# Patient Record
Sex: Male | Born: 1995 | Race: White | Hispanic: No | Marital: Single
Health system: Midwestern US, Community
[De-identification: ages and names within clinical notes are randomized; demographics above are authoritative.]

## PROBLEM LIST (undated history)

## (undated) DIAGNOSIS — F909 Attention-deficit hyperactivity disorder, unspecified type: Secondary | ICD-10-CM

## (undated) DIAGNOSIS — G43909 Migraine, unspecified, not intractable, without status migrainosus: Secondary | ICD-10-CM

## (undated) DIAGNOSIS — G039 Meningitis, unspecified: Secondary | ICD-10-CM

## (undated) DIAGNOSIS — K529 Noninfective gastroenteritis and colitis, unspecified: Secondary | ICD-10-CM

## (undated) DIAGNOSIS — F419 Anxiety disorder, unspecified: Secondary | ICD-10-CM

## (undated) DIAGNOSIS — I10 Essential (primary) hypertension: Secondary | ICD-10-CM

## (undated) DIAGNOSIS — M25512 Pain in left shoulder: Secondary | ICD-10-CM

## (undated) DIAGNOSIS — S0291XA Unspecified fracture of skull, initial encounter for closed fracture: Secondary | ICD-10-CM

## (undated) DIAGNOSIS — I609 Nontraumatic subarachnoid hemorrhage, unspecified: Secondary | ICD-10-CM

## (undated) HISTORY — DX: Nontraumatic subarachnoid hemorrhage, unspecified: I60.9

## (undated) HISTORY — PX: HERNIA REPAIR: SHX51

## (undated) HISTORY — DX: Noninfective gastroenteritis and colitis, unspecified: K52.9

## (undated) HISTORY — PX: CHOLECYSTECTOMY: SHX55

## (undated) HISTORY — DX: Anxiety disorder, unspecified: F41.9

## (undated) HISTORY — DX: Migraine, unspecified, not intractable, without status migrainosus: G43.909

## (undated) HISTORY — PX: APPENDECTOMY: SHX54

---
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
## 2015-04-12 ENCOUNTER — Encounter (HOSPITAL_COMMUNITY): Payer: Self-pay | Admitting: Emergency Medicine

## 2015-04-12 ENCOUNTER — Emergency Department (HOSPITAL_COMMUNITY): Payer: Medicaid Other

## 2015-04-12 ENCOUNTER — Emergency Department (HOSPITAL_COMMUNITY)
Admission: EM | Admit: 2015-04-12 | Discharge: 2015-04-12 | Disposition: A | Discharge status: Home / Self Care | Payer: Medicaid Other | Attending: Emergency Medicine | Admitting: Emergency Medicine

## 2015-04-12 DIAGNOSIS — R0789 Other chest pain: Secondary | ICD-10-CM | POA: Diagnosis not present

## 2015-04-12 DIAGNOSIS — Y929 Unspecified place or not applicable: Secondary | ICD-10-CM | POA: Diagnosis not present

## 2015-04-12 DIAGNOSIS — J45909 Unspecified asthma, uncomplicated: Secondary | ICD-10-CM | POA: Insufficient documentation

## 2015-04-12 DIAGNOSIS — Y939 Activity, unspecified: Secondary | ICD-10-CM | POA: Diagnosis not present

## 2015-04-12 DIAGNOSIS — I10 Essential (primary) hypertension: Secondary | ICD-10-CM | POA: Diagnosis not present

## 2015-04-12 DIAGNOSIS — Y999 Unspecified external cause status: Secondary | ICD-10-CM | POA: Insufficient documentation

## 2015-04-12 DIAGNOSIS — S299XXA Unspecified injury of thorax, initial encounter: Secondary | ICD-10-CM | POA: Diagnosis present

## 2015-04-12 LAB — D-DIMER, QUANTITATIVE: D-Dimer, Quant: <0.27 ug/mL-FEU (ref 0.00–0.50)

## 2015-04-12 MED ORDER — CYCLOBENZAPRINE HCL 10 MG PO TABS
10.0000 mg | ORAL_TABLET | Freq: Once | ORAL | Status: AC
Start: 2015-04-12 — End: 2015-04-12
  Administered 2015-04-12: 10 mg via ORAL
  Filled 2015-04-12: qty 1

## 2015-04-12 MED ORDER — NAPROXEN 500 MG PO TABS: 500.0000 mg | ORAL_TABLET | Freq: Two times a day (BID) | ORAL | Status: DC

## 2015-04-12 MED ORDER — TRAMADOL HCL 50 MG PO TABS: 50.0000 mg | ORAL_TABLET | Freq: Four times a day (QID) | ORAL | Status: DC | PRN

## 2015-04-12 MED ORDER — TRAMADOL HCL 50 MG PO TABS
50.0000 mg | ORAL_TABLET | Freq: Once | ORAL | Status: AC
  Administered 2015-04-12: 50 mg via ORAL
  Filled 2015-04-12: qty 1

## 2015-04-12 MED ORDER — CYCLOBENZAPRINE HCL 5 MG PO TABS: 5.0000 mg | ORAL_TABLET | Freq: Three times a day (TID) | ORAL | Status: DC | PRN

## 2015-04-12 NOTE — Discharge Instructions (Signed)
Chest Wall Pain Chest wall pain is pain in or around the bones and muscles of your chest. Sometimes, an injury causes this pain. Sometimes, the cause may not be known. This pain may take several weeks or longer to get better. HOME CARE INSTRUCTIONS  Pay attention to any changes in your symptoms. Take these actions to help with your pain:   Rest as told by your health care provider.   Avoid activities that cause pain. These include any activities that use your chest muscles or your abdominal and side muscles to lift heavy items.   If directed, apply ice to the painful area:  Put ice in a plastic bag.  Place a towel between your skin and the bag.  Leave the ice on for 20 minutes, 2-3 times per day.  Take over-the-counter and prescription medicines only as told by your health care provider.  Do not use tobacco products, including cigarettes, chewing tobacco, and e-cigarettes. If you need help quitting, ask your health care provider.  Keep all follow-up visits as told by your health care provider. This is important. SEEK MEDICAL CARE IF:  You have a fever.  Your chest pain becomes worse.  You have new symptoms. SEEK IMMEDIATE MEDICAL CARE IF:  You have nausea or vomiting.  You feel sweaty or light-headed.  You have a cough with phlegm (sputum) or you cough up blood.  You develop shortness of breath.   This information is not intended to replace advice given to you by your health care provider. Make sure you discuss any questions you have with your health care provider.   Document Released: 01/22/2005 Document Revised: 10/13/2014 Document Reviewed: 04/19/2014 Elsevier Interactive Patient Education 2016 [employer]   Use medications as prescribed.  Use caution with Ultram and Flexeril as these medicines and can make you sleepy.  Apply heating pad to chest wall 20 minutes 3-4 times daily.  Get rechecked by your primary doctor if this is not improving with today's  treatment.  Your x-rays and lab tests are normal today.

## 2015-04-12 NOTE — ED Provider Notes (Signed)
Medical screening examination/treatment/procedure(s) were performed by non-physician practitioner and as supervising physician I was immediately available for consultation/collaboration.   EKG Interpretation None     EKG:  Rhythm: SINUS TACHYCARDIA Rate: 132 PR:  132 ms QRS: 76 ms QTc: 432 ms ST segments: normal    Raeford RazorStephen Niomie Englert, MD 04/12/15 1208
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
## 2015-06-09 ENCOUNTER — Ambulatory Visit (INDEPENDENT_AMBULATORY_CARE_PROVIDER_SITE_OTHER): Payer: Medicaid Other | Admitting: Otolaryngology

## 2015-07-18 ENCOUNTER — Ambulatory Visit (INDEPENDENT_AMBULATORY_CARE_PROVIDER_SITE_OTHER): Payer: Medicaid Other | Admitting: Otolaryngology

## 2015-08-12 ENCOUNTER — Emergency Department (HOSPITAL_COMMUNITY): Payer: Medicaid Other

## 2015-08-12 ENCOUNTER — Encounter (HOSPITAL_COMMUNITY): Payer: Self-pay | Admitting: Emergency Medicine

## 2015-08-12 ENCOUNTER — Emergency Department (HOSPITAL_COMMUNITY)
Admission: EM | Admit: 2015-08-12 | Discharge: 2015-08-12 | Disposition: A | Discharge status: Home / Self Care | Payer: Medicaid Other | Attending: Emergency Medicine | Admitting: Emergency Medicine

## 2015-08-12 DIAGNOSIS — I10 Essential (primary) hypertension: Secondary | ICD-10-CM | POA: Diagnosis not present

## 2015-08-12 DIAGNOSIS — R0789 Other chest pain: Secondary | ICD-10-CM | POA: Diagnosis present

## 2015-08-12 DIAGNOSIS — J45909 Unspecified asthma, uncomplicated: Secondary | ICD-10-CM | POA: Diagnosis not present

## 2015-08-12 MED ORDER — IBUPROFEN 800 MG PO TABS
800.0000 mg | ORAL_TABLET | Freq: Once | ORAL | Status: AC
Start: 2015-08-12 — End: 2015-08-12
  Administered 2015-08-12: 800 mg via ORAL
  Filled 2015-08-12: qty 1

## 2015-08-12 MED ORDER — TRAMADOL HCL 50 MG PO TABS: 50.0000 mg | ORAL_TABLET | Freq: Four times a day (QID) | ORAL | Status: DC | PRN

## 2015-08-12 MED ORDER — NAPROXEN 500 MG PO TABS: 500.0000 mg | ORAL_TABLET | Freq: Two times a day (BID) | ORAL | Status: DC

## 2015-08-12 NOTE — ED Provider Notes (Signed)
CSN: [id number]     Arrival date & time 08/12/15  0301 History   None    Chief Complaint  Patient presents with  . Chest Pain     (Consider location/radiation/quality/duration/timing/severity/associated sxs/prior Treatment) Patient is a 20 y.o. male presenting with chest pain. The history is provided by the patient.  Chest Pain He has a history of asthma and hypertension. 2 days ago, he started complaining of pain in the right lateral rib cage. Pain is worse with palpation and with raising his right arm. He did not notice pain with deep breath. He denies any trauma or unusual activity. He had similar pain about 3 months ago which he was told was from inflamed cartilage. He applied ice which did give some slight relief. He has not taken any medication. He denies fever, chills, sweats. He denies cough. He denies dyspnea. Pain is currently rated at 8/10 although, pain was 10/10 yesterday.  Past Medical History  Diagnosis Date  . Asthma   . ADHD (attention deficit hyperactivity disorder)   . Left shoulder pain   . Meningitis   . Hypertension    Past Surgical History  Procedure Laterality Date  . Appendectomy    . Hernia repair     History reviewed. No pertinent family history. Social History  Substance Use Topics  . Smoking status: Never Smoker   . Smokeless tobacco: None  . Alcohol Use: No    Review of Systems  Cardiovascular: Positive for chest pain.  All other systems reviewed and are negative.     Allergies  Sulfa antibiotics  Home Medications   Prior to Admission medications   Medication Sig Start Date End Date Taking? Authorizing Provider  amoxicillin-clavulanate (AUGMENTIN) 875-125 MG per tablet Take 1 tablet by mouth 2 (two) times daily.    Historical Provider, MD  cephALEXin (KEFLEX) 500 MG capsule Take 500 mg by mouth 4 (four) times daily. Filled 09/27/12    Historical Provider, MD  cetirizine (ZYRTEC) 10 MG tablet Take 10 mg by mouth daily.    Historical  Provider, MD  cyclobenzaprine (FLEXERIL) 5 MG tablet Take 1 tablet (5 mg total) by mouth 3 (three) times daily as needed for muscle spasms. 04/12/15   Burgess AmorJulie Idol, PA-C  guanFACINE (INTUNIV) 2 MG TB24 Take 2 mg by mouth at bedtime.    Historical Provider, MD  HYDROcodone-acetaminophen (NORCO/VICODIN) 5-325 MG per tablet Take 1 tablet by mouth every 6 (six) hours as needed for moderate pain or severe pain. 10/01/13   Shon Batonourtney F Horton, MD  ibuprofen (ADVIL,MOTRIN) 200 MG tablet Take 600 mg by mouth every 6 (six) hours as needed. For pain    Historical Provider, MD  ibuprofen (ADVIL,MOTRIN) 600 MG tablet Take 600 mg by mouth every 6 (six) hours as needed for pain. Filled 09/27/12    Historical Provider, MD  ibuprofen (ADVIL,MOTRIN) 600 MG tablet Take 1 tablet (600 mg total) by mouth every 6 (six) hours as needed. 10/01/13   Shon Batonourtney F Horton, MD  lisinopril (PRINIVIL,ZESTRIL) 2.5 MG tablet Take 2.5 mg by mouth daily.    Historical Provider, MD  metoprolol (LOPRESSOR) 50 MG tablet Take 50 mg by mouth daily.    Historical Provider, MD  Multiple Vitamin (ONE-A-DAY MENS PO) Take 1 tablet by mouth daily.      Historical Provider, MD  naproxen (NAPROSYN) 500 MG tablet Take 1 tablet (500 mg total) by mouth 2 (two) times daily. 04/12/15   Burgess AmorJulie Idol, PA-C  traMADol (ULTRAM) 50 MG tablet Take  1 tablet (50 mg total) by mouth every 6 (six) hours as needed. 04/12/15   Burgess AmorJulie Idol, PA-C   BP 131/84 mmHg  Pulse 93  Temp(Src) 98.5 F (36.9 C) (Oral)  Resp 22  Ht 6' (1.829 m)  Wt 230 lb (104.327 kg)  BMI 31.19 kg/m2  SpO2 99% Physical Exam  Nursing note and vitals reviewed.  20 year old male, resting comfortably and in no acute distress. Vital signs are normal. Oxygen saturation is 99%, which is normal. Head is normocephalic and atraumatic. PERRLA, EOMI. Oropharynx is clear. Neck is nontender and supple without adenopathy or JVD. Back is nontender and there is no CVA tenderness. Lungs are clear without rales,  wheezes, or rhonchi. Chest is tender over the right lateral rib cage. Tenderness tracks along a single rib. There is no crepitus. Heart has regular rate and rhythm without murmur. Abdomen is soft, flat, nontender without masses or hepatosplenomegaly and peristalsis is normoactive. Extremities have no cyanosis or edema, full range of motion is present. Skin is warm and dry without rash. Neurologic: Mental status is normal, cranial nerves are intact, there are no motor or sensory deficits.  ED Course  Procedures (including critical care time)  Imaging Review Dg Ribs Unilateral W/chest Right  08/12/2015  CLINICAL DATA:  Acute onset of anterior and lateral right rib pain. Initial encounter. EXAM: RIGHT RIBS AND CHEST - 3+ VIEW COMPARISON:  Chest and right rib radiographs performed 04/12/2015 FINDINGS: No displaced rib fractures are seen. The lungs are well-aerated and clear. There is no evidence of focal opacification, pleural effusion or pneumothorax. The cardiomediastinal silhouette is within normal limits. No acute osseous abnormalities are seen. IMPRESSION: No acute cardiopulmonary process seen. No displaced rib fractures identified. Electronically Signed   By: Roanna RaiderJeffery  Chang M.D.   On: 08/12/2015 04:26   I have personally reviewed and evaluated these images as part of my medical decision-making.   EKG Interpretation   Date/Time:  Friday August 12 2015 03:13:52 EDT Ventricular Rate:  92 PR Interval:    QRS Duration: 91 QT Interval:  339 QTC Calculation: 420 R Axis:   69 Text Interpretation:  Sinus rhythm Normal ECG When compared with ECG of  04/12/2015, HEART RATE has decreased Confirmed by [facility]  MD, Jaxsin Bottomley ([id number])  on 08/12/2015 3:16:51 AM      MDM   Final diagnoses:  Right-sided chest wall pain    Chest wall pain. Old records are reviewed and he does have a prior ED visit for chest wall pain. He will be sent for rib x-rays.  X-rays are negative for fracture. He is discharged with  prescription for naproxen and an tramadol.  Dione Boozeavid Trenna Kiely, MD 08/12/15 [phone]

## 2015-08-12 NOTE — ED Notes (Signed)
EKG given to MD Preston FleetingGlick.

## 2015-08-12 NOTE — Discharge Instructions (Signed)
Chest Wall Pain Chest wall pain is pain in or around the bones and muscles of your chest. Sometimes, an injury causes this pain. Sometimes, the cause may not be known. This pain may take several weeks or longer to get better. HOME CARE INSTRUCTIONS  Pay attention to any changes in your symptoms. Take these actions to help with your pain:   Rest as told by your health care provider.   Avoid activities that cause pain. These include any activities that use your chest muscles or your abdominal and side muscles to lift heavy items.   If directed, apply ice to the painful area:  Put ice in a plastic bag.  Place a towel between your skin and the bag.  Leave the ice on for 20 minutes, 2-3 times per day.  Take over-the-counter and prescription medicines only as told by your health care provider.  Do not use tobacco products, including cigarettes, chewing tobacco, and e-cigarettes. If you need help quitting, ask your health care provider.  Keep all follow-up visits as told by your health care provider. This is important. SEEK MEDICAL CARE IF:  You have a fever.  Your chest pain becomes worse.  You have new symptoms. SEEK IMMEDIATE MEDICAL CARE IF:  You have nausea or vomiting.  You feel sweaty or light-headed.  You have a cough with phlegm (sputum) or you cough up blood.  You develop shortness of breath.   This information is not intended to replace advice given to you by your health care provider. Make sure you discuss any questions you have with your health care provider.   Document Released: 01/22/2005 Document Revised: 10/13/2014 Document Reviewed: 04/19/2014 Elsevier Interactive Patient Education 2016 [employer]  Naproxen and naproxen sodium oral immediate-release tablets What is this medicine? NAPROXEN (na PROX en) is a non-steroidal anti-inflammatory drug (NSAID). It is used to reduce swelling and to treat pain. This medicine may be used for dental pain, headache,  or painful monthly periods. It is also used for painful joint and muscular problems such as arthritis, tendinitis, bursitis, and gout. This medicine may be used for other purposes; ask your health care provider or pharmacist if you have questions. What should I tell my health care provider before I take this medicine? They need to know if you have any of these conditions: -asthma -cigarette smoker -drink more than 3 alcohol containing drinks a day -heart disease or circulation problems such as heart failure or leg edema (fluid retention) -high blood pressure -kidney disease -liver disease -stomach bleeding or ulcers -an unusual or allergic reaction to naproxen, aspirin, other NSAIDs, other medicines, foods, dyes, or preservatives -pregnant or trying to get pregnant -breast-feeding How should I use this medicine? Take this medicine by mouth with a glass of water. Follow the directions on the prescription label. Take it with food if your stomach gets upset. Try to not lie down for at least 10 minutes after you take it. Take your medicine at regular intervals. Do not take your medicine more often than directed. Long-term, continuous use may increase the risk of heart attack or stroke. A special MedGuide will be given to you by the pharmacist with each prescription and refill. Be sure to read this information carefully each time. Talk to your pediatrician regarding the use of this medicine in children. Special care may be needed. Overdosage: If you think you have taken too much of this medicine contact a poison control center or emergency room at once. NOTE: This medicine is  only for you. Do not share this medicine with others. What if I miss a dose? If you miss a dose, take it as soon as you can. If it is almost time for your next dose, take only that dose. Do not take double or extra doses. What may interact with this  medicine? -alcohol -aspirin -cidofovir -diuretics -lithium -methotrexate -other drugs for inflammation like ketorolac or prednisone -pemetrexed -probenecid -warfarin This list may not describe all possible interactions. Give your health care provider a list of all the medicines, herbs, non-prescription drugs, or dietary supplements you use. Also tell them if you smoke, drink alcohol, or use illegal drugs. Some items may interact with your medicine. What should I watch for while using this medicine? Tell your doctor or health care professional if your pain does not get better. Talk to your doctor before taking another medicine for pain. Do not treat yourself. This medicine does not prevent heart attack or stroke. In fact, this medicine may increase the chance of a heart attack or stroke. The chance may increase with longer use of this medicine and in people who have heart disease. If you take aspirin to prevent heart attack or stroke, talk with your doctor or health care professional. Do not take other medicines that contain aspirin, ibuprofen, or naproxen with this medicine. Side effects such as stomach upset, nausea, or ulcers may be more likely to occur. Many medicines available without a prescription should not be taken with this medicine. This medicine can cause ulcers and bleeding in the stomach and intestines at any time during treatment. Do not smoke cigarettes or drink alcohol. These increase irritation to your stomach and can make it more susceptible to damage from this medicine. Ulcers and bleeding can happen without warning symptoms and can cause death. You may get drowsy or dizzy. Do not drive, use machinery, or do anything that needs mental alertness until you know how this medicine affects you. Do not stand or sit up quickly, especially if you are an older patient. This reduces the risk of dizzy or fainting spells. This medicine can cause you to bleed more easily. Try to avoid damage  to your teeth and gums when you brush or floss your teeth. What side effects may I notice from receiving this medicine? Side effects that you should report to your doctor or health care professional as soon as possible: -black or bloody stools, blood in the urine or vomit -blurred vision -chest pain -difficulty breathing or wheezing -nausea or vomiting -severe stomach pain -skin rash, skin redness, blistering or peeling skin, hives, or itching -slurred speech or weakness on one side of the body -swelling of eyelids, throat, lips -unexplained weight gain or swelling -unusually weak or tired -yellowing of eyes or skin Side effects that usually do not require medical attention (report to your doctor or health care professional if they continue or are bothersome): -constipation -headache -heartburn This list may not describe all possible side effects. Call your doctor for medical advice about side effects. You may report side effects to FDA at 1-800-FDA-1088. Where should I keep my medicine? Keep out of the reach of children. Store at room temperature between 15 and 30 degrees C (59 and 86 degrees F). Keep container tightly closed. Throw away any unused medicine after the expiration date. NOTE: This sheet is a summary. It may not cover all possible information. If you have questions about this medicine, talk to your doctor, pharmacist, or health care provider.  2016, Elsevier/Gold Standard. (2009-01-24 20:10:16)  Tramadol tablets What is this medicine? TRAMADOL (TRA ma dole) is a pain reliever. It is used to treat moderate to severe pain in adults. This medicine may be used for other purposes; ask your health care provider or pharmacist if you have questions. What should I tell my health care provider before I take this medicine? They need to know if you have any of these conditions: -brain tumor -depression -drug abuse or addiction -head injury -if you frequently drink alcohol  containing drinks -kidney disease or trouble passing urine -liver disease -lung disease, asthma, or breathing problems -seizures or epilepsy -suicidal thoughts, plans, or attempt; a previous suicide attempt by you or a family member -an unusual or allergic reaction to tramadol, codeine, other medicines, foods, dyes, or preservatives -pregnant or trying to get pregnant -breast-feeding How should I use this medicine? Take this medicine by mouth with a full glass of water. Follow the directions on the prescription label. If the medicine upsets your stomach, take it with food or milk. Do not take more medicine than you are told to take. Talk to your pediatrician regarding the use of this medicine in children. Special care may be needed. Overdosage: If you think you have taken too much of this medicine contact a poison control center or emergency room at once. NOTE: This medicine is only for you. Do not share this medicine with others. What if I miss a dose? If you miss a dose, take it as soon as you can. If it is almost time for your next dose, take only that dose. Do not take double or extra doses. What may interact with this medicine? Do not take this medicine with any of the following medications: -MAOIs like Carbex, Eldepryl, Marplan, Nardil, and Parnate This medicine may also interact with the following medications: -alcohol or medicines that contain alcohol -antihistamines -benzodiazepines -bupropion -carbamazepine or oxcarbazepine -clozapine -cyclobenzaprine -digoxin -furazolidone -linezolid -medicines for depression, anxiety, or psychotic disturbances -medicines for migraine headache like almotriptan, eletriptan, frovatriptan, naratriptan, rizatriptan, sumatriptan, zolmitriptan -medicines for pain like pentazocine, buprenorphine, butorphanol, meperidine, nalbuphine, and propoxyphene -medicines for sleep -muscle relaxants -naltrexone -phenobarbital -phenothiazines like  perphenazine, thioridazine, chlorpromazine, mesoridazine, fluphenazine, prochlorperazine, promazine, and trifluoperazine -procarbazine -warfarin This list may not describe all possible interactions. Give your health care provider a list of all the medicines, herbs, non-prescription drugs, or dietary supplements you use. Also tell them if you smoke, drink alcohol, or use illegal drugs. Some items may interact with your medicine. What should I watch for while using this medicine? Tell your doctor or health care professional if your pain does not go away, if it gets worse, or if you have new or a different type of pain. You may develop tolerance to the medicine. Tolerance means that you will need a higher dose of the medicine for pain relief. Tolerance is normal and is expected if you take this medicine for a long time. Do not suddenly stop taking your medicine because you may develop a severe reaction. Your body becomes used to the medicine. This does NOT mean you are addicted. Addiction is a behavior related to getting and using a drug for a non-medical reason. If you have pain, you have a medical reason to take pain medicine. Your doctor will tell you how much medicine to take. If your doctor wants you to stop the medicine, the dose will be slowly lowered over time to avoid any side effects. You may get drowsy or dizzy. Do  not drive, use machinery, or do anything that needs mental alertness until you know how this medicine affects you. Do not stand or sit up quickly, especially if you are an older patient. This reduces the risk of dizzy or fainting spells. Alcohol can increase or decrease the effects of this medicine. Avoid alcoholic drinks. You may have constipation. Try to have a bowel movement at least every 2 to 3 days. If you do not have a bowel movement for 3 days, call your doctor or health care professional. Your mouth may get dry. Chewing sugarless gum or sucking hard candy, and drinking plenty of  water may help. Contact your doctor if the problem does not go away or is severe. What side effects may I notice from receiving this medicine? Side effects that you should report to your doctor or health care professional as soon as possible: -allergic reactions like skin rash, itching or hives, swelling of the face, lips, or tongue -breathing difficulties, wheezing -confusion -itching -light headedness or fainting spells -redness, blistering, peeling or loosening of the skin, including inside the mouth -seizures Side effects that usually do not require medical attention (report to your doctor or health care professional if they continue or are bothersome): -constipation -dizziness -drowsiness -headache -nausea, vomiting This list may not describe all possible side effects. Call your doctor for medical advice about side effects. You may report side effects to FDA at 1-800-FDA-1088. Where should I keep my medicine? Keep out of the reach of children. This medicine may cause accidental overdose and death if it taken by other adults, children, or pets. Mix any unused medicine with a substance like cat litter or coffee grounds. Then throw the medicine away in a sealed container like a sealed bag or a coffee can with a lid. Do not use the medicine after the expiration date. Store at room temperature between 15 and 30 degrees C (59 and 86 degrees F). NOTE: This sheet is a summary. It may not cover all possible information. If you have questions about this medicine, talk to your doctor, pharmacist, or health care provider.    2016, Elsevier/Gold Standard. (2013-03-20 15:42:09)

## 2015-08-12 NOTE — ED Notes (Signed)
Pt reports right side rib cage pain, stated right side CP earlier, denies SOB. States he was seen for the same sx several months ago.

## 2015-09-07 ENCOUNTER — Emergency Department (HOSPITAL_COMMUNITY): Payer: Medicaid Other

## 2015-09-07 ENCOUNTER — Emergency Department (HOSPITAL_COMMUNITY)
Admission: EM | Admit: 2015-09-07 | Discharge: 2015-09-07 | Disposition: A | Discharge status: Home / Self Care | Payer: Medicaid Other | Attending: Emergency Medicine | Admitting: Emergency Medicine

## 2015-09-07 ENCOUNTER — Encounter (HOSPITAL_COMMUNITY): Payer: Self-pay | Admitting: Emergency Medicine

## 2015-09-07 DIAGNOSIS — R05 Cough: Secondary | ICD-10-CM | POA: Insufficient documentation

## 2015-09-07 DIAGNOSIS — J45909 Unspecified asthma, uncomplicated: Secondary | ICD-10-CM | POA: Insufficient documentation

## 2015-09-07 DIAGNOSIS — I10 Essential (primary) hypertension: Secondary | ICD-10-CM | POA: Insufficient documentation

## 2015-09-07 DIAGNOSIS — Z79899 Other long term (current) drug therapy: Secondary | ICD-10-CM | POA: Insufficient documentation

## 2015-09-07 DIAGNOSIS — R059 Cough, unspecified: Secondary | ICD-10-CM

## 2015-09-07 MED ORDER — BENZONATATE 100 MG PO CAPS: 100.0000 mg | ORAL_CAPSULE | Freq: Three times a day (TID) | ORAL | 0 refills | Status: DC | PRN

## 2015-09-07 MED ORDER — PREDNISONE 20 MG PO TABS: 40.0000 mg | ORAL_TABLET | Freq: Every day | ORAL | 0 refills | Status: DC

## 2015-09-07 MED ORDER — ALBUTEROL SULFATE HFA 108 (90 BASE) MCG/ACT IN AERS: 2.0000 | INHALATION_SPRAY | RESPIRATORY_TRACT | 0 refills | Status: DC | PRN

## 2015-09-07 NOTE — ED Triage Notes (Signed)
Pt c/o cough and congestion x 2 weeks. Pt has been seen at [facility] for the same.

## 2015-09-07 NOTE — Discharge Instructions (Signed)
Take the prescriptions as directed.  Use your albuterol inhaler (2 to 4 puffs) every 4 hours for the next 7 days, then as needed for cough, wheezing, or shortness of breath.  Call your regular medical doctor tomorrow morning to schedule a follow up appointment within the next 3 days.  Return to the Emergency Department immediately sooner if worsening.  ° °

## 2015-09-07 NOTE — ED Provider Notes (Signed)
[facility]-EMERGENCY DEPT Provider Note   CSN: [id number] Arrival date & time: 09/07/15  1935  First Provider Contact:  None       History   Chief Complaint Chief Complaint  Patient presents with  . Cough    HPI Logan Dawson is a 20 y.o. male.  HPI  Pt was seen at 1956.  Per pt, c/o gradual onset and persistence of constant runny/stuffy nose, sinus congestion, and cough for the past 2 weeks. States he has been evaluated at [facility] ED for same, dx URI, rx "cough syrup." Pt states "it's not working."  Denies sore throat, no fevers, no rash, no CP/SOB, no N/V/D, no abd pain.    Past Medical History:  Diagnosis Date  . ADHD (attention deficit hyperactivity disorder)   . Asthma   . Hypertension   . Left shoulder pain   . Meningitis     There are no active problems to display for this patient.   Past Surgical History:  Procedure Laterality Date  . APPENDECTOMY    . HERNIA REPAIR         Home Medications    Prior to Admission medications   Medication Sig Start Date End Date Taking? Authorizing Provider  amoxicillin-clavulanate (AUGMENTIN) 875-125 MG per tablet Take 1 tablet by mouth 2 (two) times daily.    Historical Provider, MD  cephALEXin (KEFLEX) 500 MG capsule Take 500 mg by mouth 4 (four) times daily. Filled 09/27/12    Historical Provider, MD  cetirizine (ZYRTEC) 10 MG tablet Take 10 mg by mouth daily.    Historical Provider, MD  cyclobenzaprine (FLEXERIL) 5 MG tablet Take 1 tablet (5 mg total) by mouth 3 (three) times daily as needed for muscle spasms. 04/12/15   Burgess Amor, PA-C  guanFACINE (INTUNIV) 2 MG TB24 Take 2 mg by mouth at bedtime.    Historical Provider, MD  HYDROcodone-acetaminophen (NORCO/VICODIN) 5-325 MG per tablet Take 1 tablet by mouth every 6 (six) hours as needed for moderate pain or severe pain. 10/01/13   Shon Baton, MD  ibuprofen (ADVIL,MOTRIN) 200 MG tablet Take 600 mg by mouth every 6 (six) hours as needed. For pain    Historical  Provider, MD  ibuprofen (ADVIL,MOTRIN) 600 MG tablet Take 600 mg by mouth every 6 (six) hours as needed for pain. Filled 09/27/12    Historical Provider, MD  ibuprofen (ADVIL,MOTRIN) 600 MG tablet Take 1 tablet (600 mg total) by mouth every 6 (six) hours as needed. 10/01/13   Shon Baton, MD  lisinopril (PRINIVIL,ZESTRIL) 2.5 MG tablet Take 2.5 mg by mouth daily.    Historical Provider, MD  metoprolol (LOPRESSOR) 50 MG tablet Take 50 mg by mouth daily.    Historical Provider, MD  Multiple Vitamin (ONE-A-DAY MENS PO) Take 1 tablet by mouth daily.      Historical Provider, MD  naproxen (NAPROSYN) 500 MG tablet Take 1 tablet (500 mg total) by mouth 2 (two) times daily. 08/12/15   Dione Booze, MD  traMADol (ULTRAM) 50 MG tablet Take 1 tablet (50 mg total) by mouth every 6 (six) hours as needed. 08/12/15   Dione Booze, MD    Family History   Social History Social History  Substance Use Topics  . Smoking status: Never Smoker  . Smokeless tobacco: Never Used  . Alcohol use Yes     Allergies   Sulfa antibiotics   Review of Systems Review of Systems ROS: Statement: All systems negative except as marked or noted in the  HPI; Constitutional: Negative for fever and chills. ; ; Eyes: Negative for eye pain, redness and discharge. ; ; ENMT: Negative for ear pain, hoarseness, sore throat. +nasal congestion, rhinorrhea, sinus pressure. ; ; Cardiovascular: Negative for chest pain, palpitations, diaphoresis, dyspnea and peripheral edema. ; ; Respiratory: +cough. Negative for wheezing and stridor. ; ; Gastrointestinal: Negative for nausea, vomiting, diarrhea, abdominal pain, blood in stool, hematemesis, jaundice and rectal bleeding. . ; ; Genitourinary: Negative for dysuria, flank pain and hematuria. ; ; Musculoskeletal: Negative for back pain and neck pain. Negative for swelling and trauma.; ; Skin: Negative for pruritus, rash, abrasions, blisters, bruising and skin lesion.; ; Neuro: Negative for headache,  lightheadedness and neck stiffness. Negative for weakness, altered level of consciousness, altered mental status, extremity weakness, paresthesias, involuntary movement, seizure and syncope.       Physical Exam Updated Vital Signs BP 131/79 (BP Location: Left Arm)   Pulse 89   Temp 98.2 F (36.8 C) (Oral)   Resp 20   Ht 6' (1.829 m)   Wt 226 lb (102.5 kg)   SpO2 98%   BMI 30.65 kg/m   Physical Exam 2000: Physical examination:  Nursing notes reviewed; Vital signs and O2 SAT reviewed;  Constitutional: Well developed, Well nourished, Well hydrated, In no acute distress; Head:  Normocephalic, atraumatic; Eyes: EOMI, PERRL, No scleral icterus; ENMT: TM's clear bilat. +edemetous nasal turbinates bilat with clear rhinorrhea. Mouth and pharynx without lesions. No tonsillar exudates. No intra-oral edema. No submandibular or sublingual edema. No hoarse voice, no drooling, no stridor. No pain with manipulation of larynx. No trismus. Mouth and pharynx normal, Mucous membranes moist; Neck: Supple, Full range of motion, No lymphadenopathy; Cardiovascular: Regular rate and rhythm, No gallop; Respiratory: Breath sounds diminished & equal bilaterally, No wheezes.  Speaking full sentences with ease, Normal respiratory effort/excursion; Chest: Nontender, Movement normal; Abdomen: Soft, Nontender, Nondistended, Normal bowel sounds; Genitourinary: No CVA tenderness; Extremities: Pulses normal, No tenderness, No edema, No calf edema or asymmetry.; Neuro: AA&Ox3, Major CN grossly intact.  Speech clear. No gross focal motor or sensory deficits in extremities.; Skin: Color normal, Warm, Dry.   ED Treatments / Results  Labs (all labs ordered are listed, but only abnormal results are displayed)   EKG  EKG Interpretation None       Radiology   Procedures Procedures (including critical care time)  Medications Ordered in ED Medications - No data to display   Initial Impression / Assessment and Plan /  ED Course  I have reviewed the triage vital signs and the nursing notes.  Pertinent labs & imaging results that were available during my care of the patient were reviewed by me and considered in my medical decision making (see chart for details).  MDM Reviewed: previous chart, nursing note and vitals Interpretation: x-ray   Dg Chest 2 View Result Date: 09/07/2015 CLINICAL DATA:  20 year old male with cough and congestion for 2 weeks. Subsequent encounter. EXAM: CHEST  2 VIEW COMPARISON:  Chest radiograph 08/12/2015 and earlier. FINDINGS: Stable lung volumes, low normal. Normal cardiac size and mediastinal contours. Visualized tracheal air column is within normal limits. The lungs are clear. No pneumothorax or pleural effusion. Incidental air-fluid level in the stomach. Negative visible bowel gas pattern. No acute osseous abnormality identified. IMPRESSION: Negative, no acute cardiopulmonary abnormality. Electronically Signed   By: Odessa Fleming M.D.   On: 09/07/2015 20:15    2020:  Tx symptomatically at this time. Dx and testing d/w pt.  Questions answered.  Verb understanding, agreeable to d/c home with outpt f/u.   Final Clinical Impressions(s) / ED Diagnoses   Final diagnoses:  None    New Prescriptions New Prescriptions   No medications on file     Samuel Jester, DO 09/11/15 1403

## 2015-12-27 ENCOUNTER — Encounter (HOSPITAL_COMMUNITY): Payer: Self-pay | Admitting: Emergency Medicine

## 2015-12-27 ENCOUNTER — Emergency Department (HOSPITAL_COMMUNITY): Payer: Self-pay

## 2015-12-27 ENCOUNTER — Emergency Department (HOSPITAL_COMMUNITY)
Admission: EM | Admit: 2015-12-27 | Discharge: 2015-12-27 | Disposition: A | Discharge status: Home / Self Care | Payer: Self-pay | Attending: Emergency Medicine | Admitting: Emergency Medicine

## 2015-12-27 DIAGNOSIS — I10 Essential (primary) hypertension: Secondary | ICD-10-CM | POA: Insufficient documentation

## 2015-12-27 DIAGNOSIS — F909 Attention-deficit hyperactivity disorder, unspecified type: Secondary | ICD-10-CM | POA: Insufficient documentation

## 2015-12-27 DIAGNOSIS — M7551 Bursitis of right shoulder: Secondary | ICD-10-CM | POA: Insufficient documentation

## 2015-12-27 DIAGNOSIS — Z79899 Other long term (current) drug therapy: Secondary | ICD-10-CM | POA: Insufficient documentation

## 2015-12-27 DIAGNOSIS — Y990 Civilian activity done for income or pay: Secondary | ICD-10-CM | POA: Insufficient documentation

## 2015-12-27 DIAGNOSIS — J45909 Unspecified asthma, uncomplicated: Secondary | ICD-10-CM | POA: Insufficient documentation

## 2015-12-27 DIAGNOSIS — Y9389 Activity, other specified: Secondary | ICD-10-CM | POA: Insufficient documentation

## 2015-12-27 DIAGNOSIS — X500XXA Overexertion from strenuous movement or load, initial encounter: Secondary | ICD-10-CM | POA: Insufficient documentation

## 2015-12-27 DIAGNOSIS — Y929 Unspecified place or not applicable: Secondary | ICD-10-CM | POA: Insufficient documentation

## 2015-12-27 MED ORDER — CYCLOBENZAPRINE HCL 10 MG PO TABS
10.0000 mg | ORAL_TABLET | Freq: Once | ORAL | Status: AC
  Administered 2015-12-27: 10 mg via ORAL
  Filled 2015-12-27: qty 1

## 2015-12-27 MED ORDER — HYDROCODONE-ACETAMINOPHEN 5-325 MG PO TABS
1.0000 | ORAL_TABLET | Freq: Once | ORAL | Status: AC
  Administered 2015-12-27: 1 via ORAL
  Filled 2015-12-27: qty 1

## 2015-12-27 MED ORDER — CYCLOBENZAPRINE HCL 10 MG PO TABS: 10.0000 mg | ORAL_TABLET | Freq: Three times a day (TID) | ORAL | 0 refills | Status: DC | PRN

## 2015-12-27 MED ORDER — HYDROCODONE-ACETAMINOPHEN 5-325 MG PO TABS: ORAL_TABLET | ORAL | 0 refills | Status: DC

## 2015-12-27 NOTE — Discharge Instructions (Signed)
Apply ice packs on/off to your shoulder.  Continue taking your ibuprofen as directed.  Call the orthopedic doctor listed to arrange a follow-up appt in one week if not improving

## 2015-12-27 NOTE — ED Triage Notes (Signed)
Pt states he began having shoulder pain "about 4 months ago". States that he cannot lift/push/pull anything without excruciating pain. Pt states this is not due to a recent injury that he is aware of.

## 2015-12-27 NOTE — ED Provider Notes (Signed)
[facility]-EMERGENCY DEPT Provider Note   CSN: [id number] Arrival date & time: 12/27/15  2120     History   Chief Complaint Chief Complaint  Patient presents with  . Shoulder Pain    HPI Logan Dawson is a 20 y.o. male.  HPI   Logan Dawson is a 20 y.o. male who presents to the Emergency Department complaining of gradual onset of right shoulder pain.  Onset 4 days ago. He describes a sharp pain associated with movement, primarily reaching or lifting with the right arm.  Pain resolves at rest.  He has been doing a new repetitive movement at his job recently and carrying a heavy weight resting it on his right shoulder.  He has tried OTC analgesics, ice and heat without relief.  He denies chest pain, shortness of breath, neck pain, numbness or weakness of the extremity.     Past Medical History:  Diagnosis Date  . ADHD (attention deficit hyperactivity disorder)   . Asthma   . Hypertension   . Left shoulder pain   . Meningitis     There are no active problems to display for this patient.   Past Surgical History:  Procedure Laterality Date  . APPENDECTOMY    . HERNIA REPAIR         Home Medications    Prior to Admission medications   Medication Sig Start Date End Date Taking? Authorizing Provider  albuterol (PROVENTIL HFA;VENTOLIN HFA) 108 (90 Base) MCG/ACT inhaler Inhale 2 puffs into the lungs every 4 (four) hours as needed for wheezing or shortness of breath. 09/07/15   Samuel JesterKathleen McManus, DO  amoxicillin-clavulanate (AUGMENTIN) 875-125 MG per tablet Take 1 tablet by mouth 2 (two) times daily.    Historical Provider, MD  benzonatate (TESSALON) 100 MG capsule Take 1 capsule (100 mg total) by mouth 3 (three) times daily as needed for cough. 09/07/15   Samuel JesterKathleen McManus, DO  cephALEXin (KEFLEX) 500 MG capsule Take 500 mg by mouth 4 (four) times daily. Filled 09/27/12    Historical Provider, MD  cetirizine (ZYRTEC) 10 MG tablet Take 10 mg by mouth daily.    Historical Provider, MD    cyclobenzaprine (FLEXERIL) 5 MG tablet Take 1 tablet (5 mg total) by mouth 3 (three) times daily as needed for muscle spasms. 04/12/15   Burgess AmorJulie Idol, PA-C  guanFACINE (INTUNIV) 2 MG TB24 Take 2 mg by mouth at bedtime.    Historical Provider, MD  HYDROcodone-acetaminophen (NORCO/VICODIN) 5-325 MG per tablet Take 1 tablet by mouth every 6 (six) hours as needed for moderate pain or severe pain. 10/01/13   Shon Batonourtney F Horton, MD  ibuprofen (ADVIL,MOTRIN) 200 MG tablet Take 600 mg by mouth every 6 (six) hours as needed. For pain    Historical Provider, MD  ibuprofen (ADVIL,MOTRIN) 600 MG tablet Take 600 mg by mouth every 6 (six) hours as needed for pain. Filled 09/27/12    Historical Provider, MD  ibuprofen (ADVIL,MOTRIN) 600 MG tablet Take 1 tablet (600 mg total) by mouth every 6 (six) hours as needed. 10/01/13   Shon Batonourtney F Horton, MD  lisinopril (PRINIVIL,ZESTRIL) 2.5 MG tablet Take 2.5 mg by mouth daily.    Historical Provider, MD  metoprolol (LOPRESSOR) 50 MG tablet Take 50 mg by mouth daily.    Historical Provider, MD  Multiple Vitamin (ONE-A-DAY MENS PO) Take 1 tablet by mouth daily.      Historical Provider, MD  naproxen (NAPROSYN) 500 MG tablet Take 1 tablet (500 mg total) by mouth 2 (  two) times daily. 08/12/15   Dione Boozeavid Glick, MD  predniSONE (DELTASONE) 20 MG tablet Take 2 tablets (40 mg total) by mouth daily. 09/07/15   Samuel JesterKathleen McManus, DO  traMADol (ULTRAM) 50 MG tablet Take 1 tablet (50 mg total) by mouth every 6 (six) hours as needed. 08/12/15   Dione Boozeavid Glick, MD    Family History No family history on file.  Social History Social History  Substance Use Topics  . Smoking status: Never Smoker  . Smokeless tobacco: Never Used  . Alcohol use Yes     Allergies   Sulfa antibiotics   Review of Systems Review of Systems  Constitutional: Negative for chills and fever.  Respiratory: Negative for chest tightness and shortness of breath.   Cardiovascular: Negative for chest pain.  Musculoskeletal:  Positive for arthralgias (right shoulde rpain). Negative for joint swelling and neck pain.  Skin: Negative for color change and wound.  Neurological: Negative for weakness, numbness and headaches.  All other systems reviewed and are negative.    Physical Exam Updated Vital Signs BP 134/92 (BP Location: Left Arm)   Pulse 101   Temp 97.6 F (36.4 C) (Oral)   Resp 19   Ht 6' (1.829 m)   Wt 102.1 kg   SpO2 97%   BMI 30.52 kg/m   Physical Exam  Constitutional: He appears well-developed and well-nourished. No distress.  HENT:  Head: Atraumatic.  Neck: Normal range of motion. Neck supple.  Cardiovascular: Normal rate, regular rhythm and intact distal pulses.   Pulmonary/Chest: Effort normal. No respiratory distress. He exhibits no tenderness.  Musculoskeletal: He exhibits tenderness. He exhibits no edema.       Right shoulder: He exhibits tenderness. He exhibits no bony tenderness, no swelling, no effusion, no deformity, normal pulse and normal strength.       Arms: ttp of the anterior right shoulder.  limited abduction of the arm due to level of pain.  No erythema or edema.  Grip strength is strong and symmetrical.  No tenderness of the C spine  Neurological: He is alert. No sensory deficit.  Skin: Skin is warm and dry. No rash noted.  Nursing note and vitals reviewed.    ED Treatments / Results  Labs (all labs ordered are listed, but only abnormal results are displayed) Labs Reviewed - No data to display  EKG  EKG Interpretation None       Radiology No results found.  Procedures Procedures (including critical care time)  Medications Ordered in ED Medications  cyclobenzaprine (FLEXERIL) tablet 10 mg (not administered)  HYDROcodone-acetaminophen (NORCO/VICODIN) 5-325 MG per tablet 1 tablet (not administered)     Initial Impression / Assessment and Plan / ED Course  I have reviewed the triage vital signs and the nursing notes.  Pertinent labs & imaging results  that were available during my care of the patient were reviewed by me and considered in my medical decision making (see chart for details).  Clinical Course     Pain to right shoulder with onset after repetitive movements.  No concerning sx's for infectious process.  NV intact. Likely inflammatory   Pt agrees to symptomatic tx and orthopedic f/u if needed.   Final Clinical Impressions(s) / ED Diagnoses   Final diagnoses:  Bursitis of right shoulder    New Prescriptions New Prescriptions   No medications on file     Rosey Bathammy Vera Furniss, PA-C 12/27/15 2333    Mancel BaleElliott Wentz, MD 12/27/15 [phone]

## 2016-05-09 ENCOUNTER — Emergency Department (HOSPITAL_COMMUNITY)
Admission: EM | Admit: 2016-05-09 | Discharge: 2016-05-09 | Disposition: A | Discharge status: Home / Self Care | Payer: Medicaid Other | Attending: Dermatology | Admitting: Dermatology

## 2016-05-09 ENCOUNTER — Encounter (HOSPITAL_COMMUNITY): Payer: Self-pay | Admitting: *Deleted

## 2016-05-09 DIAGNOSIS — Z5321 Procedure and treatment not carried out due to patient leaving prior to being seen by health care provider: Secondary | ICD-10-CM | POA: Insufficient documentation

## 2016-05-09 DIAGNOSIS — Z79899 Other long term (current) drug therapy: Secondary | ICD-10-CM | POA: Insufficient documentation

## 2016-05-09 DIAGNOSIS — R109 Unspecified abdominal pain: Secondary | ICD-10-CM | POA: Insufficient documentation

## 2016-05-09 DIAGNOSIS — F909 Attention-deficit hyperactivity disorder, unspecified type: Secondary | ICD-10-CM | POA: Insufficient documentation

## 2016-05-09 DIAGNOSIS — J45909 Unspecified asthma, uncomplicated: Secondary | ICD-10-CM | POA: Insufficient documentation

## 2016-05-09 DIAGNOSIS — I10 Essential (primary) hypertension: Secondary | ICD-10-CM | POA: Insufficient documentation

## 2016-05-09 LAB — CBC
HCT: 48.1 % (ref 39.0–52.0)
Hemoglobin: 17.5 g/dL — ABNORMAL HIGH (ref 13.0–17.0)
MCH: 31.6 pg (ref 26.0–34.0)
MCHC: 36.4 g/dL — ABNORMAL HIGH (ref 30.0–36.0)
MCV: 86.8 fL (ref 78.0–100.0)
Platelets: 263 10*3/uL (ref 150–400)
RBC: 5.54 MIL/uL (ref 4.22–5.81)
RDW: 12.6 % (ref 11.5–15.5)
WBC: 11.7 10*3/uL — ABNORMAL HIGH (ref 4.0–10.5)

## 2016-05-09 LAB — COMPREHENSIVE METABOLIC PANEL
ALT: 49 U/L (ref 17–63)
AST: 67 U/L — ABNORMAL HIGH (ref 15–41)
Albumin: 4.5 g/dL (ref 3.5–5.0)
Alkaline Phosphatase: 84 U/L (ref 38–126)
Anion gap: 9 (ref 5–15)
BUN: 10 mg/dL (ref 6–20)
CO2: 30 mmol/L (ref 22–32)
Calcium: 9.5 mg/dL (ref 8.9–10.3)
Chloride: 98 mmol/L — ABNORMAL LOW (ref 101–111)
Creatinine, Ser: 1.07 mg/dL (ref 0.61–1.24)
GFR calc Af Amer: >60 mL/min (ref >60)
GFR calc non Af Amer: >60 mL/min (ref >60)
Glucose, Bld: 114 mg/dL — ABNORMAL HIGH (ref 65–99)
Potassium: 4 mmol/L (ref 3.5–5.1)
Sodium: 137 mmol/L (ref 135–145)
Total Bilirubin: 0.4 mg/dL (ref 0.3–1.2)
Total Protein: 8.2 g/dL — ABNORMAL HIGH (ref 6.5–8.1)

## 2016-05-09 LAB — LIPASE, BLOOD: Lipase: 31 U/L (ref 11–51)

## 2016-05-09 NOTE — ED Notes (Signed)
No answer in waiting room,  

## 2016-05-09 NOTE — ED Notes (Signed)
No answer in waiting areas.  Registration clerk states patient went outside and has not come back.

## 2016-05-09 NOTE — ED Triage Notes (Signed)
Pt comes in for right side abdominal pain. Pt has had vomiting. Denies any diarrhea or constipation. Last BM today. Pt states he has hx of hernia and states this feels the same.

## 2016-05-22 ENCOUNTER — Emergency Department (HOSPITAL_COMMUNITY): Payer: Self-pay

## 2016-05-22 ENCOUNTER — Encounter (HOSPITAL_COMMUNITY): Payer: Self-pay | Admitting: Emergency Medicine

## 2016-05-22 ENCOUNTER — Emergency Department (HOSPITAL_COMMUNITY)
Admission: EM | Admit: 2016-05-22 | Discharge: 2016-05-22 | Disposition: A | Discharge status: Home / Self Care | Payer: Self-pay | Attending: Emergency Medicine | Admitting: Emergency Medicine

## 2016-05-22 DIAGNOSIS — K8050 Calculus of bile duct without cholangitis or cholecystitis without obstruction: Secondary | ICD-10-CM | POA: Insufficient documentation

## 2016-05-22 DIAGNOSIS — I10 Essential (primary) hypertension: Secondary | ICD-10-CM | POA: Insufficient documentation

## 2016-05-22 DIAGNOSIS — R1011 Right upper quadrant pain: Secondary | ICD-10-CM

## 2016-05-22 DIAGNOSIS — F909 Attention-deficit hyperactivity disorder, unspecified type: Secondary | ICD-10-CM | POA: Insufficient documentation

## 2016-05-22 DIAGNOSIS — J45909 Unspecified asthma, uncomplicated: Secondary | ICD-10-CM | POA: Insufficient documentation

## 2016-05-22 LAB — LIPASE, BLOOD: Lipase: 31 U/L (ref 11–51)

## 2016-05-22 LAB — COMPREHENSIVE METABOLIC PANEL
ALT: 36 U/L (ref 17–63)
AST: 58 U/L — ABNORMAL HIGH (ref 15–41)
Albumin: 4.1 g/dL (ref 3.5–5.0)
Alkaline Phosphatase: 72 U/L (ref 38–126)
Anion gap: 9 (ref 5–15)
BUN: 12 mg/dL (ref 6–20)
CO2: 25 mmol/L (ref 22–32)
Calcium: 9 mg/dL (ref 8.9–10.3)
Chloride: 101 mmol/L (ref 101–111)
Creatinine, Ser: 0.94 mg/dL (ref 0.61–1.24)
GFR calc Af Amer: >60 mL/min (ref >60)
GFR calc non Af Amer: >60 mL/min (ref >60)
Glucose, Bld: 95 mg/dL (ref 65–99)
Potassium: 3.9 mmol/L (ref 3.5–5.1)
Sodium: 135 mmol/L (ref 135–145)
Total Bilirubin: 0.5 mg/dL (ref 0.3–1.2)
Total Protein: 7 g/dL (ref 6.5–8.1)

## 2016-05-22 LAB — CBC
HCT: 44.8 % (ref 39.0–52.0)
Hemoglobin: 15.8 g/dL (ref 13.0–17.0)
MCH: 30.4 pg (ref 26.0–34.0)
MCHC: 35.3 g/dL (ref 30.0–36.0)
MCV: 86.2 fL (ref 78.0–100.0)
Platelets: 240 10*3/uL (ref 150–400)
RBC: 5.2 MIL/uL (ref 4.22–5.81)
RDW: 12.5 % (ref 11.5–15.5)
WBC: 11 10*3/uL — ABNORMAL HIGH (ref 4.0–10.5)

## 2016-05-22 LAB — URINALYSIS, ROUTINE W REFLEX MICROSCOPIC
Bacteria, UA: NONE SEEN
Bilirubin Urine: NEGATIVE
Glucose, UA: NEGATIVE mg/dL
Ketones, ur: NEGATIVE mg/dL
Leukocytes, UA: NEGATIVE
Nitrite: NEGATIVE
Protein, ur: NEGATIVE mg/dL
Specific Gravity, Urine: 1.014 (ref 1.005–1.030)
pH: 6 (ref 5.0–8.0)

## 2016-05-22 MED ORDER — OXYCODONE-ACETAMINOPHEN 5-325 MG PO TABS: 1.0000 | ORAL_TABLET | Freq: Four times a day (QID) | ORAL | 0 refills | Status: DC | PRN

## 2016-05-22 MED ORDER — ONDANSETRON 4 MG PO TBDP
4.0000 mg | ORAL_TABLET | Freq: Once | ORAL | Status: AC
  Administered 2016-05-22: 4 mg via ORAL
  Filled 2016-05-22: qty 1

## 2016-05-22 MED ORDER — ONDANSETRON 4 MG PO TBDP: ORAL_TABLET | ORAL | 0 refills | Status: DC

## 2016-05-22 MED ORDER — OXYCODONE-ACETAMINOPHEN 5-325 MG PO TABS
1.0000 | ORAL_TABLET | Freq: Once | ORAL | Status: AC
  Administered 2016-05-22: 1 via ORAL
  Filled 2016-05-22: qty 1

## 2016-05-22 NOTE — Discharge Instructions (Signed)
Take motrin for pain.   Take percocet for severe pain. DO NOT drive with it.   Take zofran for nausea.   Call surgery office for follow up   Return to ER if you have fever, severe pain, vomiting

## 2016-05-22 NOTE — ED Notes (Signed)
Patient transported to Ultrasound 

## 2016-05-22 NOTE — ED Notes (Addendum)
Two purple, one light green, one blue tube sent to lab.  Dark green held in mini lab

## 2016-05-22 NOTE — ED Provider Notes (Signed)
[facility]-EMERGENCY DEPT Provider Note   CSN: [id number] Arrival date & time: 05/22/16  1239     History   Chief Complaint Chief Complaint  Patient presents with  . Abdominal Pain    HPI GURVEER COLUCCI is a 21 y.o. male hx of ADHD, HTN, asthma here with Right upper quadrant pain. He had sudden onset of right upper quadrant Pain around 11:30 AM that lasted several seconds. He then had another episode of it later this afternoon and felt nauseated with it. Denies any vomiting and denies any fevers. Denies any urinary symptoms or radiation to the pain. Patient had previous umbilical hernia repair but this feels different. Denies increased bulging of his abdomen also hernia.    The history is provided by the patient.    Past Medical History:  Diagnosis Date  . ADHD (attention deficit hyperactivity disorder)   . Asthma   . Hypertension   . Left shoulder pain   . Meningitis     There are no active problems to display for this patient.   Past Surgical History:  Procedure Laterality Date  . APPENDECTOMY    . HERNIA REPAIR         Home Medications    Prior to Admission medications   Medication Sig Start Date End Date Taking? Authorizing Provider  albuterol (PROVENTIL HFA;VENTOLIN HFA) 108 (90 Base) MCG/ACT inhaler Inhale 2 puffs into the lungs every 4 (four) hours as needed for wheezing or shortness of breath. 09/07/15  Yes Samuel Jester, DO  ibuprofen (ADVIL,MOTRIN) 600 MG tablet Take 1 tablet (600 mg total) by mouth every 6 (six) hours as needed. 10/01/13  Yes Shon Baton, MD  metoprolol (LOPRESSOR) 50 MG tablet Take 50 mg by mouth daily.   Yes Historical Provider, MD  benzonatate (TESSALON) 100 MG capsule Take 1 capsule (100 mg total) by mouth 3 (three) times daily as needed for cough. Patient not taking: Reported on 05/22/2016 09/07/15   Samuel Jester, DO  cyclobenzaprine (FLEXERIL) 10 MG tablet Take 1 tablet (10 mg total) by mouth 3 (three) times daily as  needed. Patient not taking: Reported on 05/22/2016 12/27/15   Pauline Aus, PA-C  HYDROcodone-acetaminophen (NORCO/VICODIN) 5-325 MG tablet Take one tab po q 4-6 hrs prn pain Patient not taking: Reported on 05/22/2016 12/27/15   Tammy Triplett, PA-C  naproxen (NAPROSYN) 500 MG tablet Take 1 tablet (500 mg total) by mouth 2 (two) times daily. Patient not taking: Reported on 05/22/2016 08/12/15   Dione Booze, MD  predniSONE (DELTASONE) 20 MG tablet Take 2 tablets (40 mg total) by mouth daily. Patient not taking: Reported on 05/22/2016 09/07/15   Samuel Jester, DO  traMADol (ULTRAM) 50 MG tablet Take 1 tablet (50 mg total) by mouth every 6 (six) hours as needed. Patient not taking: Reported on 05/22/2016 08/12/15   Dione Booze, MD    Family History No family history on file.  Social History Social History  Substance Use Topics  . Smoking status: Never Smoker  . Smokeless tobacco: Never Used  . Alcohol use Yes     Comment: occ.     Allergies   Sulfa antibiotics   Review of Systems Review of Systems  Gastrointestinal: Positive for abdominal pain.  All other systems reviewed and are negative.    Physical Exam Updated Vital Signs BP 138/76   Pulse 80   Temp 97.9 F (36.6 C) (Oral)   Resp 16   SpO2 99%   Physical Exam  Constitutional: He is oriented  to person, place, and time. He appears well-developed.  HENT:  Head: Normocephalic.  Mouth/Throat: Oropharynx is clear and moist.  Eyes: EOM are normal. Pupils are equal, round, and reactive to light.  Neck: Normal range of motion. Neck supple.  Cardiovascular: Normal rate, regular rhythm and normal heart sounds.   Pulmonary/Chest: Effort normal and breath sounds normal. No respiratory distress. He has no wheezes. He has no rales.  Abdominal: Soft. Bowel sounds are normal.  Mild RUQ tenderness, mild R CVAT. Umbilical hernia soft and easily reducible and not tender   Musculoskeletal: Normal range of motion.  Neurological: He is  alert and oriented to person, place, and time.  Skin: Skin is warm.  Psychiatric: He has a normal mood and affect.  Nursing note and vitals reviewed.    ED Treatments / Results  Labs (all labs ordered are listed, but only abnormal results are displayed) Labs Reviewed  COMPREHENSIVE METABOLIC PANEL - Abnormal; Notable for the following:       Result Value   AST 58 (*)    All other components within normal limits  CBC - Abnormal; Notable for the following:    WBC 11.0 (*)    All other components within normal limits  URINALYSIS, ROUTINE W REFLEX MICROSCOPIC - Abnormal; Notable for the following:    Hgb urine dipstick SMALL (*)    Squamous Epithelial / LPF 0-5 (*)    All other components within normal limits  LIPASE, BLOOD    EKG  EKG Interpretation None       Radiology US Abdomen Complete  Result Date: 05/22/2016 CLINICAL DATA:  Intermittent right upper quadrant abdominal pain and nausea since this morning. Patient reportedly had a meal at 5 p.m. EXAM: ABDOMEN ULTRASOUND COMPLETE COMPARISON:  04/01/2014 CT abdomen/ pelvis. FINDINGS: Gallbladder: Contracted gallbladder, which limits gallbladder evaluation. A few scattered gallbladder polyps are demonstrated, largest 5 mm. No shadowing gallstones. No definite gallbladder wall thickening. No pericholecystic fluid or sonographic Murphy's sign. Common bile duct: Diameter: 2 mm Liver: No focal lesion identified. Within normal limits in parenchymal echogenicity. IVC: Obscured by overlying bowel gas. Pancreas: Obscured by overlying bowel gas. Spleen: Size and appearance within normal limits. Right Kidney: Length: 10.5 cm. Echogenicity within normal limits. No mass or hydronephrosis visualized. Left Kidney: Length: 13.3 cm. Echogenicity within normal limits. No mass or hydronephrosis visualized. Abdominal aorta: No aneurysm visualized. Other findings: None. IMPRESSION: 1. Contracted gallbladder, limiting gallbladder evaluation. No evidence of  cholelithiasis or acute cholecystitis. Scattered gallbladder polyps, largest 5 mm, compatible with benign cholesterol polyps, for which no further follow-up is required. This recommendation follows ACR consensus guidelines: White Paper of the ACR Incidental Findings Committee II on Gallbladder and Biliary Findings. J Am Coll Radiol 2013:;10:953-956. 2. Otherwise normal abdominal sonogram, with limitations as described. Electronically Signed   By: Delbert Phenix M.D.   On: 05/22/2016 19:57    Procedures Procedures (including critical care time)  Medications Ordered in ED Medications  oxyCODONE-acetaminophen (PERCOCET/ROXICET) 5-325 MG per tablet 1 tablet (1 tablet Oral Given 05/22/16 2003)  ondansetron (ZOFRAN-ODT) disintegrating tablet 4 mg (4 mg Oral Given 05/22/16 2003)     Initial Impression / Assessment and Plan / ED Course  I have reviewed the triage vital signs and the nursing notes.  Pertinent labs & imaging results that were available during my care of the patient were reviewed by me and considered in my medical decision making (see chart for details).     DIAR BERKEL is a 21  y.o. male who presented with right upper quadrant pain, R flank pain. Likely biliary colic vs renal colic. Will get labs, US renal and RUQ [place].   8:31 PM Patient's LFTs nl. Pain controlled. US showed contracted gallbladder, ? Stone vs polyp. I think likely biliary colic. Will dc home with percocet, zofran, surgery follow up.   Final Clinical Impressions(s) / ED Diagnoses   Final diagnoses:  RUQ pain    New Prescriptions New Prescriptions   No medications on file     Charlynne Pander, MD 05/22/16 2032

## 2016-05-22 NOTE — ED Triage Notes (Signed)
Pt states he was sitting just 30 minutes PTA and had a sudden onset of RUQ pain that lasted for about 15 minutes and went away. Pt states the pain has happened before in the past but just comes and goes.

## 2016-07-20 ENCOUNTER — Encounter: Payer: Self-pay | Admitting: Pediatrics

## 2016-07-20 ENCOUNTER — Ambulatory Visit (INDEPENDENT_AMBULATORY_CARE_PROVIDER_SITE_OTHER): Payer: No Typology Code available for payment source | Admitting: Pediatrics

## 2016-07-20 VITALS — BP 130/89 | HR 94 | Temp 97.7°F | Ht 72.0 in | Wt 246.0 lb

## 2016-07-20 DIAGNOSIS — G43809 Other migraine, not intractable, without status migrainosus: Secondary | ICD-10-CM | POA: Diagnosis not present

## 2016-07-20 DIAGNOSIS — I10 Essential (primary) hypertension: Secondary | ICD-10-CM | POA: Diagnosis not present

## 2016-07-20 DIAGNOSIS — R454 Irritability and anger: Secondary | ICD-10-CM | POA: Diagnosis not present

## 2016-07-20 DIAGNOSIS — J4520 Mild intermittent asthma, uncomplicated: Secondary | ICD-10-CM

## 2016-07-20 MED ORDER — RIZATRIPTAN BENZOATE 10 MG PO TABS: 10.0000 mg | ORAL_TABLET | ORAL | 0 refills | Status: DC | PRN

## 2016-07-20 MED ORDER — IBUPROFEN 600 MG PO TABS
600.0000 mg | ORAL_TABLET | Freq: Four times a day (QID) | ORAL | 1 refills | Status: DC | PRN
Start: 2016-07-20 — End: 2016-07-20

## 2016-07-20 MED ORDER — IBUPROFEN 600 MG PO TABS: 600.0000 mg | ORAL_TABLET | Freq: Four times a day (QID) | ORAL | 1 refills | Status: DC | PRN

## 2016-07-20 MED ORDER — ALBUTEROL SULFATE HFA 108 (90 BASE) MCG/ACT IN AERS: 2.0000 | INHALATION_SPRAY | Freq: Four times a day (QID) | RESPIRATORY_TRACT | 2 refills | Status: DC | PRN

## 2016-07-20 MED ORDER — SPACER/AERO CHAMBER MOUTHPIECE MISC: 1.0000 | Freq: Four times a day (QID) | 0 refills | Status: DC | PRN

## 2016-07-20 MED ORDER — METOPROLOL TARTRATE 50 MG PO TABS: 50.0000 mg | ORAL_TABLET | Freq: Two times a day (BID) | ORAL | 2 refills | Status: DC

## 2016-07-20 MED ORDER — FLUOXETINE HCL 20 MG PO TABS: 20.0000 mg | ORAL_TABLET | Freq: Every day | ORAL | 3 refills | Status: DC

## 2016-07-20 NOTE — Patient Instructions (Signed)
[facility]  [email or web address]

## 2016-07-20 NOTE — Addendum Note (Signed)
Addended by: Caryl BisBOWMAN, Luverna Degenhart M on: 07/20/2016 10:23 AM   Modules accepted: Orders
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
## 2016-08-27 ENCOUNTER — Ambulatory Visit: Payer: No Typology Code available for payment source | Admitting: Pediatrics

## 2016-08-28 ENCOUNTER — Encounter: Payer: Self-pay | Admitting: Pediatrics

## 2018-04-11 DIAGNOSIS — K529 Noninfective gastroenteritis and colitis, unspecified: Secondary | ICD-10-CM

## 2018-04-11 NOTE — ED Provider Notes (Addendum)
[facility]  Emergency Department Treatment Report    Patient: Joshua Macdonald Age: 23 y.o. Sex: male    Date of Birth: 08-10-1995 Admit Date: 04/11/2018 PCP: Alma Downs, Not On File   MRN: [id number]  CSN: [id number]  Attending: Terald Sleeper, MD   Room: ER15/ER15 Time Dictated: 10:51 PM APP:  Virgina Jock, PA-C     Chief Complaint   Vomiting; abdominal pain  History of Present Illness   22 y.o. male presents complaining of nausea, vomiting and diarrhea since 1700 yesterday afternoon. He reports too many to count episodes of each. Denies hematemesis, melena or rectal bleeding.  Denies recent antibiotic use or travel outside the country.  He complains of constant aching upper abdominal pain since yesterday evening.  Denies exacerbating or alleviating factors.  He took over-the-counter Pepto-Bismol with minimal improvement of his symptoms.  Denies fever or chills.  His brother has been ill with similar symptoms.    Review of Systems     Constitutional: No fever, chills  Eyes: No visual symptoms.  ENT: No sore throat, runny nose  Respiratory: No cough, dyspnea or wheezing.  Cardiovascular: No chest pain, palpitations,   Gastrointestinal: Positive nausea, vomiting, diarrhea and upper abdominal pain   genitourinary: No dysuria or  frequency  Musculoskeletal: No lower extremity swelling  Integumentary: No rashes.  Neurological: No headaches  Denies complaints in all other systems.    Past Medical/Surgical History     Past Medical History:   Diagnosis Date   ??? Hypertension      History reviewed. No pertinent surgical history.    Social History     Social History     Socioeconomic History   ??? Marital status: SINGLE     Spouse name: Not on file   ??? Number of children: Not on file   ??? Years of education: Not on file   ??? Highest education level: Not on file   Tobacco Use   ??? Smoking status: Never Smoker   ??? Smokeless tobacco: Never Used   Substance and Sexual Activity   ??? Alcohol use: Never      Frequency: Never   ??? Drug use: Never       Family History   History reviewed. No pertinent family history.    Home Medications     None       Allergies     Allergies   Allergen Reactions   ??? Sulfa (Sulfonamide Antibiotics) Hives       Physical Exam     ED Triage Vitals   ED Encounter Vitals Group      BP 04/11/18 2150 (!) 140/99      Pulse (Heart Rate) 04/11/18 2150 (!) 117      Resp Rate 04/11/18 2150 16      Temp 04/11/18 2150 98.6 ??F (37 ??C)      Temp src --       O2 Sat (%) 04/11/18 2150 97 %      Weight 04/11/18 2147 258 lb      Height 04/11/18 2147 6'       Constitutional: Patient appears well developed and well nourished. Appearance and behavior are age and situation appropriate.  HEENT: Conjunctiva clear. PERRLA. Mucous membranes moist, non-erythematous. Surface of the pharynx, palate, and tongue are pink, moist and without lesions.  Neck: supple, non tender, symmetrical, no masses or JVD. No lymphadenopathy.  Respiratory: lungs clear to auscultation, nonlabored respirations. No tachypnea or accessory muscle use.  Cardiovascular:  Heart rate tachycardic without murmur rubs or gallops.    Gastrointestinal:  Abdomen soft, nondistended, tenderness to palpation in the epigastric region and left upper quadrant, no rebound or guarding, rest of the abdomen is nontender to palpation, no CVA tenderness noted bilaterally  Musculoskeletal: Calves soft and non-tender. Distal pulses 2+ and equal bilaterally.  No peripheral edema   Integumentary: warm and dry without rashes or lesions  Neurologic: alert and oriented. No facial asymmetry or dysarthria. Moving all extremities well    Impression and Management Plan   23 y.o. male presents complaining of nausea, vomiting, diarrhea and upper abdominal pain since yesterday evening.  Patient is tachycardic and hypertensive, rest of his vitals are stable.  He is afebrile and overall well-appearing, nontoxic.  He has epigastric and LUQ abdominal tenderness,  the rest of his exam is unremarkable.  We will obtain appropriate labs, start IV fluid hydration and medicate symptomatically.    Diagnostic Studies   Lab:   Recent Results (from the past 12 hour(s))   CBC WITH AUTOMATED DIFF    Collection Time: 04/11/18 10:45 PM   Result Value Ref Range    WBC 17.7 (H) 4.0 - 11.0 1000/mm3    RBC 5.98 (H) 3.80 - 5.70 M/uL    HGB 17.9 (H) 12.4 - 17.2 gm/dl    HCT 49.7 37.0 - 50.0 %    MCV 83.1 80.0 - 98.0 fL    MCH 29.9 23.0 - 34.6 pg    MCHC 36.0 30.0 - 36.0 gm/dl    PLATELET 240 140 - 450 1000/mm3    MPV 12.3 (H) 6.0 - 10.0 fL    RDW-SD 37.2 35.1 - 43.9      NRBC 0 0 - 0      IMMATURE GRANULOCYTES 0.5 0.0 - 3.0 %    NEUTROPHILS 87.8 (H) 34 - 64 %    LYMPHOCYTES 6.5 (L) 28 - 48 %    MONOCYTES 4.9 1 - 13 %    EOSINOPHILS 0.1 0 - 5 %    BASOPHILS 0.2 0 - 3 %   LIPASE    Collection Time: 04/11/18 10:45 PM   Result Value Ref Range    Lipase 94 73 - 947 U/L   METABOLIC PANEL, COMPREHENSIVE    Collection Time: 04/11/18 10:45 PM   Result Value Ref Range    Sodium 132 (L) 136 - 145 mEq/L    Potassium 3.2 (L) 3.5 - 5.1 mEq/L    Chloride 95 (L) 98 - 107 mEq/L    CO2 30 21 - 32 mEq/L    Glucose 145 (H) 74 - 106 mg/dl    BUN 20 7 - 25 mg/dl    Creatinine 1.0 0.6 - 1.3 mg/dl    GFR est AA >60      GFR est non-AA >60      Calcium 9.4 8.5 - 10.1 mg/dl    AST (SGOT) 75 (H) 15 - 37 U/L    ALT (SGPT) 56 12 - 78 U/L    Alk. phosphatase 86 45 - 117 U/L    Bilirubin, total 0.7 0.2 - 1.0 mg/dl    Protein, total 8.6 (H) 6.4 - 8.2 gm/dl    Albumin 4.0 3.4 - 5.0 gm/dl    Anion gap 8 5 - 15 mmol/L   POC URINE MACROSCOPIC    Collection Time: 04/12/18  1:18 AM   Result Value Ref Range    Glucose Negative NEGATIVE,Negative mg/dl    Bilirubin Small (A)  NEGATIVE,Negative      Ketone 40 (A) NEGATIVE,Negative mg/dl    Specific gravity 1.025 1.005 - 1.030      Blood Negative NEGATIVE,Negative      pH (UA) 7.0 5 - 9      Protein 100 (A) NEGATIVE,Negative mg/dl    Urobilinogen 1.0 0.0 - 1.0 EU/dl     Nitrites Negative NEGATIVE,Negative      Leukocyte Esterase Negative NEGATIVE,Negative      Color Other      Appearance Clear         Imaging:    CT abdomen/pelvis:   Bibasal atelectasis. Heart size is normal.   Liver, spleen, adrenals, pancreas are unremarkable. Post cholecystectomy. No hydronephrosis or urinary stone.   The appendix is not seen. No right lower quadrant inflammatory change.   There are dilated small bowel loops measuring up to 3.4 cm with air-fluid levels. There is collapse of distal bowel in the pelvis.   No free air. No aortic dissection or aneurysm.   No acute bony abnormality.    Impression  Dilated small bowel loops with air-fluid levels and transition point to collapsed distal bowel in the pelvis indicating obstruction. Proximal small bowel is also collapsed raising suspicion for a closed-loop obstruction.     Post cholecystectomy.   Electronically signed on Apr 12, 2018 2:42:03 AM EST by:   Alfonso Ellis, MD   Diplomate, American Board of Radiology    ED Course/Medical Decision Making   Patient remained stable throughout his stay in the ED.  I have reviewed the results with the patient.  Patient with leukocytosis and complaint of persistent upper abdominal pain and on reexam he had tenderness in the epigastric and left upper abdomen.  CT of the abdomen and pelvis obtained which showed dilated small bowel loops with air-fluid levels and transition point to collapsed distal bowel in the pelvis indicating obstruction proximal small bowel is also collapsed raising suspicion for closed-loop obstruction.  I have discussed these findings with general surgery, Dr. Eulas Post who felt that given his persistent diarrhea that clinically this was not a small bowel obstruction.  Patient persistently nauseated and therefore Dr. Eulas Post advised to place an NG tube and treat symptomatically.  We will admit to hospitalist and general surgery will  consult in the morning.  I spoke with [facility] hospitalist, Dr. Lujean Rave who kindly accepts this patient for admission.  Medications   sodium chloride 0.9 % bolus infusion 1,000 mL (0 mL IntraVENous IV Completed 04/11/18 2323)   ondansetron (ZOFRAN) injection 4 mg (4 mg IntraVENous Given 04/11/18 2307)   famotidine (PF) (PEPCID) injection 20 mg (20 mg IntraVENous Given 04/11/18 2307)   promethazine (PHENERGAN) 25 mg in NS IVPB (0 mg IntraVENous IV Completed 04/12/18 0052)   iopamidoL (ISOVUE 300) 61 % contrast injection 80 mL (80 mL IntraVENous Given 04/12/18 0208)   morphine injection 4 mg (4 mg IntraVENous Given 04/12/18 0340)     Final Diagnosis        ICD-10-CM ICD-9-CM   1. Abdominal pain, LUQ (left upper quadrant) R10.12 789.02   2. Diarrhea, unspecified type R19.7 787.91   3. Nausea and vomiting, intractability of vomiting not specified, unspecified vomiting type R11.2 787.01       Disposition   Admitted       There are no discharge medications for this patient.    The patient was fully discussed with Terald Sleeper, MD who agrees with the above assessment and plan  Ronette Deter PA-C  April 12, 2018    My signature above authenticates this document and my orders, the final ??  diagnosis (es), discharge prescription (s), and instructions in the Epic ??  record.  If you have any questions please contact [phone].  ??  Nursing notes have been reviewed by the physician/ advanced practice ??  Clinician.

## 2018-04-11 NOTE — ED Triage Notes (Signed)
Patient came in with abdominal pain, diarrhea and vomiting that has lasted the whole day and the vomiting is continuous  and has a loose motion every time he vomits.has been taking Zofran at home but its not helping, denies any fever.

## 2018-04-11 NOTE — ED Notes (Signed)
Patient c/c of diarrhea, nausea and vomiting since and LUQ pain that started yesterday.  His brother was sick for one day on Friday.  The pain in his abdomen is dull constant pressure. Chest discomfort after vomiting.

## 2018-04-11 NOTE — ED Notes (Signed)
Patient came in with abdominal pain, diarrhea and vomiting that has lasted the whole day and the vomiting is continuous  and has a loose motion every time he vomits.has been taking Zofran at home but its not helping, denies any fever.

## 2018-04-11 NOTE — ED Provider Notes (Signed)
ED Provider Notes by Virgina Jock, PA-C at 04/11/18 2251                Author: Virgina Jock, PA-C  Service: EMERGENCY  Author Type: Physician Assistant       Filed: 04/12/18 0353  Date of Service: 04/11/18 2251  Status: Attested Addendum          Editor: Levy Sjogren (Physician Assistant)       Related Notes: Original Note by Levy Sjogren (Physician Assistant) filed at 04/12/18  [phone]          Cosigner: Terald Sleeper, MD at 04/12/18 0630          Attestation signed by Terald Sleeper, MD at 04/12/18 0630          I, Dr. Terald Sleeper, have personally seen and examined this patient; I have fully participate in the care of this patient with the advanced practice  provider.  I have reviewed and agree with all pertinent clinical information including history, physical exam, labs, radiographic studies and plan.  I have also reviewed and agree with the medications, allergies and past medical history sections for this  patient.                                    [facility]   Emergency Department Treatment Report          Patient: Joshua Macdonald  Age: 23 y.o.  Sex: male          Date of Birth: 1996/01/11  Admit Date: 04/11/2018  PCP: Alma Downs, Not On File     MRN: [id number]   CSN: [id number]   Attending: Terald Sleeper, MD         Room: ER15/ER15  Time Dictated: 10:51 PM  APP:  Virgina Jock, PA-C        Chief Complaint    Vomiting; abdominal pain     History of Present Illness     23 y.o. male  presents complaining of nausea, vomiting and diarrhea since 1700 yesterday afternoon. He reports too many to count episodes of each. Denies hematemesis, melena or rectal bleeding.  Denies recent antibiotic use or travel outside the country.  He complains  of constant aching upper abdominal pain since yesterday evening.  Denies exacerbating or alleviating factors.  He took over-the-counter Pepto-Bismol with minimal improvement of his  symptoms.  Denies fever or chills.  His brother has been ill with similar  symptoms.        Review of Systems        Constitutional: No fever, chills   Eyes: No visual symptoms.   ENT: No sore throat, runny nose   Respiratory: No cough, dyspnea or wheezing.   Cardiovascular: No chest pain, palpitations,    Gastrointestinal: Positive nausea, vomiting, diarrhea and upper abdominal pain    genitourinary: No dysuria or  frequency   Musculoskeletal: No lower extremity swelling   Integumentary: No rashes.   Neurological: No headaches   Denies complaints in all other systems.        Past Medical/Surgical History          Past Medical History:        Diagnosis  Date         ?  Hypertension          History reviewed. No pertinent surgical history.  Social History          Social History          Socioeconomic History         ?  Marital status:  SINGLE              Spouse name:  Not on file         ?  Number of children:  Not on file     ?  Years of education:  Not on file     ?  Highest education level:  Not on file       Tobacco Use         ?  Smoking status:  Never Smoker     ?  Smokeless tobacco:  Never Used       Substance and Sexual Activity         ?  Alcohol use:  Never              Frequency:  Never         ?  Drug use:  Never             Family History     History reviewed. No pertinent family history.        Home Medications          None             Allergies          Allergies        Allergen  Reactions         ?  Sulfa (Sulfonamide Antibiotics)  Hives             Physical Exam          ED Triage Vitals     ED Encounter Vitals Group            BP  04/11/18 2150  (!) 140/99        Pulse (Heart Rate)  04/11/18 2150  (!) 117        Resp Rate  04/11/18 2150  16        Temp  04/11/18 2150  98.6 ??F (37 ??C)        Temp src  --          O2 Sat (%)  04/11/18 2150  97 %        Weight  04/11/18 2147  258 lb            Height  04/11/18 2147  6'           Constitutional: Patient appears well developed and well  nourished. Appearance and behavior are age and situation appropriate.   HEENT: Conjunctiva clear. PERRLA. Mucous membranes moist, non-erythematous. Surface of the pharynx, palate, and tongue are pink, moist and  without lesions.   Neck: supple, non tender, symmetrical, no masses or JVD. No lymphadenopathy.   Respiratory: lungs clear to auscultation, nonlabored respirations. No tachypnea or accessory muscle use.   Cardiovascular: Heart rate tachycardic without murmur rubs or gallops.     Gastrointestinal:  Abdomen soft, nondistended, tenderness to palpation in the epigastric region and left upper quadrant, no rebound or guarding,  rest of the abdomen is nontender to palpation, no CVA tenderness noted bilaterally   Musculoskeletal: Calves soft and non-tender. Distal pulses 2+ and equal bilaterally.  No peripheral edema    Integumentary: warm and dry without rashes or lesions   Neurologic: alert and oriented. No  facial asymmetry or dysarthria. Moving all extremities well        Impression and Management Plan     23 y.o. male  presents complaining of nausea, vomiting, diarrhea and upper abdominal pain since yesterday evening.  Patient is tachycardic and hypertensive, rest of his vitals are stable.  He is afebrile and overall well-appearing, nontoxic.  He has epigastric and  LUQ abdominal tenderness, the rest of his exam is unremarkable.  We will obtain appropriate labs, start IV fluid hydration and medicate symptomatically.        Diagnostic Studies     Lab:      Recent Results (from the past 12 hour(s))     CBC WITH AUTOMATED DIFF          Collection Time: 04/11/18 10:45 PM         Result  Value  Ref Range            WBC  17.7 (H)  4.0 - 11.0 1000/mm3       RBC  5.98 (H)  3.80 - 5.70 M/uL       HGB  17.9 (H)  12.4 - 17.2 gm/dl       HCT  49.7  37.0 - 50.0 %       MCV  83.1  80.0 - 98.0 fL       MCH  29.9  23.0 - 34.6 pg       MCHC  36.0  30.0 - 36.0 gm/dl       PLATELET  240  140 - 450 1000/mm3       MPV  12.3 (H)  6.0  - 10.0 fL       RDW-SD  37.2  35.1 - 43.9         NRBC  0  0 - 0         IMMATURE GRANULOCYTES  0.5  0.0 - 3.0 %       NEUTROPHILS  87.8 (H)  34 - 64 %       LYMPHOCYTES  6.5 (L)  28 - 48 %       MONOCYTES  4.9  1 - 13 %       EOSINOPHILS  0.1  0 - 5 %       BASOPHILS  0.2  0 - 3 %       LIPASE          Collection Time: 04/11/18 10:45 PM         Result  Value  Ref Range            Lipase  94  73 - 393 U/L       METABOLIC PANEL, COMPREHENSIVE          Collection Time: 04/11/18 10:45 PM         Result  Value  Ref Range            Sodium  132 (L)  136 - 145 mEq/L       Potassium  3.2 (L)  3.5 - 5.1 mEq/L       Chloride  95 (L)  98 - 107 mEq/L            CO2  30  21 - 32 mEq/L            Glucose  145 (H)  74 - 106 mg/dl       BUN  20  7 - 25 mg/dl  Creatinine  1.0  0.6 - 1.3 mg/dl       GFR est AA  >60          GFR est non-AA  >60          Calcium  9.4  8.5 - 10.1 mg/dl       AST (SGOT)  75 (H)  15 - 37 U/L       ALT (SGPT)  56  12 - 78 U/L       Alk. phosphatase  86  45 - 117 U/L       Bilirubin, total  0.7  0.2 - 1.0 mg/dl       Protein, total  8.6 (H)  6.4 - 8.2 gm/dl       Albumin  4.0  3.4 - 5.0 gm/dl       Anion gap  8  5 - 15 mmol/L       POC URINE MACROSCOPIC          Collection Time: 04/12/18  1:18 AM         Result  Value  Ref Range            Glucose  Negative  NEGATIVE,Negative mg/dl       Bilirubin  Small (A)  NEGATIVE,Negative         Ketone  40 (A)  NEGATIVE,Negative mg/dl       Specific gravity  1.025  1.005 - 1.030         Blood  Negative  NEGATIVE,Negative         pH (UA)  7.0  5 - 9         Protein  100 (A)  NEGATIVE,Negative mg/dl       Urobilinogen  1.0  0.0 - 1.0 EU/dl       Nitrites  Negative  NEGATIVE,Negative         Leukocyte Esterase  Negative  NEGATIVE,Negative         Color  Other               Appearance  Clear              Imaging:     CT abdomen/pelvis:   Bibasal atelectasis. Heart size is normal.   Liver, spleen, adrenals, pancreas are unremarkable. Post cholecystectomy. No  hydronephrosis or urinary stone.   The appendix is not seen. No right lower quadrant inflammatory  change.   There are dilated small bowel loops measuring up to 3.4 cm with air-fluid levels. There is collapse of distal bowel in the pelvis.   No free air. No aortic dissection or aneurysm.   No acute bony abnormality.    Impression   Dilated small bowel loops with air-fluid levels and transition point to collapsed distal bowel in the pelvis indicating obstruction. Proximal small bowel is also collapsed raising suspicion for a closed-loop obstruction.     Post cholecystectomy.    Electronically signed on Apr 12, 2018 2:42:03 AM EST by:    Alfonso Ellis, MD    Diplomate, American Board of Radiology        ED Course/Medical Decision Making     Patient remained stable throughout his stay in the ED.  I have reviewed the results with the patient.  Patient with leukocytosis and complaint of persistent upper  abdominal pain and on reexam he had tenderness in the epigastric and left upper abdomen.  CT of the abdomen and pelvis obtained which showed  dilated small bowel loops with air-fluid levels and transition point to collapsed distal bowel in the pelvis indicating  obstruction proximal small bowel is also collapsed raising suspicion for closed-loop obstruction.  I have discussed these findings with general surgery, Dr. Eulas Post who felt that given his persistent diarrhea that clinically this was not a small bowel  obstruction.  Patient persistently nauseated and therefore Dr. Eulas Post advised to place an NG tube and treat symptomatically.  We will admit to hospitalist and general surgery will consult in the morning.  I spoke with [facility] hospitalist, Dr. Lujean Rave  who kindly accepts this patient for admission.     Medications       sodium chloride 0.9 % bolus infusion 1,000 mL (0 mL IntraVENous IV Completed 04/11/18 2323)     ondansetron (ZOFRAN) injection 4 mg (4 mg IntraVENous Given 04/11/18 2307)     famotidine (PF) (PEPCID)  injection 20 mg (20 mg IntraVENous Given 04/11/18 2307)     promethazine (PHENERGAN) 25 mg in NS IVPB (0 mg IntraVENous IV Completed 04/12/18 0052)     iopamidoL (ISOVUE 300) 61 % contrast injection 80 mL (80 mL IntraVENous Given 04/12/18 0208)       morphine injection 4 mg (4 mg IntraVENous Given 04/12/18 0340)          Final Diagnosis                  ICD-10-CM  ICD-9-CM          1.  Abdominal pain, LUQ (left upper quadrant)  R10.12  789.02     2.  Diarrhea, unspecified type  R19.7  787.91          3.  Nausea and vomiting, intractability of vomiting not specified, unspecified vomiting type  R11.2  787.01             Disposition     Admitted          There are no discharge medications for this patient.      The patient was fully discussed with Terald Sleeper, MD who agrees with the above assessment and plan         Ronette Deter PA-C   April 12, 2018      My signature above authenticates this document and my orders, the final ??   diagnosis (es), discharge prescription (s), and instructions in the Epic ??   record.   If you have any questions please contact [phone].   ??   Nursing notes have been reviewed by the physician/ advanced practice ??   Clinician.

## 2018-04-11 NOTE — ED Notes (Signed)
Patient c/c of diarrhea, nausea and vomiting since and LUQ pain that started yesterday.  His brother was sick for one day on Friday.  The pain in his abdomen is dull constant pressure. Chest discomfort after vomiting.

## 2018-04-12 ENCOUNTER — Inpatient Hospital Stay
Admit: 2018-04-12 | Discharge: 2018-04-12 | Disposition: A | Discharge status: Home / Self Care | Payer: Self-pay | Attending: Emergency Medicine

## 2018-04-12 ENCOUNTER — Emergency Department: Admit: 2018-04-12 | Payer: Self-pay | Primary: Student in an Organized Health Care Education/Training Program

## 2018-04-12 LAB — COMPREHENSIVE METABOLIC PANEL
ALT: 56 U/L (ref 12–78)
AST: 75 U/L — ABNORMAL HIGH (ref 15–37)
Albumin: 4 gm/dl (ref 3.4–5.0)
Alkaline Phosphatase: 86 U/L (ref 45–117)
Anion Gap: 8 mmol/L (ref 5–15)
BUN: 20 mg/dl (ref 7–25)
CO2: 30 mEq/L (ref 21–32)
Calcium: 9.4 mg/dl (ref 8.5–10.1)
Chloride: 95 mEq/L — ABNORMAL LOW (ref 98–107)
Creatinine: 1 mg/dl (ref 0.6–1.3)
EGFR IF NonAfrican American: >60
GFR African American: >60
Glucose: 145 mg/dl — ABNORMAL HIGH (ref 74–106)
Potassium: 3.2 mEq/L — ABNORMAL LOW (ref 3.5–5.1)
Sodium: 132 mEq/L — ABNORMAL LOW (ref 136–145)
Total Bilirubin: 0.7 mg/dl (ref 0.2–1.0)
Total Protein: 8.6 gm/dl — ABNORMAL HIGH (ref 6.4–8.2)

## 2018-04-12 LAB — POC URINE MACROSCOPIC
Blood, Urine: NEGATIVE
Blood: NEGATIVE
Glucose, Ur: NEGATIVE mg/dl
Glucose: NEGATIVE mg/dl
Ketone: 40 mg/dl — AB
Ketones, Urine: 40 mg/dl — AB
Leukocyte Esterase, Urine: NEGATIVE
Leukocyte Esterase: NEGATIVE
Nitrite, Urine: NEGATIVE
Nitrites: NEGATIVE
Protein, UA: 100 mg/dl — AB
Protein: 100 mg/dl — AB
Specific Gravity, UA: 1.025 (ref 1.005–1.030)
Specific gravity: 1.025 (ref 1.005–1.030)
Urobilinogen, UA, POCT: 1 EU/dl (ref 0.0–1.0)
Urobilinogen: 1 EU/dl (ref 0.0–1.0)
pH (UA): 7 (ref 5–9)
pH, UA: 7 (ref 5–9)

## 2018-04-12 LAB — CBC WITH AUTO DIFFERENTIAL
Basophils %: 0.2 % (ref 0–3)
Eosinophils %: 0.1 % (ref 0–5)
Hematocrit: 49.7 % (ref 37.0–50.0)
Hemoglobin: 17.9 gm/dl — ABNORMAL HIGH (ref 12.4–17.2)
Immature Granulocytes: 0.5 % (ref 0.0–3.0)
Lymphocytes %: 6.5 % — ABNORMAL LOW (ref 28–48)
MCH: 29.9 pg (ref 23.0–34.6)
MCHC: 36 gm/dl (ref 30.0–36.0)
MCV: 83.1 fL (ref 80.0–98.0)
MPV: 12.3 fL — ABNORMAL HIGH (ref 6.0–10.0)
Monocytes %: 4.9 % (ref 1–13)
Neutrophils %: 87.8 % — ABNORMAL HIGH (ref 34–64)
Nucleated RBCs: 0 (ref 0–0)
Platelets: 240 10*3/uL (ref 140–450)
RBC: 5.98 M/uL — ABNORMAL HIGH (ref 3.80–5.70)
RDW-SD: 37.2 (ref 35.1–43.9)
WBC: 17.7 10*3/uL — ABNORMAL HIGH (ref 4.0–11.0)

## 2018-04-12 LAB — LIPASE
Lipase: 94 U/L (ref 73–393)
Lipase: 94 U/L (ref 73–393)

## 2018-04-12 LAB — PHOSPHORUS
Phosphorus: 4.3 mg/dl (ref 2.5–4.9)
Phosphorus: 4.3 mg/dl (ref 2.5–4.9)

## 2018-04-12 LAB — LACTIC ACID
LACTIC ACID: 1.2 mmol/L (ref 0.4–2.0)
Lactic Acid: 1.2 mmol/L (ref 0.4–2.0)

## 2018-04-12 LAB — C. DIFFICILE/EPI PCR
C. DIFF TOXIN BY PCR: NEGATIVE
C. diff toxin by PCR: NEGATIVE

## 2018-04-12 LAB — MAGNESIUM
Magnesium: 2 mg/dl (ref 1.6–2.6)
Magnesium: 2 mg/dl (ref 1.6–2.6)

## 2018-04-12 LAB — METABOLIC PANEL, COMPREHENSIVE
ALT (SGPT): 56 U/L (ref 12–78)
AST (SGOT): 75 U/L — ABNORMAL HIGH (ref 15–37)
Albumin: 4 gm/dl (ref 3.4–5.0)
Alk. phosphatase: 86 U/L (ref 45–117)
Anion gap: 8 mmol/L (ref 5–15)
BUN: 20 mg/dl (ref 7–25)
Bilirubin, total: 0.7 mg/dl (ref 0.2–1.0)
CO2: 30 mEq/L (ref 21–32)
Calcium: 9.4 mg/dl (ref 8.5–10.1)
Chloride: 95 mEq/L — ABNORMAL LOW (ref 98–107)
Creatinine: 1 mg/dl (ref 0.6–1.3)
GFR est AA: >60
GFR est non-AA: >60
Glucose: 145 mg/dl — ABNORMAL HIGH (ref 74–106)
Potassium: 3.2 mEq/L — ABNORMAL LOW (ref 3.5–5.1)
Protein, total: 8.6 gm/dl — ABNORMAL HIGH (ref 6.4–8.2)
Sodium: 132 mEq/L — ABNORMAL LOW (ref 136–145)

## 2018-04-12 LAB — CBC WITH AUTOMATED DIFF
BASOPHILS: 0.2 % (ref 0–3)
EOSINOPHILS: 0.1 % (ref 0–5)
HCT: 49.7 % (ref 37.0–50.0)
HGB: 17.9 gm/dl — ABNORMAL HIGH (ref 12.4–17.2)
IMMATURE GRANULOCYTES: 0.5 % (ref 0.0–3.0)
LYMPHOCYTES: 6.5 % — ABNORMAL LOW (ref 28–48)
MCH: 29.9 pg (ref 23.0–34.6)
MCHC: 36 gm/dl (ref 30.0–36.0)
MCV: 83.1 fL (ref 80.0–98.0)
MONOCYTES: 4.9 % (ref 1–13)
MPV: 12.3 fL — ABNORMAL HIGH (ref 6.0–10.0)
NEUTROPHILS: 87.8 % — ABNORMAL HIGH (ref 34–64)
NRBC: 0 (ref 0–0)
PLATELET: 240 10*3/uL (ref 140–450)
RBC: 5.98 M/uL — ABNORMAL HIGH (ref 3.80–5.70)
RDW-SD: 37.2 (ref 35.1–43.9)
WBC: 17.7 10*3/uL — ABNORMAL HIGH (ref 4.0–11.0)

## 2018-04-12 MED ORDER — HYDROCHLOROTHIAZIDE 25 MG TAB
25 mg | Freq: Every day | ORAL | Status: DC
Start: 2018-04-12 — End: 2018-04-13
  Administered 2018-04-13: 13:00:00 via ORAL

## 2018-04-12 MED ORDER — MORPHINE 2 MG/ML INJECTION
2 mg/mL | INTRAMUSCULAR | Status: DC | PRN
Start: 2018-04-12 — End: 2018-04-13

## 2018-04-12 MED ORDER — MORPHINE 4 MG/ML SYRINGE
4 mg/mL | INTRAMUSCULAR | Status: AC
Start: 2018-04-12 — End: 2018-04-12
  Administered 2018-04-12: 09:00:00 via INTRAVENOUS

## 2018-04-12 MED ORDER — NALOXONE 0.4 MG/ML INJECTION
0.4 mg/mL | INTRAMUSCULAR | Status: DC | PRN
Start: 2018-04-12 — End: 2018-04-13

## 2018-04-12 MED ORDER — SODIUM CHLORIDE 0.9 % IV
INTRAVENOUS | Status: DC
Start: 2018-04-12 — End: 2018-04-12
  Administered 2018-04-12: 11:00:00 via INTRAVENOUS

## 2018-04-12 MED ORDER — PROMETHAZINE IN NS 12.5 MG/50 ML IV PIGGY BAG
12.5 mg/50 ml | Freq: Four times a day (QID) | INTRAVENOUS | Status: DC | PRN
Start: 2018-04-12 — End: 2018-04-13

## 2018-04-12 MED ORDER — SODIUM CHLORIDE 0.9% BOLUS IV
0.9 % | INTRAVENOUS | Status: AC
Start: 2018-04-12 — End: 2018-04-11
  Administered 2018-04-12: 04:00:00 via INTRAVENOUS

## 2018-04-12 MED ORDER — ONDANSETRON (PF) 4 MG/2 ML INJECTION
4 mg/2 mL | Freq: Four times a day (QID) | INTRAMUSCULAR | Status: DC | PRN
Start: 2018-04-12 — End: 2018-04-13

## 2018-04-12 MED ORDER — SODIUM CHLORIDE 0.9 % IV
INTRAVENOUS | Status: DC
Start: 2018-04-12 — End: 2018-04-12
  Administered 2018-04-12: 14:00:00 via INTRAVENOUS

## 2018-04-12 MED ORDER — NS WITH POTASSIUM CHLORIDE 20 MEQ/L IV
20 mEq/L | INTRAVENOUS | Status: DC
Start: 2018-04-12 — End: 2018-04-13
  Administered 2018-04-12 – 2018-04-13 (×3): via INTRAVENOUS

## 2018-04-12 MED ORDER — PROMETHAZINE IN NS 25 MG/50 ML IV PIGGY BAG
25 mg/50 ml | INTRAVENOUS | Status: AC
Start: 2018-04-12 — End: 2018-04-12
  Administered 2018-04-12: 06:00:00 via INTRAVENOUS

## 2018-04-12 MED ORDER — FAMOTIDINE (PF) 20 MG/2 ML IV
20 mg/2 mL | INTRAVENOUS | Status: AC
Start: 2018-04-12 — End: 2018-04-11
  Administered 2018-04-12: 04:00:00 via INTRAVENOUS

## 2018-04-12 MED ORDER — IOPAMIDOL 61 % IV SOLN
61 % | Freq: Once | INTRAVENOUS | Status: AC
Start: 2018-04-12 — End: 2018-04-12
  Administered 2018-04-12: 07:00:00 via INTRAVENOUS

## 2018-04-12 MED ORDER — ACETAMINOPHEN 325 MG TABLET
325 mg | Freq: Four times a day (QID) | ORAL | Status: DC | PRN
Start: 2018-04-12 — End: 2018-04-13

## 2018-04-12 MED ORDER — SODIUM CHLORIDE 0.9 % IJ SYRG
Freq: Three times a day (TID) | INTRAMUSCULAR | Status: DC
Start: 2018-04-12 — End: 2018-04-13
  Administered 2018-04-12 – 2018-04-13 (×4): via INTRAVENOUS

## 2018-04-12 MED ORDER — SODIUM CHLORIDE 0.9 % IJ SYRG
INTRAMUSCULAR | Status: DC | PRN
Start: 2018-04-12 — End: 2018-04-13

## 2018-04-12 MED ORDER — ONDANSETRON (PF) 4 MG/2 ML INJECTION
4 mg/2 mL | Freq: Once | INTRAMUSCULAR | Status: AC
Start: 2018-04-12 — End: 2018-04-11
  Administered 2018-04-12: 04:00:00 via INTRAVENOUS

## 2018-04-12 MED FILL — SODIUM CHLORIDE 0.9 % IV: INTRAVENOUS | Qty: 1000

## 2018-04-12 MED FILL — ISOVUE-300  61 % INTRAVENOUS SOLUTION: 300 mg iodine /mL (61 %) | INTRAVENOUS | Qty: 80

## 2018-04-12 MED FILL — NS WITH POTASSIUM CHLORIDE 20 MEQ/L IV: 20 mEq/L | INTRAVENOUS | Qty: 1000

## 2018-04-12 MED FILL — FAMOTIDINE (PF) 20 MG/2 ML IV: 20 mg/2 mL | INTRAVENOUS | Qty: 2

## 2018-04-12 MED FILL — PROMETHAZINE IN NS 25 MG/50 ML IV PIGGY BAG: 25 mg/50 ml | INTRAVENOUS | Qty: 50

## 2018-04-12 MED FILL — MORPHINE 4 MG/ML SYRINGE: 4 mg/mL | INTRAMUSCULAR | Qty: 1

## 2018-04-12 MED FILL — ONDANSETRON (PF) 4 MG/2 ML INJECTION: 4 mg/2 mL | INTRAMUSCULAR | Qty: 2

## 2018-04-12 NOTE — H&P (Signed)
Medicine History and Physical    Patient: [name] Age: 23 y.o. Sex: male    Date of Birth: 11-21-1995 Admit Date: 04/11/2018 PCP: Alma Downs, Not On File   MRN: [id number]  CSN: [id number]         Assessment   Abdominal pain, LUQ (left upper quadrant) [R10.12]  Diarrhea, unspecified type [R19.7]  Ileus and less likely small bowel obstruction   Suspect Gastroenteritis   Nausea and vomiting, intractability of vomiting not specified, unspecified vomiting type [R11.2]  HTN       Plan   IVF   Pain management   Stool culture, Cdiff   Trend WBC and fever curve   Check Mg, Phos, lactic acid   NGT placed   General Sx consulted in ED   Diet NPO   DVT PPX SCD   ACP: CODE STATUS FULL CODE       Chief Complaint:  Chief Complaint   Patient presents with   ??? Vomiting   ??? Abdominal Pain   ??? Diarrhea         HPI:   Jahsir Macdonald is a 23 y.o. year old male who presents with nausea, vomiting and diarrhea since five PM. Hx of appendectomy and cholecystectomy. Patient has family members with similar symptoms. WBC elevated. In ED, given IVF, Zofran, Pepcid, phenergan. CT abdomen and pelvis shows dilated Small bowel loops and possible obstruction and patient having diarrhea. General Surgery consulted.       Review of Systems - 12 Point ROS -ve except what is noted in the HPI.     Past Medical History:  Past Medical History:   Diagnosis Date   ??? Hypertension        Past Surgical History:  History reviewed. No pertinent surgical history.    Family History:  History reviewed. No pertinent family history.    Social History:  Social History     Socioeconomic History   ??? Marital status: SINGLE     Spouse name: Not on file   ??? Number of children: Not on file   ??? Years of education: Not on file   ??? Highest education level: Not on file   Tobacco Use   ??? Smoking status: Never Smoker   ??? Smokeless tobacco: Never Used   Substance and Sexual Activity   ??? Alcohol use: Never     Frequency: Never   ??? Drug use: Never       Home Medications:   Prior to Admission medications    Not on File       Allergies:  Allergies   Allergen Reactions   ??? Sulfa (Sulfonamide Antibiotics) Hives         Physical Exam:     Visit Vitals  BP 136/90   Pulse 92   Temp 98.6 ??F (37 ??C)   Resp 16   Ht 6' (1.829 m)   Wt 117 kg (258 lb)   SpO2 96%   BMI 34.99 kg/m??       Physical Exam:  General appearance: alert, cooperative, no distress, appears stated age  Head: Normocephalic, without obvious abnormality, atraumatic  Neck: supple, trachea midline  Lungs: clear to auscultation bilaterally  Heart: regular rate and rhythm, S1, S2 normal, no murmur, click, rub or gallop  Abdomen: soft, non-tender. Bowel sounds normal. No masses,  no organomegaly  Extremities: extremities normal, atraumatic, no cyanosis or edema  Skin: Skin color, texture, turgor normal. No rashes or lesions  Neurologic: Grossly normal  Intake and Output:  Current Shift:  03/06 1901 - 03/07 0700  In: 1050 [I.V.:1050]  Out: -   Last three shifts:  No intake/output data recorded.    Lab/Data Reviewed:  Lab:   Recent Results (from the past 12 hour(s))   CBC WITH AUTOMATED DIFF    Collection Time: 04/11/18 10:45 PM   Result Value Ref Range    WBC 17.7 (H) 4.0 - 11.0 1000/mm3    RBC 5.98 (H) 3.80 - 5.70 M/uL    HGB 17.9 (H) 12.4 - 17.2 gm/dl    HCT 49.7 37.0 - 50.0 %    MCV 83.1 80.0 - 98.0 fL    MCH 29.9 23.0 - 34.6 pg    MCHC 36.0 30.0 - 36.0 gm/dl    PLATELET 240 140 - 450 1000/mm3    MPV 12.3 (H) 6.0 - 10.0 fL    RDW-SD 37.2 35.1 - 43.9      NRBC 0 0 - 0      IMMATURE GRANULOCYTES 0.5 0.0 - 3.0 %    NEUTROPHILS 87.8 (H) 34 - 64 %    LYMPHOCYTES 6.5 (L) 28 - 48 %    MONOCYTES 4.9 1 - 13 %    EOSINOPHILS 0.1 0 - 5 %    BASOPHILS 0.2 0 - 3 %   LIPASE    Collection Time: 04/11/18 10:45 PM   Result Value Ref Range    Lipase 94 73 - 284 U/L   METABOLIC PANEL, COMPREHENSIVE    Collection Time: 04/11/18 10:45 PM   Result Value Ref Range    Sodium 132 (L) 136 - 145 mEq/L    Potassium 3.2 (L) 3.5 - 5.1 mEq/L     Chloride 95 (L) 98 - 107 mEq/L    CO2 30 21 - 32 mEq/L    Glucose 145 (H) 74 - 106 mg/dl    BUN 20 7 - 25 mg/dl    Creatinine 1.0 0.6 - 1.3 mg/dl    GFR est AA >60      GFR est non-AA >60      Calcium 9.4 8.5 - 10.1 mg/dl    AST (SGOT) 75 (H) 15 - 37 U/L    ALT (SGPT) 56 12 - 78 U/L    Alk. phosphatase 86 45 - 117 U/L    Bilirubin, total 0.7 0.2 - 1.0 mg/dl    Protein, total 8.6 (H) 6.4 - 8.2 gm/dl    Albumin 4.0 3.4 - 5.0 gm/dl    Anion gap 8 5 - 15 mmol/L   POC URINE MACROSCOPIC    Collection Time: 04/12/18  1:18 AM   Result Value Ref Range    Glucose Negative NEGATIVE,Negative mg/dl    Bilirubin Small (A) NEGATIVE,Negative      Ketone 40 (A) NEGATIVE,Negative mg/dl    Specific gravity 1.025 1.005 - 1.030      Blood Negative NEGATIVE,Negative      pH (UA) 7.0 5 - 9      Protein 100 (A) NEGATIVE,Negative mg/dl    Urobilinogen 1.0 0.0 - 1.0 EU/dl    Nitrites Negative NEGATIVE,Negative      Leukocyte Esterase Negative NEGATIVE,Negative      Color Other      Appearance Clear         Imaging:    No results found.      Rosalene Billings, MD  April 12, 2018

## 2018-04-12 NOTE — Progress Notes (Signed)
RN paged hospitalist to see if the patient is able to change their diet from NPO since surgeon spoke with patient this morning and told them they do not believe it is bowel obstruction.  Awaiting callback.

## 2018-04-12 NOTE — Other (Signed)
Bedside and Verbal shift change report given to Megan, RN (oncoming nurse) by Nan, RN (offgoing nurse). Report included the following information SBAR, Kardex, Intake/Output, MAR and Recent Results.

## 2018-04-12 NOTE — ED Notes (Signed)
Patient handed over to Vicky-RN

## 2018-04-12 NOTE — Progress Notes (Signed)
CM spoke with pt in room. Pt lives with his mother and father. Plan is to return home when duicharged. Pt will drive self home. Pt used no DME's prior to admission. No needs noted at this time. Will continue to follow for any changes in discharge plans.     Case Management Assessment      Preferred Language for Healthcare Related Communication         Spiritual/Ethnic/Cultural/Religious Needs that Should be Incorporated Into Your Care          FUNCTIONAL ASSESSMENT   Fall in Past 12 Months: No               Decline in Gait/Transfer/Balance: No       Decline in Capacity to Feed/Dress/Bathe: No   Developmental Delay: No   Chewing/Swallowing Problems: No      DYSPHAGIA SCREENING                          Difficulty with Secretions     Difficulty with Secretions: No      Speech Slurred/Thick/Garbled     Speech Slurred/Thick/Garbled: No      ABUSE/NEGLECT SCREENING   Physical Abuse/Neglect: Denies           Other Abuse/Issues: Denies          PRIMARY DECISION MAKER                                        ADVANCE CARE PLANNING (ACP) DOCUMENTS   Confirm Advance Directive: None              Suicide/Psychosocial Screening                                      SAD PERSONS                                                  READMIT RISK TOOL   Support Systems: Family member(s)       History of Falls Within Past 3 Months: No   Needs Assistance with Wound Care AND/OR Mgnt of O2, Nebulizer: No   Requires Financial, Physical and/or Educational Assistance With Medications: Yes   History of Mental Illness: No   Living Alone: No      CARE MANAGEMENT INTERVENTIONS       PCP Verified by CM: Yes(James Alstin)           Mode of Transport at Discharge: Self       Transition of Care Consult (CM Consult): Discharge Planning   Comfort Care: No       Reason Outside Comfort Care Chosen: Other (see comment)(No HH ordered)   MyChart Signup: No   Discharge Durable Medical Equipment: No   Physical Therapy Consult: No   Occupational Therapy Consult: No    Speech Therapy Consult: No   Current Support Network: Relative's Home   Reason for Referral: DCP Rounds   History Provided By: Patient   Patient Orientation: Alert and Oriented   Cognition: Alert       Previous Living Arrangement: Lives with Family Independent         Prior Functional Level: Independent in ADLs/IADLs   Current Functional Level: Independent in ADLs/IADLs       Can patient return to prior living arrangement: Yes   Ability to make needs known:: Good   Family able to assist with home care needs:: Yes   Pets: None           Types of Needs Identified: Disease Management Education       Confirm Follow Up Transport: Self   Confirm Transport and Arrange: Yes              DISCHARGE LOCATION   Discharge Placement: Home     3

## 2018-04-12 NOTE — ED Notes (Signed)
TRANSFER - OUT REPORT:    Verbal report given to Nan, RN (name) on Joshua Macdonald  being transferred to [facility] (unit) for routine progression of care       Report consisted of patient???s Situation, Background, Assessment and   Recommendations(SBAR).     Information from the following report(s) SBAR was reviewed with the receiving nurse.    Lines:   Peripheral IV 04/11/18 Right Arm (Active)   Site Assessment Clean, dry, & intact 04/11/2018 11:22 PM   Phlebitis Assessment 0 04/11/2018 11:22 PM   Infiltration Assessment 0 04/11/2018 11:22 PM   Dressing Status Clean, dry, & intact 04/11/2018 11:22 PM   Dressing Type Transparent 04/11/2018 11:22 PM   Hub Color/Line Status Pink 04/11/2018 11:22 PM   Alcohol Cap Used Yes 04/11/2018 11:22 PM        Opportunity for questions and clarification was provided.      Patient transported with:   [employer]

## 2018-04-12 NOTE — Other (Signed)
Bedside and Verbal shift change report given to Caroline G Schnelle   (oncoming nurse) by Megan (offgoing nurse). Report included the following information SBAR, Kardex and MAR.

## 2018-04-12 NOTE — H&P (Signed)
HISTORY & PHYSICAL    Patient: Joshua Macdonald               Sex: male                Date of Birth:  1995-05-25      Age:  23 y.o.             HPI:     CHIEF COMPLAINT: Abdominal pain diarrhea    HISTORY OF PRESENT ILLNESS:     Joshua Macdonald is a 23 y.o. male who presents with abdominal pain and diarrhea.  Patient states his brother as well as mother both have the same problem.  Patient states his brothers symptoms were much shorter in duration.  Patient has had nausea and vomiting associated with this.  Patient had a CAT scan that was read as an SBO however this does not fit the clinical picture.  Patient states he has been having diarrhea for over 24 hours.      Past Medical History:   Diagnosis Date   ??? Hypertension         History reviewed. No pertinent surgical history.    Social History     Tobacco Use   ??? Smoking status: Never Smoker   ??? Smokeless tobacco: Never Used   Substance Use Topics   ??? Alcohol use: Never     Frequency: Never        History reviewed. No pertinent family history.     Allergies   Allergen Reactions   ??? Sulfa (Sulfonamide Antibiotics) Hives        Prior to Admission medications    Not on File     REVIEW OF SYSTEMS:     CONSTITUTIONAL: No Fever, No chills, No weight loss, No Night sweats  HEENT: No nasal discharge, , No diff in swallowing  CVS: No chest pain, No palpitations, No syncope, No peripheral edema,  RESP: No shortness of breath, No cough, No hemoptysis, No pleuritic chest pain  GI: Abdominal discomfort with nausea vomiting and diarrhea  NEURO: No focal weakness, No headaches, No seizures  PSYCH: No anxiety, No depression  MUSCULOSKLETAL: No joint pain or swelling  GU: No hematuria or dysuria  SKIN: No rash      Physical Exam       HEENT: NC/AT  NECK: No lymphadenopthy, JVD not seen  LYMPH: No supraclavicular or cervical or axillary nodes on both sides  CVS: S1S2, No murmurs, , No gallop or rub  RESP: Bilateral clear to auscultation, No wheezing or crackles   ABD: Soft, mild tenderness not distended, No guarding, No rigidity, BS normal  NEURO:  Non focal, normal strength.  EXTR: no leg edema or leg swelling or clubbing bilateral  SKIN: No rash    Assessment and Plan      Abdominal pain associate with nausea vomiting and diarrhea.  CT scan was read as an SBO.  However clinically patient does not have an SBO with diarrhea.  Patient with 2 family members both with diarrhea as well.  Most likely this is an infectious diarrhea either viral or bacterial.  No SBO present.  No surgical intervention needed.    Philis Pique, MD   04/12/2018  10:44 AM

## 2018-04-12 NOTE — Progress Notes (Signed)
Hospitalist Progress Note         [facility]    Daily Progress Note: 04/12/2018    Assessment/Plan:     1. Abdominal pain: Resolved  -Pt reports abd pain improved today  -CT Reports High grade appearing SBO, but pt reports improved n/v and passing Diarrhea/BM  -Sx has seen and feels clinically not C/w SBO    2. Gastroenteritis:  -Improved n/v/d  -Pt reports sick contacts at home.  -C.diff and Stool Cx pending.  -however reports improving symptoms. Will hold off on empiric Abx.  -Begin Clear liquid diet and advance as tolerate to fulls, then soft bland if no worsened symptoms.    3. Diarrhea, due to #2  -Stool and C.diff pending.  -see above.    4. Ileus and less likely small bowel obstruction: ruled out  -See #1.    5. HTN:   -BP currently tolerable, though mild tachycardia recorded.  -IVF today as below  -Pt reports hx of orthostatic sounding like symptoms chronically but not acutely-monitor for hypotension if diarrhea continues  -Plan to resume HCTZ tomorrow otherwise    6. Hyponatremia:  -Likely due to #2,3  -IVF    7. Hypokalemia:  -Add K+ to IVF  Normal Mag  -Monitor    8. Leukocytosis:  -Suspect due to #2,3  -As seems to be clinically improving, will hold off on empiric Abx, but low threshold to add cipro/flagy if c.diff negative and worse symptoms or fever.    PPX: Low risk, may use SCDs    Dispo:  -Advance diet as tolerated.  -Watch for worsened symptoms  -Monitor labs.  -F/up stool studies.  ??  Subjective:     Pt reports 2 days of n/v/d, but reports feels improved now and is eager to eat.  He denies any abd presently.  Reports BM as recently as 2 hours ago, suggesting against SBO.  Denies fever, and does report sick contact at home.  No recent abx use.  Does report a hx of lightheadedness, but this is subacute to chronic and not acutely worse.    General ROS: (-) for fever, chills  Respiratory ROS: (-) for cough, shortness of breath   Cardiovascular ROS: (+) for intermittent subacute lightheadedness, (-) for chest pain, palpitations, leg swelling  Gastrointestinal ROS: Improved abdominal pain, nausea,vomiting, diarrhea, (-) for melena or hematechezia    Objective:   Physical Exam:     Visit Vitals  BP 132/84 (BP 1 Location: Left arm, BP Patient Position: Supine)   Pulse (!) 105   Temp 97.9 ??F (36.6 ??C)   Resp 16   Ht 6' (1.829 m)   Wt 116.5 kg (256 lb 13.4 oz)   SpO2 98%   BMI 34.83 kg/m??           Temp (24hrs), Avg:98.1 ??F (36.7 ??C), Min:97.9 ??F (36.6 ??C), Max:98.6 ??F (37 ??C)    No intake/output data recorded.   03/05 1901 - 03/07 0700  In: 1050 [I.V.:1050]  Out: -     General: Alert, Obese CM, pleasant to conversation, in no acute distress  CV: Regular rate and rhythm, no murmurs, rubs, gallops  Pulm: Lungs clear to auscultation bilaterally, no focal wheezes, crackles, rhonchi  GI: Soft, nontender, obese/nondistended, mildly hypoactive bowel sounds  Extremity: No clubbing, cyanosis, no pitting lower leg edema, capillary refill time intact to distal fingertips  Skin: Warm, dry    Data Review:       24 Hour Results:  Recent Results (  from the past 24 hour(s))   CBC WITH AUTOMATED DIFF    Collection Time: 04/11/18 10:45 PM   Result Value Ref Range    WBC 17.7 (H) 4.0 - 11.0 1000/mm3    RBC 5.98 (H) 3.80 - 5.70 M/uL    HGB 17.9 (H) 12.4 - 17.2 gm/dl    HCT 49.7 37.0 - 50.0 %    MCV 83.1 80.0 - 98.0 fL    MCH 29.9 23.0 - 34.6 pg    MCHC 36.0 30.0 - 36.0 gm/dl    PLATELET 240 140 - 450 1000/mm3    MPV 12.3 (H) 6.0 - 10.0 fL    RDW-SD 37.2 35.1 - 43.9      NRBC 0 0 - 0      IMMATURE GRANULOCYTES 0.5 0.0 - 3.0 %    NEUTROPHILS 87.8 (H) 34 - 64 %    LYMPHOCYTES 6.5 (L) 28 - 48 %    MONOCYTES 4.9 1 - 13 %    EOSINOPHILS 0.1 0 - 5 %    BASOPHILS 0.2 0 - 3 %   LIPASE    Collection Time: 04/11/18 10:45 PM   Result Value Ref Range    Lipase 94 73 - 656 U/L   METABOLIC PANEL, COMPREHENSIVE    Collection Time: 04/11/18 10:45 PM   Result Value Ref Range     Sodium 132 (L) 136 - 145 mEq/L    Potassium 3.2 (L) 3.5 - 5.1 mEq/L    Chloride 95 (L) 98 - 107 mEq/L    CO2 30 21 - 32 mEq/L    Glucose 145 (H) 74 - 106 mg/dl    BUN 20 7 - 25 mg/dl    Creatinine 1.0 0.6 - 1.3 mg/dl    GFR est AA >60      GFR est non-AA >60      Calcium 9.4 8.5 - 10.1 mg/dl    AST (SGOT) 75 (H) 15 - 37 U/L    ALT (SGPT) 56 12 - 78 U/L    Alk. phosphatase 86 45 - 117 U/L    Bilirubin, total 0.7 0.2 - 1.0 mg/dl    Protein, total 8.6 (H) 6.4 - 8.2 gm/dl    Albumin 4.0 3.4 - 5.0 gm/dl    Anion gap 8 5 - 15 mmol/L   POC URINE MACROSCOPIC    Collection Time: 04/12/18  1:18 AM   Result Value Ref Range    Glucose Negative NEGATIVE,Negative mg/dl    Bilirubin Small (A) NEGATIVE,Negative      Ketone 40 (A) NEGATIVE,Negative mg/dl    Specific gravity 1.025 1.005 - 1.030      Blood Negative NEGATIVE,Negative      pH (UA) 7.0 5 - 9      Protein 100 (A) NEGATIVE,Negative mg/dl    Urobilinogen 1.0 0.0 - 1.0 EU/dl    Nitrites Negative NEGATIVE,Negative      Leukocyte Esterase Negative NEGATIVE,Negative      Color Other      Appearance Clear     MAGNESIUM    Collection Time: 04/12/18  5:25 AM   Result Value Ref Range    Magnesium 2.0 1.6 - 2.6 mg/dl   PHOSPHORUS    Collection Time: 04/12/18  5:25 AM   Result Value Ref Range    Phosphorus 4.3 2.5 - 4.9 mg/dl   LACTIC ACID    Collection Time: 04/12/18  5:25 AM   Result Value Ref Range    Lactic Acid 1.2 0.4 -  2.0 mmol/L       Problem List:  Problem List as of 04/12/2018 Never Reviewed          Codes Class Noted - Resolved    Diarrhea ICD-10-CM: R19.7  ICD-9-CM: 787.91  04/12/2018 - Present        Nausea and vomiting ICD-10-CM: R11.2  ICD-9-CM: 787.01  04/12/2018 - Present        Upper abdominal pain ICD-10-CM: R10.10  ICD-9-CM: 789.09  04/12/2018 - Present               Medications reviewed  Current Facility-Administered Medications   Medication Dose Route Frequency   ??? sodium chloride (NS) flush 5-10 mL  5-10 mL IntraVENous Q8H    ??? sodium chloride (NS) flush 5-10 mL  5-10 mL IntraVENous PRN   ??? naloxone (NARCAN) injection 0.1 mg  0.1 mg IntraVENous PRN   ??? acetaminophen (TYLENOL) tablet 650 mg  650 mg Oral Q6H PRN   ??? morphine injection 2 mg  2 mg IntraVENous Q4H PRN   ??? 0.9% sodium chloride with KCl 20 mEq/L infusion   IntraVENous CONTINUOUS   ??? promethazine (PHENERGAN) 12.5 mg in NS 50 mL IVPB  12.5 mg IntraVENous Q6H PRN   ??? ondansetron (ZOFRAN) injection 4 mg  4 mg IntraVENous Q6H PRN   ??? [START ON 04/13/2018] hydroCHLOROthiazide (HYDRODIURIL) tablet 25 mg  25 mg Oral DAILY        Care Plan discussed with: Patient/Family and Nurse    Total time spent with patient: 28 minutes.    Marzella Schlein, MD  April 12, 2018  11:27

## 2018-04-12 NOTE — ED Notes (Signed)
Patient unable to stand NG tube insertion. This nurse and nurse Selena attempted to place NG tube on patient. Patient was unable to tolerate procedure and refuses to attempt again unless he is sedated.

## 2018-04-12 NOTE — ED Notes (Signed)
Patient unable to stand NG tube insertion. This nurse and nurse Selena attempted to place NG tube on patient. Patient was unable to tolerate procedure and refuses to attempt again unless he is sedated.

## 2018-04-12 NOTE — ED Notes (Signed)
TRANSFER - OUT REPORT:    Verbal report given to [place], Charity fundraiser (name) on Adhiraj Yuill  being transferred to [facility] (unit) for routine progression of care       Report consisted of patient's Situation, Background, Assessment and   Recommendations(SBAR).     Information from the following report(s) SBAR was reviewed with the receiving nurse.    Lines:   Peripheral IV 04/11/18 Right Arm (Active)   Site Assessment Clean, dry, & intact 04/11/2018 11:22 PM   Phlebitis Assessment 0 04/11/2018 11:22 PM   Infiltration Assessment 0 04/11/2018 11:22 PM   Dressing Status Clean, dry, & intact 04/11/2018 11:22 PM   Dressing Type Transparent 04/11/2018 11:22 PM   Hub Color/Line Status Pink 04/11/2018 11:22 PM   Alcohol Cap Used Yes 04/11/2018 11:22 PM        Opportunity for questions and clarification was provided.      Patient transported with:   [employer]

## 2018-04-12 NOTE — H&P (Signed)
HISTORY & PHYSICAL    Patient: Joshua Macdonald               Sex: male                Date of Birth:  10-26-1995      Age:  23 y.o.             HPI:     CHIEF COMPLAINT: Abdominal pain diarrhea    HISTORY OF PRESENT ILLNESS:     Joshua Macdonald is a 23 y.o. male who presents with abdominal pain and diarrhea.  Patient states his brother as well as mother both have the same problem.  Patient states his brothers symptoms were much shorter in duration.  Patient has had nausea and vomiting associated with this.  Patient had a CAT scan that was read as an SBO however this does not fit the clinical picture.  Patient states he has been having diarrhea for over 24 hours.      Past Medical History:   Diagnosis Date   ??? Hypertension         History reviewed. No pertinent surgical history.    Social History     Tobacco Use   ??? Smoking status: Never Smoker   ??? Smokeless tobacco: Never Used   Substance Use Topics   ??? Alcohol use: Never     Frequency: Never        History reviewed. No pertinent family history.     Allergies   Allergen Reactions   ??? Sulfa (Sulfonamide Antibiotics) Hives        Prior to Admission medications    Not on File     REVIEW OF SYSTEMS:     CONSTITUTIONAL: No Fever, No chills, No weight loss, No Night sweats  HEENT: No nasal discharge, , No diff in swallowing  CVS: No chest pain, No palpitations, No syncope, No peripheral edema,  RESP: No shortness of breath, No cough, No hemoptysis, No pleuritic chest pain  GI: Abdominal discomfort with nausea vomiting and diarrhea  NEURO: No focal weakness, No headaches, No seizures  PSYCH: No anxiety, No depression  MUSCULOSKLETAL: No joint pain or swelling  GU: No hematuria or dysuria  SKIN: No rash      Physical Exam       HEENT: NC/AT  NECK: No lymphadenopthy, JVD not seen  LYMPH: No supraclavicular or cervical or axillary nodes on both sides  CVS: S1S2, No murmurs, , No gallop or rub  RESP: Bilateral clear to auscultation, No wheezing or crackles  ABD: Soft, mild tenderness  not distended, No guarding, No rigidity, BS normal  NEURO:  Non focal, normal strength.  EXTR: no leg edema or leg swelling or clubbing bilateral  SKIN: No rash    Assessment and Plan      Abdominal pain associate with nausea vomiting and diarrhea.  CT scan was read as an SBO.  However clinically patient does not have an SBO with diarrhea.  Patient with 2 family members both with diarrhea as well.  Most likely this is an infectious diarrhea either viral or bacterial.  No SBO present.  No surgical intervention needed.    Philis Pique, MD   04/12/2018  10:44 AM

## 2018-04-12 NOTE — Progress Notes (Signed)
CM spoke with pt in room. Pt lives with his mother and father. Plan is to return home when duicharged. Pt will drive self home. Pt used no DME's prior to admission. No needs noted at this time. Will continue to follow for any changes in discharge plans.     Case Management Assessment      Preferred Language for Healthcare Related Communication         Spiritual/Ethnic/Cultural/Religious Needs that Should be Incorporated Into Your Care          FUNCTIONAL ASSESSMENT   Fall in Past 12 Months: No               Decline in Gait/Transfer/Balance: No       Decline in Capacity to Feed/Dress/Bathe: No   Developmental Delay: No   Chewing/Swallowing Problems: No      DYSPHAGIA SCREENING                          Difficulty with Secretions     Difficulty with Secretions: No      Speech Slurred/Thick/Garbled     Speech Slurred/Thick/Garbled: No      ABUSE/NEGLECT SCREENING   Physical Abuse/Neglect: Denies           Other Abuse/Issues: Denies          PRIMARY DECISION MAKER                                        ADVANCE CARE PLANNING (ACP) DOCUMENTS   Confirm Advance Directive: None              Suicide/Psychosocial Screening                                      SAD PERSONS                                                  READMIT RISK TOOL   Support Systems: Family member(s)       History of Falls Within Past 3 Months: No   Needs Assistance with Wound Care AND/OR Mgnt of O2, Nebulizer: No   Requires Financial, Physical and/or Educational Assistance With Medications: Yes   History of Mental Illness: No   Living Alone: No      CARE MANAGEMENT INTERVENTIONS       PCP Verified by CM: Yes(James Alstin)           Mode of Transport at Discharge: Self       Transition of Care Consult (CM Consult): Discharge Planning   Comfort Care: No       Reason Outside Comfort Care Chosen: Other (see comment)(No HH ordered)   MyChart Signup: No   Discharge Durable Medical Equipment: No   Physical Therapy Consult: No   Occupational Therapy Consult: No    Speech Therapy Consult: No   Current Support Network: Relative's Home   Reason for Referral: DCP Rounds   History Provided By: Patient   Patient Orientation: Alert and Oriented   Cognition: Alert       Previous Living Arrangement: Lives with Family Independent  Prior Functional Level: Independent in ADLs/IADLs   Current Functional Level: Independent in ADLs/IADLs       Can patient return to prior living arrangement: Yes   Ability to make needs known:: Good   Family able to assist with home care needs:: Yes   Pets: None           Types of Needs Identified: Disease Management Education       Confirm Follow Up Transport: Self   Confirm Transport and Arrange: Yes              DISCHARGE LOCATION   Discharge Placement: Home     3

## 2018-04-12 NOTE — Progress Notes (Signed)
Hospitalist Progress Note         [facility]    Daily Progress Note: 04/12/2018    Assessment/Plan:     1. Abdominal pain: Resolved  -Pt reports abd pain improved today  -CT Reports High grade appearing SBO, but pt reports improved n/v and passing Diarrhea/BM  -Sx has seen and feels clinically not C/w SBO    2. Gastroenteritis:  -Improved n/v/d  -Pt reports sick contacts at home.  -C.diff and Stool Cx pending.  -however reports improving symptoms. Will hold off on empiric Abx.  -Begin Clear liquid diet and advance as tolerate to fulls, then soft bland if no worsened symptoms.    3. Diarrhea, due to #2  -Stool and C.diff pending.  -see above.    4. Ileus and less likely small bowel obstruction: ruled out  -See #1.    5. HTN:   -BP currently tolerable, though mild tachycardia recorded.  -IVF today as below  -Pt reports hx of orthostatic sounding like symptoms chronically but not acutely-monitor for hypotension if diarrhea continues  -Plan to resume HCTZ tomorrow otherwise    6. Hyponatremia:  -Likely due to #2,3  -IVF    7. Hypokalemia:  -Add K+ to IVF  Normal Mag  -Monitor    8. Leukocytosis:  -Suspect due to #2,3  -As seems to be clinically improving, will hold off on empiric Abx, but low threshold to add cipro/flagy if c.diff negative and worse symptoms or fever.    PPX: Low risk, may use SCDs    Dispo:  -Advance diet as tolerated.  -Watch for worsened symptoms  -Monitor labs.  -F/up stool studies.  ??  Subjective:     Pt reports 2 days of n/v/d, but reports feels improved now and is eager to eat.  He denies any abd presently.  Reports BM as recently as 2 hours ago, suggesting against SBO.  Denies fever, and does report sick contact at home.  No recent abx use.  Does report a hx of lightheadedness, but this is subacute to chronic and not acutely worse.    General ROS: (-) for fever, chills  Respiratory ROS: (-) for cough, shortness of breath  Cardiovascular ROS: (+) for intermittent subacute lightheadedness,  (-) for chest pain, palpitations, leg swelling  Gastrointestinal ROS: Improved abdominal pain, nausea,vomiting, diarrhea, (-) for melena or hematechezia    Objective:   Physical Exam:     Visit Vitals  BP 132/84 (BP 1 Location: Left arm, BP Patient Position: Supine)   Pulse (!) 105   Temp 97.9 ??F (36.6 ??C)   Resp 16   Ht 6' (1.829 m)   Wt 116.5 kg (256 lb 13.4 oz)   SpO2 98%   BMI 34.83 kg/m??           Temp (24hrs), Avg:98.1 ??F (36.7 ??C), Min:97.9 ??F (36.6 ??C), Max:98.6 ??F (37 ??C)    No intake/output data recorded.   03/05 1901 - 03/07 0700  In: 1050 [I.V.:1050]  Out: -     General: Alert, Obese CM, pleasant to conversation, in no acute distress  CV: Regular rate and rhythm, no murmurs, rubs, gallops  Pulm: Lungs clear to auscultation bilaterally, no focal wheezes, crackles, rhonchi  GI: Soft, nontender, obese/nondistended, mildly hypoactive bowel sounds  Extremity: No clubbing, cyanosis, no pitting lower leg edema, capillary refill time intact to distal fingertips  Skin: Warm, dry    Data Review:       24 Hour Results:  Recent Results (  from the past 24 hour(s))   CBC WITH AUTOMATED DIFF    Collection Time: 04/11/18 10:45 PM   Result Value Ref Range    WBC 17.7 (H) 4.0 - 11.0 1000/mm3    RBC 5.98 (H) 3.80 - 5.70 M/uL    HGB 17.9 (H) 12.4 - 17.2 gm/dl    HCT 49.7 37.0 - 50.0 %    MCV 83.1 80.0 - 98.0 fL    MCH 29.9 23.0 - 34.6 pg    MCHC 36.0 30.0 - 36.0 gm/dl    PLATELET 240 140 - 450 1000/mm3    MPV 12.3 (H) 6.0 - 10.0 fL    RDW-SD 37.2 35.1 - 43.9      NRBC 0 0 - 0      IMMATURE GRANULOCYTES 0.5 0.0 - 3.0 %    NEUTROPHILS 87.8 (H) 34 - 64 %    LYMPHOCYTES 6.5 (L) 28 - 48 %    MONOCYTES 4.9 1 - 13 %    EOSINOPHILS 0.1 0 - 5 %    BASOPHILS 0.2 0 - 3 %   LIPASE    Collection Time: 04/11/18 10:45 PM   Result Value Ref Range    Lipase 94 73 - 518 U/L   METABOLIC PANEL, COMPREHENSIVE    Collection Time: 04/11/18 10:45 PM   Result Value Ref Range    Sodium 132 (L) 136 - 145 mEq/L    Potassium 3.2 (L) 3.5 - 5.1 mEq/L     Chloride 95 (L) 98 - 107 mEq/L    CO2 30 21 - 32 mEq/L    Glucose 145 (H) 74 - 106 mg/dl    BUN 20 7 - 25 mg/dl    Creatinine 1.0 0.6 - 1.3 mg/dl    GFR est AA >60      GFR est non-AA >60      Calcium 9.4 8.5 - 10.1 mg/dl    AST (SGOT) 75 (H) 15 - 37 U/L    ALT (SGPT) 56 12 - 78 U/L    Alk. phosphatase 86 45 - 117 U/L    Bilirubin, total 0.7 0.2 - 1.0 mg/dl    Protein, total 8.6 (H) 6.4 - 8.2 gm/dl    Albumin 4.0 3.4 - 5.0 gm/dl    Anion gap 8 5 - 15 mmol/L   POC URINE MACROSCOPIC    Collection Time: 04/12/18  1:18 AM   Result Value Ref Range    Glucose Negative NEGATIVE,Negative mg/dl    Bilirubin Small (A) NEGATIVE,Negative      Ketone 40 (A) NEGATIVE,Negative mg/dl    Specific gravity 1.025 1.005 - 1.030      Blood Negative NEGATIVE,Negative      pH (UA) 7.0 5 - 9      Protein 100 (A) NEGATIVE,Negative mg/dl    Urobilinogen 1.0 0.0 - 1.0 EU/dl    Nitrites Negative NEGATIVE,Negative      Leukocyte Esterase Negative NEGATIVE,Negative      Color Other      Appearance Clear     MAGNESIUM    Collection Time: 04/12/18  5:25 AM   Result Value Ref Range    Magnesium 2.0 1.6 - 2.6 mg/dl   PHOSPHORUS    Collection Time: 04/12/18  5:25 AM   Result Value Ref Range    Phosphorus 4.3 2.5 - 4.9 mg/dl   LACTIC ACID    Collection Time: 04/12/18  5:25 AM   Result Value Ref Range    Lactic Acid 1.2 0.4 -  2.0 mmol/L       Problem List:  Problem List as of 04/12/2018 Never Reviewed          Codes Class Noted - Resolved    Diarrhea ICD-10-CM: R19.7  ICD-9-CM: 787.91  04/12/2018 - Present        Nausea and vomiting ICD-10-CM: R11.2  ICD-9-CM: 787.01  04/12/2018 - Present        Upper abdominal pain ICD-10-CM: R10.10  ICD-9-CM: 789.09  04/12/2018 - Present               Medications reviewed  Current Facility-Administered Medications   Medication Dose Route Frequency   ??? sodium chloride (NS) flush 5-10 mL  5-10 mL IntraVENous Q8H   ??? sodium chloride (NS) flush 5-10 mL  5-10 mL IntraVENous PRN   ??? naloxone (NARCAN) injection 0.1 mg  0.1 mg  IntraVENous PRN   ??? acetaminophen (TYLENOL) tablet 650 mg  650 mg Oral Q6H PRN   ??? morphine injection 2 mg  2 mg IntraVENous Q4H PRN   ??? 0.9% sodium chloride with KCl 20 mEq/L infusion   IntraVENous CONTINUOUS   ??? promethazine (PHENERGAN) 12.5 mg in NS 50 mL IVPB  12.5 mg IntraVENous Q6H PRN   ??? ondansetron (ZOFRAN) injection 4 mg  4 mg IntraVENous Q6H PRN   ??? [START ON 04/13/2018] hydroCHLOROthiazide (HYDRODIURIL) tablet 25 mg  25 mg Oral DAILY        Care Plan discussed with: Patient/Family and Nurse    Total time spent with patient: 28 minutes.    Marzella Schlein, MD  April 12, 2018  11:27

## 2018-04-12 NOTE — H&P (Signed)
Medicine History and Physical    Patient: Joshua Macdonald Age: 23 y.o. Sex: male    Date of Birth: Dec 20, 1995 Admit Date: 04/11/2018 PCP: Alma Downs, Not On File   MRN: [id number]  CSN: [id number]         Assessment   Abdominal pain, LUQ (left upper quadrant) [R10.12]  Diarrhea, unspecified type [R19.7]  Ileus and less likely small bowel obstruction   Suspect Gastroenteritis   Nausea and vomiting, intractability of vomiting not specified, unspecified vomiting type [R11.2]  HTN       Plan   IVF   Pain management   Stool culture, Cdiff   Trend WBC and fever curve   Check Mg, Phos, lactic acid   NGT placed   General Sx consulted in ED   Diet NPO   DVT PPX SCD   ACP: CODE STATUS FULL CODE       Chief Complaint:  Chief Complaint   Patient presents with   ??? Vomiting   ??? Abdominal Pain   ??? Diarrhea         HPI:   Joshua Macdonald is a 23 y.o. year old male who presents with nausea, vomiting and diarrhea since five PM. Hx of appendectomy and cholecystectomy. Patient has family members with similar symptoms. WBC elevated. In ED, given IVF, Zofran, Pepcid, phenergan. CT abdomen and pelvis shows dilated Small bowel loops and possible obstruction and patient having diarrhea. General Surgery consulted.       Review of Systems - 12 Point ROS -ve except what is noted in the HPI.     Past Medical History:  Past Medical History:   Diagnosis Date   ??? Hypertension        Past Surgical History:  History reviewed. No pertinent surgical history.    Family History:  History reviewed. No pertinent family history.    Social History:  Social History     Socioeconomic History   ??? Marital status: SINGLE     Spouse name: Not on file   ??? Number of children: Not on file   ??? Years of education: Not on file   ??? Highest education level: Not on file   Tobacco Use   ??? Smoking status: Never Smoker   ??? Smokeless tobacco: Never Used   Substance and Sexual Activity   ??? Alcohol use: Never     Frequency: Never   ??? Drug use: Never       Home Medications:  Prior to Admission  medications    Not on File       Allergies:  Allergies   Allergen Reactions   ??? Sulfa (Sulfonamide Antibiotics) Hives         Physical Exam:     Visit Vitals  BP 136/90   Pulse 92   Temp 98.6 ??F (37 ??C)   Resp 16   Ht 6' (1.829 m)   Wt 117 kg (258 lb)   SpO2 96%   BMI 34.99 kg/m??       Physical Exam:  General appearance: alert, cooperative, no distress, appears stated age  Head: Normocephalic, without obvious abnormality, atraumatic  Neck: supple, trachea midline  Lungs: clear to auscultation bilaterally  Heart: regular rate and rhythm, S1, S2 normal, no murmur, click, rub or gallop  Abdomen: soft, non-tender. Bowel sounds normal. No masses,  no organomegaly  Extremities: extremities normal, atraumatic, no cyanosis or edema  Skin: Skin color, texture, turgor normal. No rashes or lesions  Neurologic: Grossly normal  Intake and Output:  Current Shift:  03/06 1901 - 03/07 0700  In: 1050 [I.V.:1050]  Out: -   Last three shifts:  No intake/output data recorded.    Lab/Data Reviewed:  Lab:   Recent Results (from the past 12 hour(s))   CBC WITH AUTOMATED DIFF    Collection Time: 04/11/18 10:45 PM   Result Value Ref Range    WBC 17.7 (H) 4.0 - 11.0 1000/mm3    RBC 5.98 (H) 3.80 - 5.70 M/uL    HGB 17.9 (H) 12.4 - 17.2 gm/dl    HCT 49.7 37.0 - 50.0 %    MCV 83.1 80.0 - 98.0 fL    MCH 29.9 23.0 - 34.6 pg    MCHC 36.0 30.0 - 36.0 gm/dl    PLATELET 240 140 - 450 1000/mm3    MPV 12.3 (H) 6.0 - 10.0 fL    RDW-SD 37.2 35.1 - 43.9      NRBC 0 0 - 0      IMMATURE GRANULOCYTES 0.5 0.0 - 3.0 %    NEUTROPHILS 87.8 (H) 34 - 64 %    LYMPHOCYTES 6.5 (L) 28 - 48 %    MONOCYTES 4.9 1 - 13 %    EOSINOPHILS 0.1 0 - 5 %    BASOPHILS 0.2 0 - 3 %   LIPASE    Collection Time: 04/11/18 10:45 PM   Result Value Ref Range    Lipase 94 73 - 341 U/L   METABOLIC PANEL, COMPREHENSIVE    Collection Time: 04/11/18 10:45 PM   Result Value Ref Range    Sodium 132 (L) 136 - 145 mEq/L    Potassium 3.2 (L) 3.5 - 5.1 mEq/L    Chloride 95 (L) 98 - 107 mEq/L     CO2 30 21 - 32 mEq/L    Glucose 145 (H) 74 - 106 mg/dl    BUN 20 7 - 25 mg/dl    Creatinine 1.0 0.6 - 1.3 mg/dl    GFR est AA >60      GFR est non-AA >60      Calcium 9.4 8.5 - 10.1 mg/dl    AST (SGOT) 75 (H) 15 - 37 U/L    ALT (SGPT) 56 12 - 78 U/L    Alk. phosphatase 86 45 - 117 U/L    Bilirubin, total 0.7 0.2 - 1.0 mg/dl    Protein, total 8.6 (H) 6.4 - 8.2 gm/dl    Albumin 4.0 3.4 - 5.0 gm/dl    Anion gap 8 5 - 15 mmol/L   POC URINE MACROSCOPIC    Collection Time: 04/12/18  1:18 AM   Result Value Ref Range    Glucose Negative NEGATIVE,Negative mg/dl    Bilirubin Small (A) NEGATIVE,Negative      Ketone 40 (A) NEGATIVE,Negative mg/dl    Specific gravity 1.025 1.005 - 1.030      Blood Negative NEGATIVE,Negative      pH (UA) 7.0 5 - 9      Protein 100 (A) NEGATIVE,Negative mg/dl    Urobilinogen 1.0 0.0 - 1.0 EU/dl    Nitrites Negative NEGATIVE,Negative      Leukocyte Esterase Negative NEGATIVE,Negative      Color Other      Appearance Clear         Imaging:    No results found.      Rosalene Billings, MD  April 12, 2018

## 2018-04-12 NOTE — Progress Notes (Signed)
RN paged hospitalist to see if the patient is able to change their diet from NPO since surgeon spoke with patient this morning and told them they do not believe it is bowel obstruction.  Awaiting callback.

## 2018-04-13 LAB — COMPREHENSIVE METABOLIC PANEL
ALT: 38 U/L (ref 12–78)
AST: 52 U/L — ABNORMAL HIGH (ref 15–37)
Albumin: 2.8 gm/dl — ABNORMAL LOW (ref 3.4–5.0)
Alkaline Phosphatase: 60 U/L (ref 45–117)
Anion Gap: 6 mmol/L (ref 5–15)
BUN: 9 mg/dl (ref 7–25)
CO2: 28 mEq/L (ref 21–32)
Calcium: 8.4 mg/dl — ABNORMAL LOW (ref 8.5–10.1)
Chloride: 104 mEq/L (ref 98–107)
Creatinine: 0.8 mg/dl (ref 0.6–1.3)
EGFR IF NonAfrican American: >60
GFR African American: >60
Glucose: 89 mg/dl (ref 74–106)
Potassium: 3.8 mEq/L (ref 3.5–5.1)
Sodium: 137 mEq/L (ref 136–145)
Total Bilirubin: 0.3 mg/dl (ref 0.2–1.0)
Total Protein: 6.2 gm/dl — ABNORMAL LOW (ref 6.4–8.2)

## 2018-04-13 LAB — CBC WITH AUTO DIFFERENTIAL
Basophils %: 0.4 % (ref 0–3)
Eosinophils %: 0.7 % (ref 0–5)
Hematocrit: 43.3 % (ref 37.0–50.0)
Hemoglobin: 15 gm/dl (ref 12.4–17.2)
Immature Granulocytes: 0.4 % (ref 0.0–3.0)
Lymphocytes %: 29.2 % (ref 28–48)
MCH: 29.5 pg (ref 23.0–34.6)
MCHC: 34.6 gm/dl (ref 30.0–36.0)
MCV: 85.1 fL (ref 80.0–98.0)
MPV: 12.3 fL — ABNORMAL HIGH (ref 6.0–10.0)
Monocytes %: 12.1 % (ref 1–13)
Neutrophils %: 57.2 % (ref 34–64)
Nucleated RBCs: 0 (ref 0–0)
Platelets: 216 10*3/uL (ref 140–450)
RBC: 5.09 M/uL (ref 3.80–5.70)
RDW-SD: 39 (ref 35.1–43.9)
WBC: 8.4 10*3/uL (ref 4.0–11.0)

## 2018-04-13 LAB — CBC WITH AUTOMATED DIFF
BASOPHILS: 0.4 % (ref 0–3)
EOSINOPHILS: 0.7 % (ref 0–5)
HCT: 43.3 % (ref 37.0–50.0)
HGB: 15 gm/dl (ref 12.4–17.2)
IMMATURE GRANULOCYTES: 0.4 % (ref 0.0–3.0)
LYMPHOCYTES: 29.2 % (ref 28–48)
MCH: 29.5 pg (ref 23.0–34.6)
MCHC: 34.6 gm/dl (ref 30.0–36.0)
MCV: 85.1 fL (ref 80.0–98.0)
MONOCYTES: 12.1 % (ref 1–13)
MPV: 12.3 fL — ABNORMAL HIGH (ref 6.0–10.0)
NEUTROPHILS: 57.2 % (ref 34–64)
NRBC: 0 (ref 0–0)
PLATELET: 216 10*3/uL (ref 140–450)
RBC: 5.09 M/uL (ref 3.80–5.70)
RDW-SD: 39 (ref 35.1–43.9)
WBC: 8.4 10*3/uL (ref 4.0–11.0)

## 2018-04-13 LAB — METABOLIC PANEL, COMPREHENSIVE
ALT (SGPT): 38 U/L (ref 12–78)
AST (SGOT): 52 U/L — ABNORMAL HIGH (ref 15–37)
Albumin: 2.8 gm/dl — ABNORMAL LOW (ref 3.4–5.0)
Alk. phosphatase: 60 U/L (ref 45–117)
Anion gap: 6 mmol/L (ref 5–15)
BUN: 9 mg/dl (ref 7–25)
Bilirubin, total: 0.3 mg/dl (ref 0.2–1.0)
CO2: 28 mEq/L (ref 21–32)
Calcium: 8.4 mg/dl — ABNORMAL LOW (ref 8.5–10.1)
Chloride: 104 mEq/L (ref 98–107)
Creatinine: 0.8 mg/dl (ref 0.6–1.3)
GFR est AA: >60
GFR est non-AA: >60
Glucose: 89 mg/dl (ref 74–106)
Potassium: 3.8 mEq/L (ref 3.5–5.1)
Protein, total: 6.2 gm/dl — ABNORMAL LOW (ref 6.4–8.2)
Sodium: 137 mEq/L (ref 136–145)

## 2018-04-13 MED ORDER — PROMETHAZINE 25 MG TAB
25 mg | ORAL_TABLET | Freq: Four times a day (QID) | ORAL | 0 refills | Status: AC | PRN
Start: 2018-04-13 — End: ?

## 2018-04-13 MED FILL — NS WITH POTASSIUM CHLORIDE 20 MEQ/L IV: 20 mEq/L | INTRAVENOUS | Qty: 1000

## 2018-04-13 MED FILL — HYDROCHLOROTHIAZIDE 25 MG TAB: 25 mg | ORAL | Qty: 1

## 2018-04-13 NOTE — Progress Notes (Signed)
The beginning of Daylight Saving Time occurred at 0200 hrs. Documentation of patient care and medications administered is done with respect to the time change.

## 2018-04-13 NOTE — Progress Notes (Signed)
Discharge Date: 04/13/2018    Discharge Location: Home    Discharge Needs: Good RX card for Phenergan RX , TCC referral     Transportation: Friend will transport home     Communication with: Patient, RN, MD

## 2018-04-13 NOTE — Other (Signed)
Bedside and Verbal shift change report given to Megan, RN (oncoming nurse) by Caroline, RN (offgoing nurse). Report included the following information SBAR, Kardex, Intake/Output, MAR and Recent Results.

## 2018-04-13 NOTE — Progress Notes (Signed)
Patient is not in room to receive discharge paperwork.  Patient left without them.

## 2018-04-13 NOTE — Other (Addendum)
----------  DocumentID: [id number]------------------------------------------------              [facility]                       Patient Education Report         Name: ZAYAN, SEPPALA                  Date: 04/12/2018    MRN: [id number]                    Time: 4:41:18 AM         Patient ordered video: 'Patient Safety: Stay Safe While you are in the Hospital'    from [id number] via phone number: [phone] at 4:41:18 AM    Description: This program outlines some of the precautions patients can take to ensure a speedy recovery without extra complications. The video emphasizes the importance of communicating with the healthcare team.    ----------DocumentID: [id number]------------------------------------------------                       [facility]          Patient Education Report - Discharge Summary        Date: 04/13/2018   Time: 11:24:13 AM   Name: ORLAND, CARIKER   MRN: [id number]      Account Number: [id number]      Education History:        Patient ordered video: 'Patient Safety: Stay Safe While you are in the Hospital' from [id number] on 04/12/2018 04:41:18 AM

## 2018-04-13 NOTE — Discharge Summary (Signed)
Hospitalist Discharge Summary      Discharge Summary   Admit Date: 04/11/2018  Discharge Date:  04/13/18      Patient ID:  Joshua Macdonald  23 y.o.  02/20/1995    Chief Complaint   Patient presents with   ??? Vomiting   ??? Abdominal Pain   ??? Diarrhea       Discharge Diagnoses  1. Abdominal pain: Resolved  -suspect due to #2.  -Pt reports abd pain improved today  -CT Reports High grade appearing SBO, but pt reports improved n/v and passing Diarrhea/BM  -Sx has seen and feels clinically not C/w SBO.  -tolerating PO.  ??  2. Gastroenteritis:  -No further n/v/d  -Pt reports sick contacts at home.  -C.diff negative but stool Cx pending  -Will given PO promethazine prn at discharge.  -Mild AST elevation on intake (75), improved to 52 today, with otherwise normal LFTs except mildly low TP, Alb.  ??  3. Diarrhea, due to #2  -see above.  -Will follow stool Cx and update pt If positive at [phone], but given improvement off Abx, do not suspect bacterial diarrhea.  ??  4. Ileus and less likely small bowel obstruction: ruled out  -See #1.  ??  5. HTN:   -continue home meds  ??  6. Hyponatremia: Resolved  ??  7. Hypokalemia:  -Resolved  ??  8. Leukocytosis: Resolved  -Suspect due to #2,3  -Improved from 17.7 to 8.4 without Abx.    Current Discharge Medication List      START taking these medications    Details   promethazine (PHENERGAN) 25 mg tablet Take 1 Tab by mouth every six (6) hours as needed for Nausea. Indications: nausea/vomiting  Qty: 15 Tab, Refills: 0         CONTINUE these medications which have NOT CHANGED    Details   hydroCHLOROthiazide (HYDRODIURIL) 25 mg tablet Take 25 mg by mouth daily.      omega 3-DHA-EPA-fish oil (FISH OIL) 1,000 mg (120 mg-180 mg) capsule Take 1 Cap by mouth daily.               Initial presentation:  From HPI:  Joshua Macdonald is a 23 y.o. year old male who presents with nausea, vomiting and diarrhea since five PM. Hx of appendectomy and  cholecystectomy. Patient has family members with similar symptoms. WBC elevated. In ED, given IVF, Zofran, Pepcid, phenergan. CT abdomen and pelvis shows dilated Small bowel loops and possible obstruction and patient having diarrhea. General Surgery consulted.     Patient was admitted with plans for IV fluids, pain management, n.p.o. status, stool culture, C. difficile, to monitor electrolytes and white blood cell count, attempted NG tube placement and general surgery consultation.    Hospital Course:   Patient did not tolerate NG tube and this was declined on the morning of the seventh.  General surgery evaluated and noted that CT scan although read a small bowel obstruction did not seem to be clinically consistent with his picture including diarrhea and also noted sick family members at home and suspected a infectious diarrhea and felt there was no small bowel obstruction present and no surgical intervention needed at this time.  When seen by the medicine service later on hospital day 1 the patient reported no further nausea vomiting or diarrhea and he was eager to eat.  Abdominal pain improved.  He was placed on a clear liquid diet with plans to advance as tolerated.  C. difficile  assay was negative.  He has been able tolerate advancement to a full liquid diet without further symptoms of nausea vomiting or diarrhea or abdominal pain.  He otherwise reports feeling well.  Leukocytosis is resolved.  Electrolyte abnormalities have improved.  He otherwise reports feeling well and is plan for discharge today    Consultants:   Dr. Jonathon Jordan of General Sx.    Imaging:  Ct Abd Pelv W Cont    Result Date: 04/12/2018  DICOM format image data is available to nonaffiliated external healthcare facilities or entities on a secure, media free, reciprocally searchable basis with patient authorization for 12 months following the date of the study. TECHNIQUE: CT of the abdomen and pelvis WITH intravenous contrast.   The abdomen and pelvis were scanned utilizing a multidetector helical scanner from the diaphragm to the lesser trochanter without the oral administration of barium.  Coronal and sagittal reformations were obtained. COMPARISON: none INDICATION: Upper abdominal pain and vomiting DISCUSSION: LOWER THORAX: Dependent atelectasis bilateral lower lobes. HEPATOBILIARY: Cholecystectomy. SPLEEN: No splenomegaly or focal lesion. PANCREAS: No focal masses or ductal dilatation. ADRENALS: No adrenal nodules. KIDNEYS/URETERS: No hydronephrosis, stones, or solid mass lesions. PELVIC ORGANS/BLADDER: Unremarkable. PERITONEUM / RETROPERITONEUM: No free air or fluid. LYMPH NODES: No lymphadenopathy. VESSELS: Unremarkable. GI TRACT: Dilated jejunal and proximal ileal bowel loops up to 3.9 cm diameter with nondilated distal ileal bowel loops. Transition point not clear. Some swirling of mesenteric vessels noted centrally. No pneumatosis or significant bowel wall thickening. Normal bowel wall enhancement noted. Colon nondistended. BONES AND SOFT TISSUES: Unremarkable.     IMPRESSION: High-grade small bowel obstruction. Internal hernia, closed loop obstruction not completely excluded apparent swirling of mesenteric vessels although no clear evidence of bowel ischemia seen at this time. Cholecystectomy.      Physical Exam on Discharge:  Visit Vitals  BP 109/62 (BP 1 Location: Right arm, BP Patient Position: Supine)   Pulse 75   Temp 97.9 ??F (36.6 ??C)   Resp 16   Ht 6' (1.829 m)   Wt 116.5 kg (256 lb 13.4 oz)   SpO2 100%   BMI 34.83 kg/m??     General: Alert, Obese CM, pleasant to conversation, in no acute distress  CV: Regular rate and rhythm, no murmurs, rubs, gallops  Pulm: Lungs clear to auscultation bilaterally, no focal wheezes, crackles, rhonchi  GI: Soft, nontender, obese/nondistended, mildly hypoactive bowel sounds  Extremity: No clubbing, cyanosis, no pitting lower leg edema, capillary refill time intact to distal fingertips   Skin: Warm, dry    Most Recent Labs:      Recent Results (from the past 24 hour(s))   CBC WITH AUTOMATED DIFF    Collection Time: 04/13/18  7:44 AM   Result Value Ref Range    WBC 8.4 4.0 - 11.0 1000/mm3    RBC 5.09 3.80 - 5.70 M/uL    HGB 15.0 12.4 - 17.2 gm/dl    HCT 43.3 37.0 - 50.0 %    MCV 85.1 80.0 - 98.0 fL    MCH 29.5 23.0 - 34.6 pg    MCHC 34.6 30.0 - 36.0 gm/dl    PLATELET 216 140 - 450 1000/mm3    MPV 12.3 (H) 6.0 - 10.0 fL    RDW-SD 39.0 35.1 - 43.9      NRBC 0 0 - 0      IMMATURE GRANULOCYTES 0.4 0.0 - 3.0 %    NEUTROPHILS 57.2 34 - 64 %    LYMPHOCYTES 29.2 28 -  48 %    MONOCYTES 12.1 1 - 13 %    EOSINOPHILS 0.7 0 - 5 %    BASOPHILS 0.4 0 - 3 %   METABOLIC PANEL, COMPREHENSIVE    Collection Time: 04/13/18  7:44 AM   Result Value Ref Range    Sodium 137 136 - 145 mEq/L    Potassium 3.8 3.5 - 5.1 mEq/L    Chloride 104 98 - 107 mEq/L    CO2 28 21 - 32 mEq/L    Glucose 89 74 - 106 mg/dl    BUN 9 7 - 25 mg/dl    Creatinine 0.8 0.6 - 1.3 mg/dl    GFR est AA >60      GFR est non-AA >60      Calcium 8.4 (L) 8.5 - 10.1 mg/dl    AST (SGOT) 52 (H) 15 - 37 U/L    ALT (SGPT) 38 12 - 78 U/L    Alk. phosphatase 60 45 - 117 U/L    Bilirubin, total 0.3 0.2 - 1.0 mg/dl    Protein, total 6.2 (L) 6.4 - 8.2 gm/dl    Albumin 2.8 (L) 3.4 - 5.0 gm/dl    Anion gap 6 5 - 15 mmol/L         Condition at discharge: Stable.     Disposition:  Home      PCP:  [facility], Not On File, f/up with either PCP, or if no PCP then Transitional care clinic in 1-2 weeks.      Total time for DC was 28 minutes.      Marzella Schlein, MD  April 13, 2018  10:15

## 2018-04-13 NOTE — Discharge Summary (Signed)
Hospitalist Discharge Summary      Discharge Summary   Admit Date: 04/11/2018  Discharge Date:  04/13/18      Patient ID:  Joshua Macdonald  23 y.o.  [birth year]    Chief Complaint   Patient presents with   ??? Vomiting   ??? Abdominal Pain   ??? Diarrhea       Discharge Diagnoses  1. Abdominal pain: Resolved  -suspect due to #2.  -Pt reports abd pain improved today  -CT Reports High grade appearing SBO, but pt reports improved n/v and passing Diarrhea/BM  -Sx has seen and feels clinically not C/w SBO.  -tolerating PO.  ??  2. Gastroenteritis:  -No further n/v/d  -Pt reports sick contacts at home.  -C.diff negative but stool Cx pending  -Will given PO promethazine prn at discharge.  -Mild AST elevation on intake (75), improved to 52 today, with otherwise normal LFTs except mildly low TP, Alb.  ??  3. Diarrhea, due to #2  -see above.  -Will follow stool Cx and update pt If positive at [phone], but given improvement off Abx, do not suspect bacterial diarrhea.  ??  4. Ileus and less likely small bowel obstruction: ruled out  -See #1.  ??  5. HTN:   -continue home meds  ??  6. Hyponatremia: Resolved  ??  7. Hypokalemia:  -Resolved  ??  8. Leukocytosis: Resolved  -Suspect due to #2,3  -Improved from 17.7 to 8.4 without Abx.    Current Discharge Medication List      START taking these medications    Details   promethazine (PHENERGAN) 25 mg tablet Take 1 Tab by mouth every six (6) hours as needed for Nausea. Indications: nausea/vomiting  Qty: 15 Tab, Refills: 0         CONTINUE these medications which have NOT CHANGED    Details   hydroCHLOROthiazide (HYDRODIURIL) 25 mg tablet Take 25 mg by mouth daily.      omega 3-DHA-EPA-fish oil (FISH OIL) 1,000 mg (120 mg-180 mg) capsule Take 1 Cap by mouth daily.               Initial presentation:  From HPI:  Joshua Macdonald is a 23 y.o. year old male who presents with nausea, vomiting and diarrhea since five PM. Hx of appendectomy and cholecystectomy. Patient has family members with similar symptoms.  WBC elevated. In ED, given IVF, Zofran, Pepcid, phenergan. CT abdomen and pelvis shows dilated Small bowel loops and possible obstruction and patient having diarrhea. General Surgery consulted.     Patient was admitted with plans for IV fluids, pain management, n.p.o. status, stool culture, C. difficile, to monitor electrolytes and white blood cell count, attempted NG tube placement and general surgery consultation.    Hospital Course:   Patient did not tolerate NG tube and this was declined on the morning of the seventh.  General surgery evaluated and noted that CT scan although read a small bowel obstruction did not seem to be clinically consistent with his picture including diarrhea and also noted sick family members at home and suspected a infectious diarrhea and felt there was no small bowel obstruction present and no surgical intervention needed at this time.  When seen by the medicine service later on hospital day 1 the patient reported no further nausea vomiting or diarrhea and he was eager to eat.  Abdominal pain improved.  He was placed on a clear liquid diet with plans to advance as tolerated.  C. difficile  assay was negative.  He has been able tolerate advancement to a full liquid diet without further symptoms of nausea vomiting or diarrhea or abdominal pain.  He otherwise reports feeling well.  Leukocytosis is resolved.  Electrolyte abnormalities have improved.  He otherwise reports feeling well and is plan for discharge today    Consultants:   Dr. Jonathon Jordan of General S[facility].    Imaging:  Ct Abd Pelv W Cont    Result Date: 04/12/2018  DICOM format image data is available to nonaffiliated external healthcare facilities or entities on a secure, media free, reciprocally searchable basis with patient authorization for 12 months following the date of the study. TECHNIQUE: CT of the abdomen and pelvis WITH intravenous contrast.  The abdomen and pelvis were scanned utilizing a multidetector helical scanner from  the diaphragm to the lesser trochanter without the oral administration of barium.  Coronal and sagittal reformations were obtained. COMPARISON: none INDICATION: Upper abdominal pain and vomiting DISCUSSION: LOWER THORAX: Dependent atelectasis bilateral lower lobes. HEPATOBILIARY: Cholecystectomy. SPLEEN: No splenomegaly or focal lesion. PANCREAS: No focal masses or ductal dilatation. ADRENALS: No adrenal nodules. KIDNEYS/URETERS: No hydronephrosis, stones, or solid mass lesions. PELVIC ORGANS/BLADDER: Unremarkable. PERITONEUM / RETROPERITONEUM: No free air or fluid. LYMPH NODES: No lymphadenopathy. VESSELS: Unremarkable. GI TRACT: Dilated jejunal and proximal ileal bowel loops up to 3.9 cm diameter with nondilated distal ileal bowel loops. Transition point not clear. Some swirling of mesenteric vessels noted centrally. No pneumatosis or significant bowel wall thickening. Normal bowel wall enhancement noted. Colon nondistended. BONES AND SOFT TISSUES: Unremarkable.     IMPRESSION: High-grade small bowel obstruction. Internal hernia, closed loop obstruction not completely excluded apparent swirling of mesenteric vessels although no clear evidence of bowel ischemia seen at this time. Cholecystectomy.      Physical Exam on Discharge:  Visit Vitals  BP 109/62 (BP 1 Location: Right arm, BP Patient Position: Supine)   Pulse 75   Temp 97.9 ??F (36.6 ??C)   Resp 16   Ht 6' (1.829 m)   Wt 116.5 kg (256 lb 13.4 oz)   SpO2 100%   BMI 34.83 kg/m??     General: Alert, Obese CM, pleasant to conversation, in no acute distress  CV: Regular rate and rhythm, no murmurs, rubs, gallops  Pulm: Lungs clear to auscultation bilaterally, no focal wheezes, crackles, rhonchi  GI: Soft, nontender, obese/nondistended, mildly hypoactive bowel sounds  Extremity: No clubbing, cyanosis, no pitting lower leg edema, capillary refill time intact to distal fingertips  Skin: Warm, dry    Most Recent Labs:      Recent Results (from the past 24 hour(s))   CBC  WITH AUTOMATED DIFF    Collection Time: 04/13/18  7:44 AM   Result Value Ref Range    WBC 8.4 4.0 - 11.0 1000/mm3    RBC 5.09 3.80 - 5.70 M/uL    HGB 15.0 12.4 - 17.2 gm/dl    HCT 43.3 37.0 - 50.0 %    MCV 85.1 80.0 - 98.0 fL    MCH 29.5 23.0 - 34.6 pg    MCHC 34.6 30.0 - 36.0 gm/dl    PLATELET 216 140 - 450 1000/mm3    MPV 12.3 (H) 6.0 - 10.0 fL    RDW-SD 39.0 35.1 - 43.9      NRBC 0 0 - 0      IMMATURE GRANULOCYTES 0.4 0.0 - 3.0 %    NEUTROPHILS 57.2 34 - 64 %    LYMPHOCYTES 29.2 28 -  48 %    MONOCYTES 12.1 1 - 13 %    EOSINOPHILS 0.7 0 - 5 %    BASOPHILS 0.4 0 - 3 %   METABOLIC PANEL, COMPREHENSIVE    Collection Time: 04/13/18  7:44 AM   Result Value Ref Range    Sodium 137 136 - 145 mEq/L    Potassium 3.8 3.5 - 5.1 mEq/L    Chloride 104 98 - 107 mEq/L    CO2 28 21 - 32 mEq/L    Glucose 89 74 - 106 mg/dl    BUN 9 7 - 25 mg/dl    Creatinine 0.8 0.6 - 1.3 mg/dl    GFR est AA >60      GFR est non-AA >60      Calcium 8.4 (L) 8.5 - 10.1 mg/dl    AST (SGOT) 52 (H) 15 - 37 U/L    ALT (SGPT) 38 12 - 78 U/L    Alk. phosphatase 60 45 - 117 U/L    Bilirubin, total 0.3 0.2 - 1.0 mg/dl    Protein, total 6.2 (L) 6.4 - 8.2 gm/dl    Albumin 2.8 (L) 3.4 - 5.0 gm/dl    Anion gap 6 5 - 15 mmol/L         Condition at discharge: Stable.     Disposition:  Home      PCP:  [facility], Not On File, f/up with either PCP, or if no PCP then Transitional care clinic in 1-2 weeks.      Total time for DC was 28 minutes.      Marzella Schlein, MD  April 13, 2018  10:15

## 2018-04-13 NOTE — Progress Notes (Signed)
Discharge Date: 04/13/2018    Discharge Location: Home    Discharge Needs: Good RX card for Phenergan RX , TCC referral     Transportation: Friend will transport home     Communication with: Patient, RN, MD

## 2018-04-13 NOTE — Progress Notes (Signed)
Patient is not in room to receive discharge paperwork.  Patient left without them.

## 2018-04-14 ENCOUNTER — Encounter (HOSPITAL_COMMUNITY): Payer: Self-pay | Admitting: *Deleted

## 2018-04-14 ENCOUNTER — Other Ambulatory Visit: Payer: Self-pay

## 2018-04-14 ENCOUNTER — Inpatient Hospital Stay (HOSPITAL_COMMUNITY)
Admission: EM | Admit: 2018-04-14 | Discharge: 2018-04-15 | DRG: 392 | Disposition: A | Discharge status: Home / Self Care | Payer: Self-pay | Attending: Oncology | Admitting: Oncology

## 2018-04-14 ENCOUNTER — Emergency Department (HOSPITAL_COMMUNITY): Payer: Self-pay

## 2018-04-14 DIAGNOSIS — T502X5A Adverse effect of carbonic-anhydrase inhibitors, benzothiadiazides and other diuretics, initial encounter: Secondary | ICD-10-CM | POA: Diagnosis present

## 2018-04-14 DIAGNOSIS — E876 Hypokalemia: Secondary | ICD-10-CM | POA: Diagnosis present

## 2018-04-14 DIAGNOSIS — Z8249 Family history of ischemic heart disease and other diseases of the circulatory system: Secondary | ICD-10-CM

## 2018-04-14 DIAGNOSIS — Z881 Allergy status to other antibiotic agents status: Secondary | ICD-10-CM

## 2018-04-14 DIAGNOSIS — R740 Nonspecific elevation of levels of transaminase and lactic acid dehydrogenase [LDH]: Secondary | ICD-10-CM | POA: Diagnosis present

## 2018-04-14 DIAGNOSIS — I10 Essential (primary) hypertension: Secondary | ICD-10-CM

## 2018-04-14 DIAGNOSIS — Z825 Family history of asthma and other chronic lower respiratory diseases: Secondary | ICD-10-CM

## 2018-04-14 DIAGNOSIS — Z9049 Acquired absence of other specified parts of digestive tract: Secondary | ICD-10-CM

## 2018-04-14 DIAGNOSIS — L906 Striae atrophicae: Secondary | ICD-10-CM

## 2018-04-14 DIAGNOSIS — A0839 Other viral enteritis: Principal | ICD-10-CM | POA: Diagnosis present

## 2018-04-14 DIAGNOSIS — K56609 Unspecified intestinal obstruction, unspecified as to partial versus complete obstruction: Secondary | ICD-10-CM

## 2018-04-14 DIAGNOSIS — J45909 Unspecified asthma, uncomplicated: Secondary | ICD-10-CM | POA: Diagnosis present

## 2018-04-14 DIAGNOSIS — Z6834 Body mass index (BMI) 34.0-34.9, adult: Secondary | ICD-10-CM

## 2018-04-14 DIAGNOSIS — K56600 Partial intestinal obstruction, unspecified as to cause: Secondary | ICD-10-CM | POA: Diagnosis present

## 2018-04-14 DIAGNOSIS — R109 Unspecified abdominal pain: Secondary | ICD-10-CM

## 2018-04-14 DIAGNOSIS — K529 Noninfective gastroenteritis and colitis, unspecified: Secondary | ICD-10-CM | POA: Diagnosis present

## 2018-04-14 DIAGNOSIS — Z79899 Other long term (current) drug therapy: Secondary | ICD-10-CM

## 2018-04-14 DIAGNOSIS — Z882 Allergy status to sulfonamides status: Secondary | ICD-10-CM

## 2018-04-14 DIAGNOSIS — Z8661 Personal history of infections of the central nervous system: Secondary | ICD-10-CM

## 2018-04-14 DIAGNOSIS — Z9089 Acquired absence of other organs: Secondary | ICD-10-CM

## 2018-04-14 DIAGNOSIS — Z833 Family history of diabetes mellitus: Secondary | ICD-10-CM

## 2018-04-14 DIAGNOSIS — I159 Secondary hypertension, unspecified: Secondary | ICD-10-CM | POA: Diagnosis present

## 2018-04-14 DIAGNOSIS — E669 Obesity, unspecified: Secondary | ICD-10-CM | POA: Diagnosis present

## 2018-04-14 LAB — COMPREHENSIVE METABOLIC PANEL
ALT: 44 U/L (ref 0–44)
AST: 62 U/L — ABNORMAL HIGH (ref 15–41)
Albumin: 3.7 g/dL (ref 3.5–5.0)
Alkaline Phosphatase: 62 U/L (ref 38–126)
Anion gap: 9 (ref 5–15)
BUN: <5 mg/dL — ABNORMAL LOW (ref 6–20)
CO2: 29 mmol/L (ref 22–32)
Calcium: 9.1 mg/dL (ref 8.9–10.3)
Chloride: 98 mmol/L (ref 98–111)
Creatinine, Ser: 0.88 mg/dL (ref 0.61–1.24)
GFR calc Af Amer: >60 mL/min (ref >60)
GFR calc non Af Amer: >60 mL/min (ref >60)
Glucose, Bld: 95 mg/dL (ref 70–99)
Potassium: 3 mmol/L — ABNORMAL LOW (ref 3.5–5.1)
Sodium: 136 mmol/L (ref 135–145)
Total Bilirubin: 0.5 mg/dL (ref 0.3–1.2)
Total Protein: 6.9 g/dL (ref 6.5–8.1)

## 2018-04-14 LAB — CBC
HCT: 45.9 % (ref 39.0–52.0)
Hemoglobin: 16 g/dL (ref 13.0–17.0)
MCH: 29.7 pg (ref 26.0–34.0)
MCHC: 34.9 g/dL (ref 30.0–36.0)
MCV: 85.3 fL (ref 80.0–100.0)
Platelets: 272 10*3/uL (ref 150–400)
RBC: 5.38 MIL/uL (ref 4.22–5.81)
RDW: 12.2 % (ref 11.5–15.5)
WBC: 10.9 10*3/uL — ABNORMAL HIGH (ref 4.0–10.5)
nRBC: 0 % (ref 0.0–0.2)

## 2018-04-14 LAB — URINALYSIS, ROUTINE W REFLEX MICROSCOPIC
Bilirubin Urine: NEGATIVE
Glucose, UA: NEGATIVE mg/dL
Hgb urine dipstick: NEGATIVE
Ketones, ur: NEGATIVE mg/dL
Leukocytes,Ua: NEGATIVE
Nitrite: NEGATIVE
Protein, ur: NEGATIVE mg/dL
Specific Gravity, Urine: 1.006 (ref 1.005–1.030)
pH: 6 (ref 5.0–8.0)

## 2018-04-14 LAB — LIPASE, BLOOD: Lipase: 23 U/L (ref 11–51)

## 2018-04-14 MED ORDER — ONDANSETRON HCL 4 MG/2ML IJ SOLN
4.00 | INTRAMUSCULAR | Status: DC
Start: ? — End: 2018-04-14

## 2018-04-14 MED ORDER — ONDANSETRON HCL 4 MG/2ML IJ SOLN
4.0000 mg | Freq: Once | INTRAMUSCULAR | Status: AC
  Administered 2018-04-14: 4 mg via INTRAVENOUS
  Filled 2018-04-14: qty 2

## 2018-04-14 MED ORDER — ACETAMINOPHEN 325 MG PO TABS
650.00 | ORAL_TABLET | ORAL | Status: DC
Start: ? — End: 2018-04-14

## 2018-04-14 MED ORDER — MORPHINE SULFATE (PF) 4 MG/ML IV SOLN
4.0000 mg | Freq: Once | INTRAVENOUS | Status: AC
  Administered 2018-04-14: 4 mg via INTRAVENOUS
  Filled 2018-04-14: qty 1

## 2018-04-14 MED ORDER — SODIUM CHLORIDE FLUSH 0.9 % IV SOLN
5.00 | INTRAVENOUS | Status: DC
Start: ? — End: 2018-04-14

## 2018-04-14 MED ORDER — GENERIC EXTERNAL MEDICATION
12.50 | Status: DC
Start: ? — End: 2018-04-14

## 2018-04-14 MED ORDER — SODIUM CHLORIDE FLUSH 0.9 % IV SOLN
5.00 | INTRAVENOUS | Status: DC
Start: 2018-04-13 — End: 2018-04-14

## 2018-04-14 MED ORDER — MORPHINE SULFATE 2 MG/ML IJ SOLN
2.00 | INTRAMUSCULAR | Status: DC
Start: ? — End: 2018-04-14

## 2018-04-14 MED ORDER — POTASSIUM CHLORIDE 10 MEQ/100ML IV SOLN
10.0000 meq | INTRAVENOUS | Status: AC
  Administered 2018-04-14 (×3): 10 meq via INTRAVENOUS
  Filled 2018-04-14 (×3): qty 100

## 2018-04-14 MED ORDER — NALOXONE HCL 0.4 MG/ML IJ SOLN
0.10 | INTRAMUSCULAR | Status: DC
Start: ? — End: 2018-04-14

## 2018-04-14 MED ORDER — HYDROCHLOROTHIAZIDE 25 MG PO TABS
25.00 | ORAL_TABLET | ORAL | Status: DC
Start: 2018-04-14 — End: 2018-04-14

## 2018-04-14 MED ORDER — SODIUM CHLORIDE 0.9% FLUSH: 3.0000 mL | Freq: Once | INTRAVENOUS | Status: DC

## 2018-04-14 MED ORDER — SODIUM CHLORIDE 0.9 % IV SOLN
INTRAVENOUS | Status: DC
  Administered 2018-04-14: 21:00:00 via INTRAVENOUS
  Filled 2018-04-14 (×3): qty 1000

## 2018-04-14 MED ORDER — SODIUM CHLORIDE 0.9 % IV BOLUS
1000.0000 mL | Freq: Once | INTRAVENOUS | Status: AC
  Administered 2018-04-14: 1000 mL via INTRAVENOUS

## 2018-04-14 MED ORDER — SODIUM CHLORIDE 0.9 % IV SOLN: INTRAVENOUS | Status: DC

## 2018-04-14 MED ORDER — ENOXAPARIN SODIUM 40 MG/0.4ML ~~LOC~~ SOLN
40.0000 mg | SUBCUTANEOUS | Status: DC
  Administered 2018-04-15: 40 mg via SUBCUTANEOUS
  Filled 2018-04-14: qty 0.4

## 2018-04-14 MED ORDER — IOHEXOL 300 MG/ML  SOLN
100.0000 mL | Freq: Once | INTRAMUSCULAR | Status: AC | PRN
  Administered 2018-04-14: 100 mL via INTRAVENOUS

## 2018-04-14 MED ORDER — MORPHINE SULFATE (PF) 2 MG/ML IV SOLN: 2.0000 mg | INTRAVENOUS | Status: DC | PRN

## 2018-04-14 MED ORDER — POTASSIUM CHLORIDE 10 MEQ/100ML IV SOLN
10.0000 meq | Freq: Once | INTRAVENOUS | Status: AC
  Administered 2018-04-14: 10 meq via INTRAVENOUS
  Filled 2018-04-14: qty 100

## 2018-04-14 MED ORDER — POTASSIUM CHLORIDE IN NACL 20-0.9 MEQ/L-% IV SOLN
INTRAVENOUS | Status: DC
Start: ? — End: 2018-04-14

## 2018-04-14 NOTE — ED Triage Notes (Signed)
Pt in c/o severe upper abdominal pain, seen at [facility] in [place] and was told he had a bowel blockage, but because he was still able to pass stool they discharged him yesterday, pain worse this morning

## 2018-04-14 NOTE — ED Notes (Signed)
ED TO INPATIENT HANDOFF REPORT  ED Nurse Name and Phone #: Stanford Breed [phone]  S Name/Age/Gender Logan Dawson 23 y.o. male Room/Bed: 013C/013C  Code Status   Code Status: Full Code  Home/SNF/Other Home Patient oriented to: self Is this baseline? Yes   Triage Complete: Triage complete  Chief Complaint bruising/upper abd pain  Triage Note Pt in c/o severe upper abdominal pain, seen at [facility] in [place] and was told he had a bowel blockage, but because he was still able to pass stool they discharged him yesterday, pain worse this morning   Allergies Allergies  Allergen Reactions  . Sulfa Antibiotics Hives    Level of Care/Admitting Diagnosis ED Disposition    ED Disposition Condition Comment   Admit  Hospital Area: [facility] [[id number]]  Level of Care: Med-Surg [16]  Diagnosis: Gastroenteritis [[id number]]  Admitting Physician: Levert Feinstein [[id number]]  Attending Physician: Levert Feinstein [[id number]]  Estimated length of stay: past midnight tomorrow  Certification:: I certify this patient will need inpatient services for at least 2 midnights  PT Class (Do Not Modify): Inpatient [101]  PT Acc Code (Do Not Modify): Private [1]       B Medical/Surgery History Past Medical History:  Diagnosis Date  . ADHD (attention deficit hyperactivity disorder)   . Asthma   . Hypertension   . Left shoulder pain   . Meningitis    Past Surgical History:  Procedure Laterality Date  . APPENDECTOMY    . CHOLECYSTECTOMY    . HERNIA REPAIR       A IV Location/Drains/Wounds Patient Lines/Drains/Airways Status   Active Line/Drains/Airways    Name:   Placement date:   Placement time:   Site:   Days:   Peripheral IV 04/14/18 Right Antecubital   04/14/18    1421    Antecubital   less than 1          Intake/Output Last 24 hours  Intake/Output Summary (Last 24 hours) at 04/14/2018 1755 Last data filed at 04/14/2018 1708 Gross per 24 hour  Intake 1100 ml   Output -  Net 1100 ml    Labs/Imaging Results for orders placed or performed during the hospital encounter of 04/14/18 (from the past 48 hour(s))  Urinalysis, Routine w reflex microscopic     Status: Abnormal   Collection Time: 04/14/18 10:03 AM  Result Value Ref Range   Color, Urine STRAW (A) YELLOW   APPearance CLEAR CLEAR   Specific Gravity, Urine 1.006 1.005 - 1.030   pH 6.0 5.0 - 8.0   Glucose, UA NEGATIVE NEGATIVE mg/dL   Hgb urine dipstick NEGATIVE NEGATIVE   Bilirubin Urine NEGATIVE NEGATIVE   Ketones, ur NEGATIVE NEGATIVE mg/dL   Protein, ur NEGATIVE NEGATIVE mg/dL   Nitrite NEGATIVE NEGATIVE   Leukocytes,Ua NEGATIVE NEGATIVE    Comment: Performed at [facility], [address] [place], [place] [address]  Lipase, blood     Status: None   Collection Time: 04/14/18 11:25 AM  Result Value Ref Range   Lipase 23 11 - 51 U/L    Comment: Performed at [facility], [address], [place], [place] [address]  Comprehensive metabolic panel     Status: Abnormal   Collection Time: 04/14/18 11:25 AM  Result Value Ref Range   Sodium 136 135 - 145 mmol/L   Potassium 3.0 (L) 3.5 - 5.1 mmol/L   Chloride 98 98 - 111 mmol/L   CO2 29 22 - 32  mmol/L   Glucose, Bld 95 70 - 99 mg/dL   BUN <5 (L) 6 - 20 mg/dL   Creatinine, Ser 9.600.88 0.61 - 1.24 mg/dL   Calcium 9.1 8.9 - 45.410.3 mg/dL   Total Protein 6.9 6.5 - 8.1 g/dL   Albumin 3.7 3.5 - 5.0 g/dL   AST 62 (H) 15 - 41 U/L   ALT 44 0 - 44 U/L   Alkaline Phosphatase 62 38 - 126 U/L   Total Bilirubin 0.5 0.3 - 1.2 mg/dL   GFR calc non Af Amer >60 >60 mL/min   GFR calc Af Amer >60 >60 mL/min   Anion gap 9 5 - 15    Comment: Performed at [facility], [address] [place], [place] [address]  CBC     Status: Abnormal   Collection Time: 04/14/18 11:25 AM  Result Value Ref Range   WBC 10.9 (H) 4.0 - 10.5 K/uL   RBC 5.38 4.22 - 5.81 MIL/uL   Hemoglobin 16.0 13.0 - 17.0 g/dL   HCT 91.445.9 78.239.0 - 95.652.0 %   MCV 85.3 80.0 -  100.0 fL   MCH 29.7 26.0 - 34.0 pg   MCHC 34.9 30.0 - 36.0 g/dL   RDW 21.312.2 08.611.5 - 57.815.5 %   Platelets 272 150 - 400 K/uL   nRBC 0.0 0.0 - 0.2 %    Comment: Performed at [facility], [address] [place], [place] [address]   Ct Abdomen Pelvis W Contrast  Result Date: 04/14/2018 CLINICAL DATA:  Abdominal distension. EXAM: CT ABDOMEN AND PELVIS WITH CONTRAST TECHNIQUE: Multidetector CT imaging of the abdomen and pelvis was performed using the standard protocol following bolus administration of intravenous contrast. CONTRAST:  100mL OMNIPAQUE IOHEXOL 300 MG/ML  SOLN COMPARISON:  06/15/2016 FINDINGS: Lower chest: No acute abnormality. Hepatobiliary: No focal liver abnormality is seen. Status post cholecystectomy. No biliary dilatation. Pancreas: Unremarkable. No pancreatic ductal dilatation or surrounding inflammatory changes. Spleen: Normal in size without focal abnormality. Adrenals/Urinary Tract: Adrenal glands are unremarkable. Kidneys are normal, without renal calculi, focal lesion, or hydronephrosis. Bladder is unremarkable. Stomach/Bowel: Normal appearance of the stomach and colon. Post appendectomy. Several abnormally dilated small bowel loops in the central abdomen with gradual transition to normal caliber in the left central abdomen. There is a short segment of circumferential mucosal thickening of the small bowel just distal to the area of dilation, likely causing the upstream dilation. Vascular/Lymphatic: No significant vascular findings are present. No enlarged abdominal or pelvic lymph nodes. Scattered sub pathologic central mesenteric lymph nodes. Reproductive: Prostate is unremarkable. Other: No abdominal wall hernia or abnormality. No abdominopelvic ascites. Musculoskeletal: No acute or significant osseous findings. IMPRESSION: Early or incomplete small bowel obstruction within the mid abdomen. Short segment of circumferential mucosal thickening of the small bowel just distal to the  area of dilation may be causing the upstream obstruction. Alternatively it may be due to adhesions. Closed loop obstruction can not be entirely excluded. Electronically Signed   By: Ted Mcalpineobrinka  Dimitrova M.D.   On: 04/14/2018 16:14    Pending Labs Unresulted Labs (From admission, onward)    Start     Ordered   04/15/18 0500  Comprehensive metabolic panel  Tomorrow morning,   R     04/14/18 1729   04/15/18 0500  CBC  Tomorrow morning,   R     04/14/18 1729   04/14/18 1727  HIV antibody (Routine Testing)  Once,   R     04/14/18  1729          Vitals/Pain Today's Vitals   04/14/18 1404 04/14/18 1429 04/14/18 1652 04/14/18 1753  BP:  126/80    Pulse:  92    Resp:  18    Temp:      TempSrc:      SpO2:  98%    PainSc: 2   10-Worst pain ever 0-No pain    Isolation Precautions No active isolations  Medications Medications  sodium chloride flush (NS) 0.9 % injection 3 mL (has no administration in time range)  enoxaparin (LOVENOX) injection 40 mg (has no administration in time range)  potassium chloride 10 mEq in 100 mL IVPB (has no administration in time range)  morphine 2 MG/ML injection 2 mg (has no administration in time range)  sodium chloride 0.9 % 1,000 mL with potassium chloride 30 mEq infusion (has no administration in time range)  sodium chloride 0.9 % bolus 1,000 mL (0 mLs Intravenous Stopped 04/14/18 1708)  ondansetron (ZOFRAN) injection 4 mg (4 mg Intravenous Given 04/14/18 1422)  morphine 4 MG/ML injection 4 mg (4 mg Intravenous Given 04/14/18 1423)  potassium chloride 10 mEq in 100 mL IVPB (0 mEq Intravenous Stopped 04/14/18 1528)  iohexol (OMNIPAQUE) 300 MG/ML solution 100 mL (100 mLs Intravenous Contrast Given 04/14/18 1526)    Mobility walks Low fall risk   Focused Assessments GI   R Recommendations: See Admitting Provider Note  Report given to:   Additional Notes: Dx: SBO.  He has no pain at this time.

## 2018-04-14 NOTE — ED Notes (Signed)
Patient transported to CT 

## 2018-04-14 NOTE — Consult Note (Signed)
[facility] Surgery Consult Note  Logan Dawson 12-12-1995  [id number].    Requesting MD: Dr. Silverio Lay  Chief Complaint/Reason for Consult: SBO  HPI:   Pt is a 23 yo male with a hx of HTN, asthma, cholecystectomy, appendectomy, umbilical hernia repair who presented to the ED with complaints of abdominal pain and diarrhea. Patient went to [facility] in [place] 3 days ago for abdominal pain, diarrhea and vomiting.  He was diagnosed with gastroenteritis and had a CT that showed a possible small bowel obstruction. He was seen by surgery there who clinically did not think he was obstructed. He tolerated PO and was sent home. He woke today with diarrhea, abdominal bloating, and worsening abdominal cramps. Pain is in his upper abdomen.  CT today showed early or incomplete SBO within the mid abdomen. Short segment of circumferential mucosal thickening of the small bowel just distal to the area of dilation may be causing the upstream obstruction, closed loop obstruction can not be entirely excluded. Labs: K 3.0, AST 62, WBC 10.9, all other labs unremarkable  General surgery asked to see.  ROS:  Review of Systems  Constitutional: Negative for chills and fever.  HENT: Negative.   Eyes: Negative.   Respiratory: Negative.   Cardiovascular: Negative.   Gastrointestinal: Positive for abdominal pain and diarrhea. Negative for nausea and vomiting.  Genitourinary: Negative.   Musculoskeletal: Negative.   Skin: Negative.   Neurological: Negative.      Family History  Problem Relation Age of Onset  . Diabetes Mother   . COPD Mother   . Diabetes Father   . Hypertension Father   . Heart disease Father     Past Medical History:  Diagnosis Date  . ADHD (attention deficit hyperactivity disorder)   . Asthma   . Hypertension   . Left shoulder pain   . Meningitis     Past Surgical History:  Procedure Laterality Date  . APPENDECTOMY    . CHOLECYSTECTOMY    . HERNIA REPAIR       Social History:  reports that he has never smoked. He has never used smokeless tobacco. He reports current alcohol use. He reports that he does not use drugs.  Allergies:  Allergies  Allergen Reactions  . Sulfa Antibiotics Hives    (Not in a hospital admission)   Blood pressure 126/80, pulse 92, temperature 98.5 F (36.9 C), temperature source Oral, resp. rate 18, SpO2 98 %.  Physical Exam Vitals signs and nursing note reviewed.  Constitutional:      General: He is not in acute distress.    Appearance: He is well-developed. He is not ill-appearing.  HENT:     Head: Normocephalic and atraumatic.     Mouth/Throat:     Pharynx: Oropharynx is clear.  Eyes:     General: No scleral icterus.    Extraocular Movements: Extraocular movements intact.     Pupils: Pupils are equal, round, and reactive to light.  Cardiovascular:     Rate and Rhythm: Normal rate and regular rhythm.     Heart sounds: Normal heart sounds.  Pulmonary:     Effort: Pulmonary effort is normal. No respiratory distress.     Breath sounds: Normal breath sounds. No wheezing or rhonchi.  Abdominal:     General: There is distension.     Hernia: No hernia is present.     Comments: Mild epigastric TTP without rebound or guarding, no peritonitis. Few BS heard  Skin:    General: Skin  is warm and dry.  Neurological:     General: No focal deficit present.     Mental Status: He is alert and oriented to person, place, and time.     Cranial Nerves: No cranial nerve deficit.  Psychiatric:        Mood and Affect: Mood normal.        Behavior: Behavior normal.     Results for orders placed or performed during the hospital encounter of 04/14/18 (from the past 48 hour(s))  Urinalysis, Routine w reflex microscopic     Status: Abnormal   Collection Time: 04/14/18 10:03 AM  Result Value Ref Range   Color, Urine STRAW (A) YELLOW   APPearance CLEAR CLEAR   Specific Gravity, Urine 1.006 1.005 - 1.030   pH 6.0 5.0 -  8.0   Glucose, UA NEGATIVE NEGATIVE mg/dL   Hgb urine dipstick NEGATIVE NEGATIVE   Bilirubin Urine NEGATIVE NEGATIVE   Ketones, ur NEGATIVE NEGATIVE mg/dL   Protein, ur NEGATIVE NEGATIVE mg/dL   Nitrite NEGATIVE NEGATIVE   Leukocytes,Ua NEGATIVE NEGATIVE    Comment: Performed at [facility], [address] [place], [place] [address]  Lipase, blood     Status: None   Collection Time: 04/14/18 11:25 AM  Result Value Ref Range   Lipase 23 11 - 51 U/L    Comment: Performed at [facility], [address] [place], [place] [address]  Comprehensive metabolic panel     Status: Abnormal   Collection Time: 04/14/18 11:25 AM  Result Value Ref Range   Sodium 136 135 - 145 mmol/L   Potassium 3.0 (L) 3.5 - 5.1 mmol/L   Chloride 98 98 - 111 mmol/L   CO2 29 22 - 32 mmol/L   Glucose, Bld 95 70 - 99 mg/dL   BUN <5 (L) 6 - 20 mg/dL   Creatinine, Ser 0.980.88 0.61 - 1.24 mg/dL   Calcium 9.1 8.9 - 11.910.3 mg/dL   Total Protein 6.9 6.5 - 8.1 g/dL   Albumin 3.7 3.5 - 5.0 g/dL   AST 62 (H) 15 - 41 U/L   ALT 44 0 - 44 U/L   Alkaline Phosphatase 62 38 - 126 U/L   Total Bilirubin 0.5 0.3 - 1.2 mg/dL   GFR calc non Af Amer >60 >60 mL/min   GFR calc Af Amer >60 >60 mL/min   Anion gap 9 5 - 15    Comment: Performed at [facility], [address] [place], [place] [address]  CBC     Status: Abnormal   Collection Time: 04/14/18 11:25 AM  Result Value Ref Range   WBC 10.9 (H) 4.0 - 10.5 K/uL   RBC 5.38 4.22 - 5.81 MIL/uL   Hemoglobin 16.0 13.0 - 17.0 g/dL   HCT 95.645.9 21.339.0 - 08.652.0 %   MCV 85.3 80.0 - 100.0 fL   MCH 29.7 26.0 - 34.0 pg   MCHC 34.9 30.0 - 36.0 g/dL   RDW 57.812.2 46.911.5 - 62.915.5 %   Platelets 272 150 - 400 K/uL   nRBC 0.0 0.0 - 0.2 %    Comment: Performed at [facility], [address] [place], [place] [address]   Ct Abdomen Pelvis W Contrast  Result Date: 04/14/2018 CLINICAL DATA:  Abdominal distension. EXAM: CT ABDOMEN AND PELVIS WITH CONTRAST TECHNIQUE: Multidetector  CT imaging of the abdomen and pelvis was performed using the standard protocol following bolus administration of intravenous contrast. CONTRAST:  100mL OMNIPAQUE IOHEXOL  300 MG/ML  SOLN COMPARISON:  06/15/2016 FINDINGS: Lower chest: No acute abnormality. Hepatobiliary: No focal liver abnormality is seen. Status post cholecystectomy. No biliary dilatation. Pancreas: Unremarkable. No pancreatic ductal dilatation or surrounding inflammatory changes. Spleen: Normal in size without focal abnormality. Adrenals/Urinary Tract: Adrenal glands are unremarkable. Kidneys are normal, without renal calculi, focal lesion, or hydronephrosis. Bladder is unremarkable. Stomach/Bowel: Normal appearance of the stomach and colon. Post appendectomy. Several abnormally dilated small bowel loops in the central abdomen with gradual transition to normal caliber in the left central abdomen. There is a short segment of circumferential mucosal thickening of the small bowel just distal to the area of dilation, likely causing the upstream dilation. Vascular/Lymphatic: No significant vascular findings are present. No enlarged abdominal or pelvic lymph nodes. Scattered sub pathologic central mesenteric lymph nodes. Reproductive: Prostate is unremarkable. Other: No abdominal wall hernia or abnormality. No abdominopelvic ascites. Musculoskeletal: No acute or significant osseous findings. IMPRESSION: Early or incomplete small bowel obstruction within the mid abdomen. Short segment of circumferential mucosal thickening of the small bowel just distal to the area of dilation may be causing the upstream obstruction. Alternatively it may be due to adhesions. Closed loop obstruction can not be entirely excluded. Electronically Signed   By: Ted Mcalpine M.D.   On: 04/14/2018 16:14      Assessment/Plan Active Problems:   * No active hospital problems. *  ?SBO - pt is having bowel function and no nausea/vomiting. No peritonitis on exam. This is  more suggestive of enteritis. - recommend medicine admission. Bowel rest/NPO, IVF. Monitor electrolytes and keep K>4 and Mag>2. Repeat abdominal film in the AM.    Franne Forts, [facility] Surgery 04/14/2018, 4:54 PM Pager: [phone] Mon 7:00 am -11:30 AM Tues-Fri 7:00 am-4:30 pm Sat-Sun 7:00 am-11:30 am

## 2018-04-14 NOTE — ED Notes (Signed)
States he was just d/c from [place] yesterday with concerns for a blockage. Today with frequent urination, take diuretic and just feeling sick. Denies vomiting today.

## 2018-04-14 NOTE — ED Notes (Signed)
Pt care assumed, obtained verbal report.  Pt is resting and appears comfortable.  He denies any pain at this time.  Surgery PA was at bedside to assess pt.  Pt ambulated to the BR with steady gait.

## 2018-04-14 NOTE — ED Notes (Signed)
Granfortuna MD at bedside to assess pt

## 2018-04-14 NOTE — H&P (Signed)
Date: 04/14/2018               Patient Name:  Logan Dawson MRN: [id number]  DOB: Feb 08, 1995 Age / Sex: 23 y.o., male   PCP: [facility]         Medical Service: Internal Medicine Teaching Service         Attending Physician: Dr. Cyndie Chime, Genene Churn, MD    First Contact: Dr. Avie Arenas Pager: [phone]  Second Contact: Dr. Crista Elliot Pager: [phone]       After Hours (After 5p/  First Contact Pager: [phone]  weekends / holidays): Second Contact Pager: [phone]   Chief Complaint: abdominal pain   History of Present Illness: Logan Dawson is a 23 yo man with a medical history of HTN, asthma, and multiple abdominal surgeries (open appendectomy, laparoscopic cholecystectomy, and open umbilical hernia repair) who presented to the ED for abdominal pain. He reports that he developed nausea and vomiting three days ago. He was admitted to [facility] in [place] two days ago for possible small bowel obstruction. He was discharged yesterday because his nausea and vomiting resolved, he was passing stool, and his appetite was good. This morning, he woke up with severe abdominal pain and abdominal distension. He describes the pain as spasms. The pain worsens when he sits up and moves. He has not tried any medications for the pain at home. He no longer has nausea and vomiting. He has been tolerating normal foods. Eating does not exacerbate his pain. He also developed diarrhea today. He has had 5-6 episodes of loose, watery, brown stools today. He denies hematochezia or melena. His brother was recently ill with nausea and vomiting as well. He denies fevers, chills, rhinorrhea, congestion, dyspnea, dysuria.   Upon arrival to the ED, the patient was afebrile and hemodynamically stable. Labs were notable for mild leukocytosis at 10.9, hypokalemia at 3, negative lipase. UA unremarkable. CT abdomen showed early or incomplete SBO within the mid abdomen. The patient received 1L NS,  zofran, morphine, and potassium. The morphine completely resolved his abdominal pain.   Meds:  Current Meds  Medication Sig  . albuterol (PROVENTIL HFA;VENTOLIN HFA) 108 (90 Base) MCG/ACT inhaler Inhale 2 puffs into the lungs every 6 (six) hours as needed for wheezing or shortness of breath.  . hydrochlorothiazide (HYDRODIURIL) 25 MG tablet Take 25 mg by mouth daily.  . Omega-3 1000 MG CAPS Take 1,000 mg by mouth daily.     Allergies: Allergies as of 04/14/2018 - Review Complete 04/14/2018  Allergen Reaction Noted  . Sulfa antibiotics Hives 10/30/2010   Past Medical History:  Diagnosis Date  . ADHD (attention deficit hyperactivity disorder)   . Asthma   . Hypertension   . Left shoulder pain   . Meningitis    Past Surgical History:  Procedure Laterality Date  . APPENDECTOMY    . CHOLECYSTECTOMY    . HERNIA REPAIR      Family History:  Family History  Problem Relation Age of Onset  . Diabetes Mother   . COPD Mother   . Diabetes Father   . Hypertension Father   . Heart disease Father    Social History: Lives at home with parents. Current job involves a lot of travel. Never smoker. Infrequent etoh use. Denies illicit drug use.  Review of Systems: A complete ROS was negative except as per HPI.   Physical Exam: Blood pressure 128/81, pulse 97, temperature 98.6 F (37 C), temperature source Oral, resp. rate  16, SpO2 97 %.  Constitutional: Obese young man in no distress. Eyes: Pupils are equal, round, and reactive to light. EOM are normal.  Cardiovascular: Normal rate and regular rhythm. No murmurs, rubs, or gallops. Pulmonary/Chest: Effort normal. Clear to auscultation bilaterally. No wheezes, rales, or rhonchi. Abdominal: Diffuse abdominal striae. Hypoactive bowel sounds. Soft, non-distended. Tender in the LLQ and LUQ. No rebound tenderness.  Ext: No lower extremity edema. Skin: Warm and dry. No rashes or wounds.  Assessment & Plan by Problem: Active Problems:    Gastroenteritis  Logan Dawson is a 23 yo man with a medical history of HTN, asthma, and multiple abdominal surgeries (open appendectomy, laparoscopic cholecystectomy, and open umbilical hernia repair) who presented with abdominal pain, abdominal spasms, and diarrhea after a recent admission for nausea, vomiting, and rule out SBO. He was admitted for further evaluation and management.  Gastroenteritis  - Likely viral gastroenteritis given sick contact with similar symptoms and benign exam without peritoneal signs. Although the CT abdomen showed possible SBO, the patient is not having nausea and is passing stool. He is tolerating PO.  Plan - General surgery following, appreciate recs. No surgical intervention indicated at this time.  - Pain control: morphine 2mg  q4hrs prn - NS with K at 148ml/hr - KUB tomorrow morning - NPO - CBC and CMP tomorrow morning  HTN - Hold home hctz due to hypokalemia  Hypokalemia - K = 3 - Likely 2/2 hctz and diarrhea.  Plan - Hold hctz  - IV replacement - Trend K   Abdominal striae - Diffuse abdominal striae, obesity, and HTN in a young person. Consider Cushing's syndrome. Plan - am cortisol   FEN: NS with K at 100 ml/hr fluids, NPO, replace electrolytes as needed  DVT ppx: Lovenox Code status: FULL code  Dispo: Admit patient to Observation with expected length of stay less than 2 midnights.  Signed: Dionne Ano, MD 04/14/2018, 6:17 PM  Pager: [phone]

## 2018-04-14 NOTE — ED Notes (Signed)
Pt aware of need for urine sample, cup provided.

## 2018-04-14 NOTE — ED Provider Notes (Signed)
[facility] EMERGENCY DEPARTMENT Provider Note   CSN: [id number] Arrival date & time: 04/14/18  0957    History   Chief Complaint Chief Complaint  Patient presents with  . Abdominal Pain    HPI Logan Dawson is a 23 y.o. male hx of ADHD, HTN, here presenting with abdominal pain, diarrhea.  Patient went to [facility] in [place] 2 days ago for abdominal pain and diarrhea and vomiting.  He was diagnosed with gastroenteritis and had a CT that showed a possible small bowel obstruction.  He was seen by surgery and was thought that he does not clinically have small bowel obstruction.  Patient tolerated p.o. yesterday and was sent home.  He woke up this morning with an episode of diarrhea as well as abdominal cramps.  He states that he feels nauseated but did not vomit.       The history is provided by the patient.    Past Medical History:  Diagnosis Date  . ADHD (attention deficit hyperactivity disorder)   . Asthma   . Hypertension   . Left shoulder pain   . Meningitis     Patient Active Problem List   Diagnosis Date Noted  . Gastroenteritis 04/14/2018  . Black-out (not amnesia) 11/27/2012  . Concussion with < 1 hr loss of consciousness 11/27/2012  . Hearing voices 11/27/2012  . Skull fracture with concussion (HCC) 11/27/2012  . Subarachnoid hemorrhage (HCC) 11/27/2012  . Unable to control anger 11/27/2012  . Abrasions of multiple sites 11/14/2012  . Linear skull fracture (HCC) 11/14/2012  . SAH (subarachnoid hemorrhage) (HCC) 11/14/2012  . Trauma 11/14/2012    Past Surgical History:  Procedure Laterality Date  . APPENDECTOMY    . CHOLECYSTECTOMY    . HERNIA REPAIR          Home Medications    Prior to Admission medications   Medication Sig Start Date End Date Taking? Authorizing Provider  albuterol (PROVENTIL HFA;VENTOLIN HFA) 108 (90 Base) MCG/ACT inhaler Inhale 2 puffs into the lungs every 6 (six) hours as needed for wheezing or  shortness of breath. 07/20/16  Yes Johna Sheriff, MD  hydrochlorothiazide (HYDRODIURIL) 25 MG tablet Take 25 mg by mouth daily.   Yes [provider]  Omega-3 1000 MG CAPS Take 1,000 mg by mouth daily.    Yes [provider]  FLUoxetine (PROZAC) 20 MG tablet Take 1 tablet (20 mg total) by mouth daily. Patient not taking: Reported on 04/14/2018 07/20/16   Johna Sheriff, MD  ibuprofen (ADVIL,MOTRIN) 600 MG tablet Take 1 tablet (600 mg total) by mouth every 6 (six) hours as needed. Patient not taking: Reported on 04/14/2018 07/20/16   Johna Sheriff, MD  metoprolol tartrate (LOPRESSOR) 50 MG tablet Take 1 tablet (50 mg total) by mouth 2 (two) times daily. Patient not taking: Reported on 04/14/2018 07/20/16   Johna Sheriff, MD  rizatriptan (MAXALT) 10 MG tablet Take 1 tablet (10 mg total) by mouth as needed for migraine. May repeat in 2 hours if needed Patient not taking: Reported on 04/14/2018 07/20/16   Johna Sheriff, MD  Spacer/Aero Chamber Mouthpiece MISC 1 each by Does not apply route every 6 (six) hours as needed. 07/20/16   Johna Sheriff, MD    Family History Family History  Problem Relation Age of Onset  . Diabetes Mother   . COPD Mother   . Diabetes Father   . Hypertension Father   . Heart disease Father  Social History Social History   Tobacco Use  . Smoking status: Never Smoker  . Smokeless tobacco: Never Used  Substance Use Topics  . Alcohol use: Yes    Comment: occ.  . Drug use: No     Allergies   Sulfa antibiotics   Review of Systems Review of Systems  Gastrointestinal: Positive for abdominal pain and diarrhea.  All other systems reviewed and are negative.    Physical Exam Updated Vital Signs BP 126/80 (BP Location: Right Arm)   Pulse 92   Temp 98.5 F (36.9 C) (Oral)   Resp 18   SpO2 98%   Physical Exam Vitals signs and nursing note reviewed.  HENT:     Head: Normocephalic.     Mouth/Throat:     Mouth: Mucous membranes  are moist.  Eyes:     Extraocular Movements: Extraocular movements intact.  Cardiovascular:     Rate and Rhythm: Normal rate and regular rhythm.  Pulmonary:     Effort: Pulmonary effort is normal.     Breath sounds: Normal breath sounds.  Abdominal:     General: Abdomen is flat.     Comments: Mild R mid abdominal tenderness, no rebound   Skin:    General: Skin is warm.     Capillary Refill: Capillary refill takes less than 2 seconds.  Neurological:     General: No focal deficit present.     Mental Status: He is alert and oriented to person, place, and time.  Psychiatric:        Mood and Affect: Mood normal.        Behavior: Behavior normal.      ED Treatments / Results  Labs (all labs ordered are listed, but only abnormal results are displayed) Labs Reviewed  COMPREHENSIVE METABOLIC PANEL - Abnormal; Notable for the following components:      Result Value   Potassium 3.0 (*)    BUN <5 (*)    AST 62 (*)    All other components within normal limits  CBC - Abnormal; Notable for the following components:   WBC 10.9 (*)    All other components within normal limits  URINALYSIS, ROUTINE W REFLEX MICROSCOPIC - Abnormal; Notable for the following components:   Color, Urine STRAW (*)    All other components within normal limits  LIPASE, BLOOD  HIV ANTIBODY (ROUTINE TESTING W REFLEX)  COMPREHENSIVE METABOLIC PANEL  CBC    EKG None  Radiology Ct Abdomen Pelvis W Contrast  Result Date: 04/14/2018 CLINICAL DATA:  Abdominal distension. EXAM: CT ABDOMEN AND PELVIS WITH CONTRAST TECHNIQUE: Multidetector CT imaging of the abdomen and pelvis was performed using the standard protocol following bolus administration of intravenous contrast. CONTRAST:  OMNIPAQUE IOHEXOL 300 MG/ML  SOLN COMPARISON:  06/15/2016 FINDINGS: Lower chest: No acute abnormality. Hepatobiliary: No focal liver abnormality is seen. Status post cholecystectomy. No biliary dilatation. Pancreas: Unremarkable. No  pancreatic ductal dilatation or surrounding inflammatory changes. Spleen: Normal in size without focal abnormality. Adrenals/Urinary Tract: Adrenal glands are unremarkable. Kidneys are normal, without renal calculi, focal lesion, or hydronephrosis. Bladder is unremarkable. Stomach/Bowel: Normal appearance of the stomach and colon. Post appendectomy. Several abnormally dilated small bowel loops in the central abdomen with gradual transition to normal caliber in the left central abdomen. There is a short segment of circumferential mucosal thickening of the small bowel just distal to the area of dilation, likely causing the upstream dilation. Vascular/Lymphatic: No significant vascular findings are present. No enlarged abdominal or pelvic  lymph nodes. Scattered sub pathologic central mesenteric lymph nodes. Reproductive: Prostate is unremarkable. Other: No abdominal wall hernia or abnormality. No abdominopelvic ascites. Musculoskeletal: No acute or significant osseous findings. IMPRESSION: Early or incomplete small bowel obstruction within the mid abdomen. Short segment of circumferential mucosal thickening of the small bowel just distal to the area of dilation may be causing the upstream obstruction. Alternatively it may be due to adhesions. Closed loop obstruction can not be entirely excluded. Electronically Signed   By: Ted Mcalpine M.D.   On: 04/14/2018 16:14    Procedures Procedures (including critical care time)  Medications Ordered in ED Medications  sodium chloride flush (NS) 0.9 % injection 3 mL (has no administration in time range)  enoxaparin (LOVENOX) injection 40 mg (has no administration in time range)  potassium chloride 10 mEq in 100 mL IVPB (has no administration in time range)  morphine 2 MG/ML injection 2 mg (has no administration in time range)  sodium chloride 0.9 % 1,000 mL with potassium chloride 30 mEq infusion (has no administration in time range)  sodium chloride 0.9 %  bolus 1,000 mL (0 mLs Intravenous Stopped 04/14/18 1708)  ondansetron (ZOFRAN) injection 4 mg (4 mg Intravenous Given 04/14/18 1422)  morphine 4 MG/ML injection 4 mg (4 mg Intravenous Given 04/14/18 1423)  potassium chloride 10 mEq in 100 mL IVPB (0 mEq Intravenous Stopped 04/14/18 1528)  iohexol (OMNIPAQUE) 300 MG/ML solution 100 mL (100 mLs Intravenous Contrast Given 04/14/18 1526)     Initial Impression / Assessment and Plan / ED Course  I have reviewed the triage vital signs and the nursing notes.  Pertinent labs & imaging results that were available during my care of the patient were reviewed by me and considered in my medical decision making (see chart for details).       KRISTON BOKER is a 23 y.o. male here with abdominal pain, diarrhea. I reviewed records from [facility] in [place]. He had CT that showed SBO with possible internal hernia but was seen by surgery and was thought to have gastroenteritis. He is passing gas and still has diarrhea. I think likely gastroenteritis. Will repeat CT ab/pel, check labs, UA. Will hydrate and reassess.   5 pm CT showed partial SBO. Surgery saw patient and recommend medicine admission, hold off on NG tube for now.  5:46 PM Family practice to admit for gastroenteritis vs partial SBO.   Final Clinical Impressions(s) / ED Diagnoses   Final diagnoses:  Abdominal pain    ED Discharge Orders    None       Charlynne Pander, MD 04/14/18 1746

## 2018-04-15 ENCOUNTER — Inpatient Hospital Stay (HOSPITAL_COMMUNITY): Payer: Self-pay

## 2018-04-15 ENCOUNTER — Other Ambulatory Visit: Payer: Self-pay

## 2018-04-15 LAB — CULTURE, STOOL

## 2018-04-15 LAB — C DIFFICILE QUICK SCREEN W PCR REFLEX
C Diff antigen: NEGATIVE
C Diff interpretation: NOT DETECTED
C Diff toxin: NEGATIVE

## 2018-04-15 LAB — HIV ANTIBODY (ROUTINE TESTING W REFLEX): HIV Screen 4th Generation wRfx: NONREACTIVE

## 2018-04-15 LAB — COMPREHENSIVE METABOLIC PANEL
ALT: 66 U/L — ABNORMAL HIGH (ref 0–44)
AST: 73 U/L — ABNORMAL HIGH (ref 15–41)
Albumin: 3.3 g/dL — ABNORMAL LOW (ref 3.5–5.0)
Alkaline Phosphatase: 72 U/L (ref 38–126)
Anion gap: 10 (ref 5–15)
BUN: 6 mg/dL (ref 6–20)
CO2: 28 mmol/L (ref 22–32)
Calcium: 8.8 mg/dL — ABNORMAL LOW (ref 8.9–10.3)
Chloride: 99 mmol/L (ref 98–111)
Creatinine, Ser: 0.99 mg/dL (ref 0.61–1.24)
GFR calc Af Amer: >60 mL/min (ref >60)
GFR calc non Af Amer: >60 mL/min (ref >60)
Glucose, Bld: 90 mg/dL (ref 70–99)
Potassium: 3.6 mmol/L (ref 3.5–5.1)
Sodium: 137 mmol/L (ref 135–145)
Total Bilirubin: 0.8 mg/dL (ref 0.3–1.2)
Total Protein: 6.5 g/dL (ref 6.5–8.1)

## 2018-04-15 LAB — GASTROINTESTINAL PANEL BY PCR, STOOL (REPLACES STOOL CULTURE)

## 2018-04-15 LAB — CBC
HCT: 46.5 % (ref 39.0–52.0)
Hemoglobin: 16.1 g/dL (ref 13.0–17.0)
MCH: 29.4 pg (ref 26.0–34.0)
MCHC: 34.6 g/dL (ref 30.0–36.0)
MCV: 85 fL (ref 80.0–100.0)
Platelets: 248 10*3/uL (ref 150–400)
RBC: 5.47 MIL/uL (ref 4.22–5.81)
RDW: 11.9 % (ref 11.5–15.5)
WBC: 7.6 10*3/uL (ref 4.0–10.5)
nRBC: 0 % (ref 0.0–0.2)

## 2018-04-15 LAB — CORTISOL-AM, BLOOD: Cortisol - AM: 14.9 ug/dL (ref 6.7–22.6)

## 2018-04-15 NOTE — Progress Notes (Signed)
Patient discharged to home. Patient AVS reviewed and signed. Patient capable re-verbalizing medications and follow-up appointments. IV removed. Patient belongings sent with patient. Patient educated to return to the ED in the event of SOB, chest pain or dizziness.   Wassim Kirksey B. RN 

## 2018-04-15 NOTE — Progress Notes (Signed)
Subjective: CC: Abdominal Pain Reports that he has generalized crampy abdominal pain. No N/V since Saturday. Continues to have watery BM's with last at 630 AM. Continues to pass flatus. Reports that his brother, dad and mother all have had similar symptoms since late last week. He has tolerated FLD breakfast. No recent abx use or travel.   Objective: Vital signs in last 24 hours: Temp:  [98 F (36.7 C)-98.6 F (37 C)] 98.1 F (36.7 C) (03/10 0747) Pulse Rate:  [78-103] 78 (03/10 0747) Resp:  [16-20] 20 (03/10 0747) BP: (111-152)/(66-81) 114/66 (03/10 0747) SpO2:  [96 %-100 %] 98 % (03/10 0747) Weight:  [115.5 kg-116.6 kg] 115.5 kg (03/09 1935) Last BM Date: 04/15/18  Intake/Output from previous day: 03/09 0701 - 03/10 0700 In: 1200 [IV Piggyback:1200] Out: 450 [Urine:450] Intake/Output this shift: Total I/O In: 240 [P.O.:240] Out: 200 [Urine:200]  PE: Gen: Awake and alert, NAD Heart: RRR Lungs: CTA b/l Abd: Soft, ND, very mild generalized tenderness without rebound, rigidity or gaurding. No signs of peritonitis. Normoactive bowel signs. 3 Msk: No edema  Lab Results:  Recent Labs    04/14/18 1125 04/15/18 0734  WBC 10.9* 7.6  HGB 16.0 16.1  HCT 45.9 46.5  PLT 272 248   BMET Recent Labs    04/14/18 1125 04/15/18 0734  NA 136 137  K 3.0* 3.6  CL 98 99  CO2 29 28  GLUCOSE 95 90  BUN <5* 6  CREATININE 0.88 0.99  CALCIUM 9.1 8.8*   PT/INR No results for input(s): LABPROT, INR in the last 72 hours. CMP     Component Value Date/Time   NA 137 04/15/2018 0734   K 3.6 04/15/2018 0734   CL 99 04/15/2018 0734   CO2 28 04/15/2018 0734   GLUCOSE 90 04/15/2018 0734   BUN 6 04/15/2018 0734   CREATININE 0.99 04/15/2018 0734   CALCIUM 8.8 (L) 04/15/2018 0734   PROT 6.5 04/15/2018 0734   ALBUMIN 3.3 (L) 04/15/2018 0734   AST 73 (H) 04/15/2018 0734   ALT 66 (H) 04/15/2018 0734   ALKPHOS 72 04/15/2018 0734   BILITOT 0.8 04/15/2018 0734   GFRNONAA >60  04/15/2018 0734   GFRAA >60 04/15/2018 0734   Lipase     Component Value Date/Time   LIPASE 23 04/14/2018 1125       Studies/Results: Abd 1 View (kub)  Result Date: 04/15/2018 CLINICAL DATA:  Upper abdominal pain EXAM: ABDOMEN - 1 VIEW COMPARISON:  04/14/2018 FINDINGS: Scattered large and small bowel gas is noted. No obstructive changes are seen. No abnormal mass or abnormal calcifications are noted. No acute bony abnormality is seen. IMPRESSION: The small-bowel dilatation seen on the prior exam is not as well appreciated on today's study. Electronically Signed   By: Alcide Clever M.D.   On: 04/15/2018 07:19   Ct Abdomen Pelvis W Contrast  Result Date: 04/14/2018 CLINICAL DATA:  Abdominal distension. EXAM: CT ABDOMEN AND PELVIS WITH CONTRAST TECHNIQUE: Multidetector CT imaging of the abdomen and pelvis was performed using the standard protocol following bolus administration of intravenous contrast. CONTRAST:  OMNIPAQUE IOHEXOL 300 MG/ML  SOLN COMPARISON:  06/15/2016 FINDINGS: Lower chest: No acute abnormality. Hepatobiliary: No focal liver abnormality is seen. Status post cholecystectomy. No biliary dilatation. Pancreas: Unremarkable. No pancreatic ductal dilatation or surrounding inflammatory changes. Spleen: Normal in size without focal abnormality. Adrenals/Urinary Tract: Adrenal glands are unremarkable. Kidneys are normal, without renal calculi, focal lesion, or hydronephrosis. Bladder is unremarkable.  Stomach/Bowel: Normal appearance of the stomach and colon. Post appendectomy. Several abnormally dilated small bowel loops in the central abdomen with gradual transition to normal caliber in the left central abdomen. There is a short segment of circumferential mucosal thickening of the small bowel just distal to the area of dilation, likely causing the upstream dilation. Vascular/Lymphatic: No significant vascular findings are present. No enlarged abdominal or pelvic lymph nodes. Scattered  sub pathologic central mesenteric lymph nodes. Reproductive: Prostate is unremarkable. Other: No abdominal wall hernia or abnormality. No abdominopelvic ascites. Musculoskeletal: No acute or significant osseous findings. IMPRESSION: Early or incomplete small bowel obstruction within the mid abdomen. Short segment of circumferential mucosal thickening of the small bowel just distal to the area of dilation may be causing the upstream obstruction. Alternatively it may be due to adhesions. Closed loop obstruction can not be entirely excluded. Electronically Signed   By: Ted Mcalpine M.D.   On: 04/14/2018 16:14    Anti-infectives: Anti-infectives (From admission, onward)   None       Assessment/Plan HTN  Possible small bowel obstruction on CT - The patient continues to have bowel function (flatus and BM's). He has tolerated FLD, and been without N/V for several days. He has normoactive bowel sounds on exam and no signs of peritonitis. Follow up xray this AM without small bowel dilatation or signs of SBO. His leukocytosis has resolved. He reports all family members in his home have similar symptoms. His symptoms are more suggestive of enteritis. Primary team has ordered stool cultures. No indication for acute surgical intervention. Patient can advance diet as tolerated and will defer further treatment to primary team. We will sign off.     LOS: 1 day    Jacinto Halim , [facility] 04/15/2018, 10:05 AM Pager: [phone]

## 2018-04-15 NOTE — Progress Notes (Signed)
   Subjective: Mr. Hoye has had 2 loose BMs since admission. No further abdominal pain, nausea or vomiting. He is hungry. Explained his symptoms are most likely a gastroenteritis. Discussed plan for more lab work to evaluate his HTN. He'd like to try eating breakfast and possibly go home later today.   Objective:  Vital signs in last 24 hours: Vitals:   04/14/18 1840 04/14/18 1842 04/14/18 1935 04/15/18 0354  BP: (!) 147/74  124/74 111/66  Pulse: 82  91 83  Resp: 18  18 17   Temp: 98.4 F (36.9 C)  98.3 F (36.8 C) 98 F (36.7 C)  TempSrc: Oral  Oral Oral  SpO2: 98%  98% 96%  Weight:  116.6 kg 115.5 kg   Height:  6' (1.829 m)     General: awake, alert, lying in bed in NAD CV: RRR; no murmurs, rubs or gallops Pulm: normal respiratory effort; lungs CTAB Abd: BS+; abdomen is obese with diffuse striae, soft, mild tenderness with palpation, non-distended  Ext: no edema  Assessment/Plan:  Active Problems:   Gastroenteritis  Mr. Haldane is a 23 yo man with a medical history of HTN, asthma, and multiple abdominal surgeries (open appendectomy, laparoscopic cholecystectomy, and open umbilical hernia repair) who presented with abdominal pain, abdominal spasms, and diarrhea after a recent admission for nausea, vomiting, and rule out SBO. He was admitted for further evaluation and management.  Gastroenteritis  - Likely viral gastroenteritis. Unlikely to be SBO despite initial CT imaging because he is passing stool and has no n/v. Abdominal pain has resolved today. KUB without obstruction.  - Afebrile. Leukocytosis has resolved.  - If patient tolerates breakfast, likely discharge home Plan - General surgery has signed off - Discontinue IVF if tolerates PO  - Discontinue morphine - Full liquid diet. ADAT.  HTN - Normotensive this am  - Concerns for secondary HTN in this young man.  - Am cortisol was normal. No Cushing's.  - Renal function is normal. Will avoid renovascular imaging as  patient does not have insurance. Can be deferred to the outpatient setting as appropriate. Plan - renin/aldosterone levels  Hypokalemia - Resolved. Likely 2/2 hctz and diarrhea.   Dispo: Anticipated discharge today   , Cathleen Corti, MD 04/15/2018, 6:25 AM Pager: [phone]

## 2018-04-15 NOTE — Discharge Summary (Signed)
Name: Logan Dawson MRN: [id number] DOB: [birth year] 23 y.o. PCP: [facility]  Date of Admission: 04/14/2018 10:02 AM Date of Discharge: 04/15/2018 Attending Physician: Dr. Cyndie Chime  Discharge Diagnosis: 1. Viral gastroenteritis 2. HTN  Discharge Medications: Allergies as of 04/15/2018      Reactions   Sulfa Antibiotics Hives      Medication List    STOP taking these medications   FLUoxetine 20 MG tablet Commonly known as:  PROZAC   ibuprofen 600 MG tablet Commonly known as:  ADVIL,MOTRIN   metoprolol tartrate 50 MG tablet Commonly known as:  LOPRESSOR   rizatriptan 10 MG tablet Commonly known as:  MAXALT     TAKE these medications   albuterol 108 (90 Base) MCG/ACT inhaler Commonly known as:  PROVENTIL HFA;VENTOLIN HFA Inhale 2 puffs into the lungs every 6 (six) hours as needed for wheezing or shortness of breath.   hydrochlorothiazide 25 MG tablet Commonly known as:  HYDRODIURIL Take 25 mg by mouth daily.   Omega-3 1000 MG Caps Take 1,000 mg by mouth daily.   Spacer/Aero Chamber Mouthpiece Misc 1 each by Does not apply route every 6 (six) hours as needed.       Disposition and follow-up:   LoganLogan Dawson was discharged from [facility] in Good condition.  At the hospital follow up visit please address:  1.  Gastroenteritis, likely viral - Please f/u GI pathogen panel  2. HTN - Concern for secondary HTN - Please f/u renin/aldosterone labs  3.  Labs / imaging needed at time of follow-up: none  4.  Pending labs/ test needing follow-up: aldosterone and renin levels, GI pathogen panel  Follow-up Appointments:   Hospital Course by problem list: 1. Gastroenteritis, likely viral: Logan Dawson is a 23 yo man with a medical history of HTN, asthma, and multiple abdominal surgeries (open appendectomy, laparoscopic cholecystectomy, and open umbilical hernia repair) who presented with abdominal pain, abdominal  spasms, and diarrhea after a recent admission for nausea, vomiting, and rule out SBO. Initial CT showed possible early SBO. However, patient was passing stool and had no vomiting. His pain resolved. He was tolerating a PO diet. KUB without signs of obstruction. C. Diff negative. GI pathogen panel pending at discharge. Discharged home with instructions to f/u with PCP if symptoms do not resolve.   2. HTN: Concern for secondary HTN in this young man. Normal AM cortisol. Normal kidney function. Renal/aldosterone levels pending at discharge. Continued home hctz.   Discharge Vitals:   BP 114/66 (BP Location: Left Arm)   Pulse 78   Temp 98.1 F (36.7 C) (Oral)   Resp 20   Ht 6' (1.829 m)   Wt 115.5 kg   SpO2 98%   BMI 34.53 kg/m   Pertinent Labs, Studies, and Procedures:  CBC Latest Ref Rng & Units 04/15/2018 04/14/2018 05/22/2016  WBC 4.0 - 10.5 K/uL 7.6 10.9(H) 11.0(H)  Hemoglobin 13.0 - 17.0 g/dL 38.7 56.4 33.2  Hematocrit 39.0 - 52.0 % 46.5 45.9 44.8  Platelets 150 - 400 K/uL 248 272 240   CMP Latest Ref Rng & Units 04/15/2018 04/14/2018 05/22/2016  Glucose 70 - 99 mg/dL 90 95 95  BUN 6 - 20 mg/dL 6 <9(J) 12  Creatinine 0.61 - 1.24 mg/dL 1.88 4.16 6.06  Sodium 135 - 145 mmol/L 137 136 135  Potassium 3.5 - 5.1 mmol/L 3.6 3.0(L) 3.9  Chloride 98 - 111 mmol/L 99 98 101  CO2 22 - 32 mmol/L  28 29 25   Calcium 8.9 - 10.3 mg/dL 2.4(M) 9.1 9.0  Total Protein 6.5 - 8.1 g/dL 6.5 6.9 7.0  Total Bilirubin 0.3 - 1.2 mg/dL 0.8 0.5 0.5  Alkaline Phos 38 - 126 U/L 72 62 72  AST 15 - 41 U/L 73(H) 62(H) 58(H)  ALT 0 - 44 U/L 66(H) 44 36   CT abdomen pelvis 3/9 Early or incomplete small bowel obstruction within the mid abdomen. Short segment of circumferential mucosal thickening of the small bowel just distal to the area of dilation may be causing the upstream obstruction. Alternatively it may be due to adhesions. Closed loop obstruction can not be entirely excluded.  KUB 3/10 Scattered large and  small bowel gas is noted. No obstructive changes are seen. No abnormal mass or abnormal calcifications are noted. No acute bony abnormality is seen.  Discharge Instructions:   Signed: Dionne Ano, MD 04/15/2018, 11:36 AM   Pager: [phone]

## 2018-04-15 NOTE — Progress Notes (Signed)
[facility] called at 5:34 p stating pt was positive for Norovirus, notified Internal medicine resident Guill[place].

## 2018-04-17 LAB — ALDOSTERONE + RENIN ACTIVITY W/ RATIO
ALDO / PRA Ratio: <1.7 (ref 0.0–30.0)
Aldosterone: <1 ng/dL (ref 0.0–30.0)
PRA LC/MS/MS: 0.587 ng/mL/hr (ref 0.167–5.380)

## 2018-09-29 IMAGING — US US ABDOMEN COMPLETE
1 series · 13 of 25 positions shown · non-contrast
Comparison: 04/01/2014 CT abdomen/ pelvis.

CLINICAL DATA: Intermittent right upper quadrant abdominal pain and
nausea since this morning. Patient reportedly had a meal at 5 p.m..

EXAM:
ABDOMEN ULTRASOUND COMPLETE

[Series 1: us abdomen complete · 0.20mm/px · 13 of 87 slices shown]
[im 1/87]
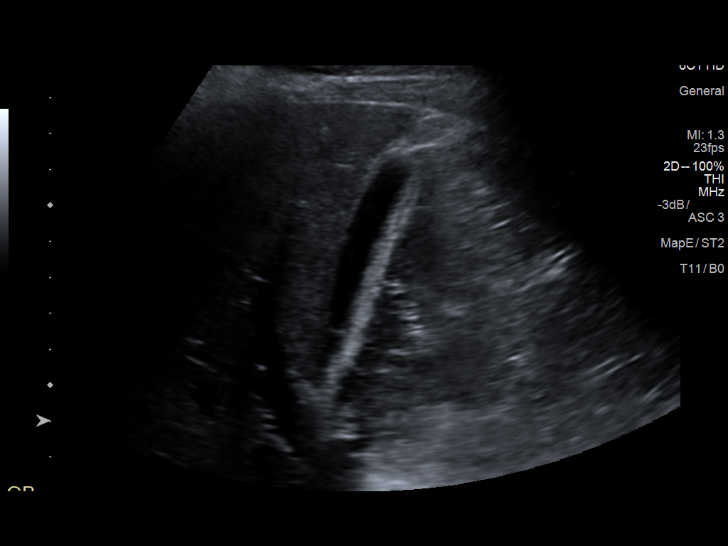
[im 8/87]
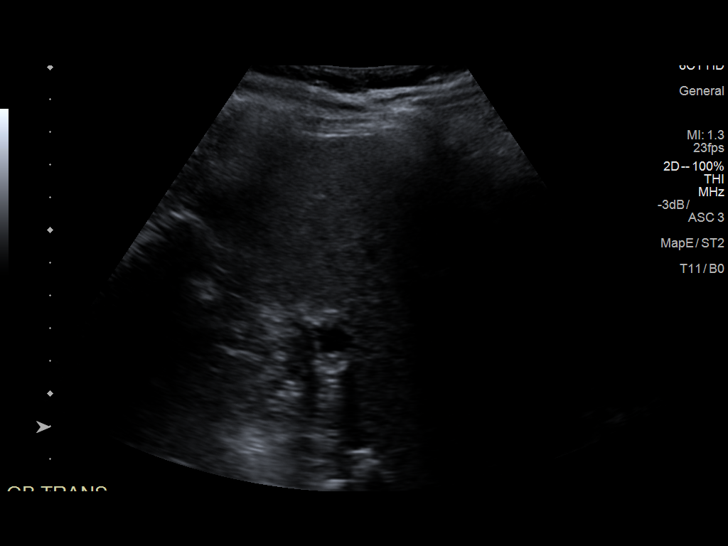
[im 15/87]
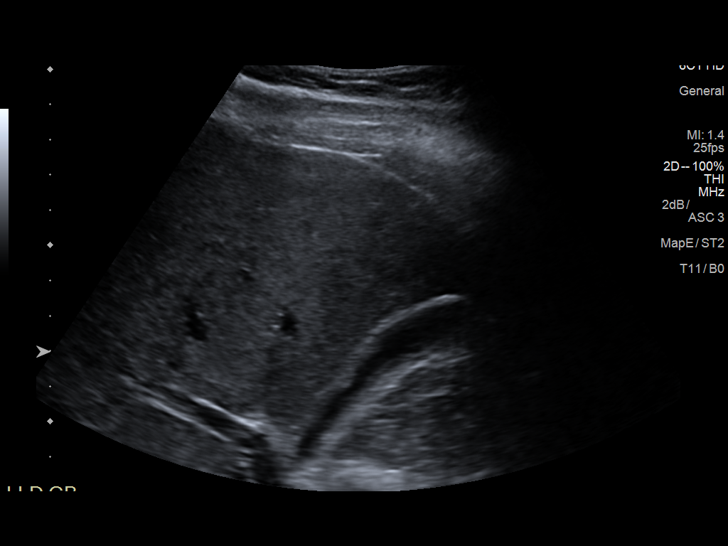
[im 22/87]
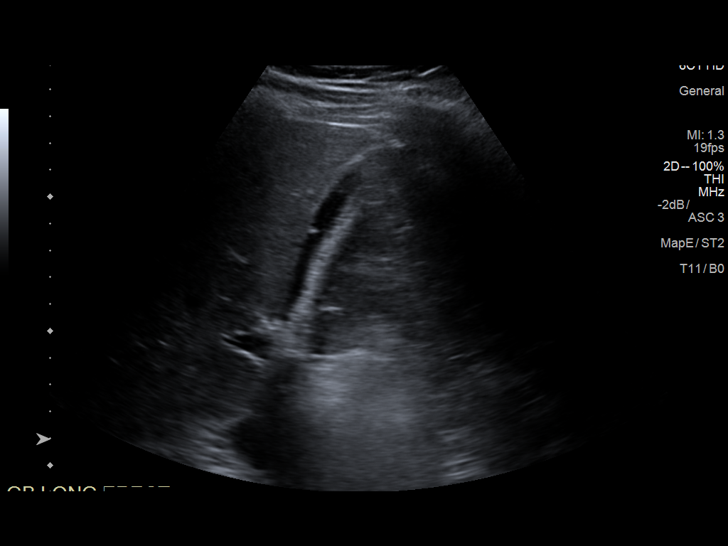
[im 29/87]
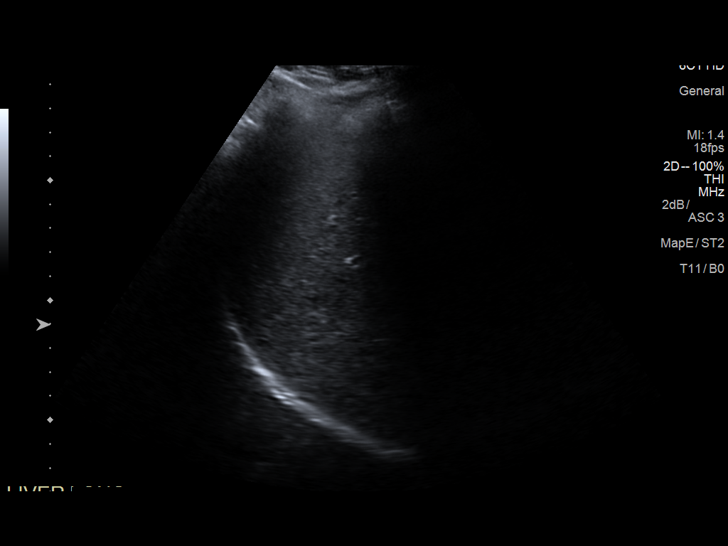
[im 36/87]
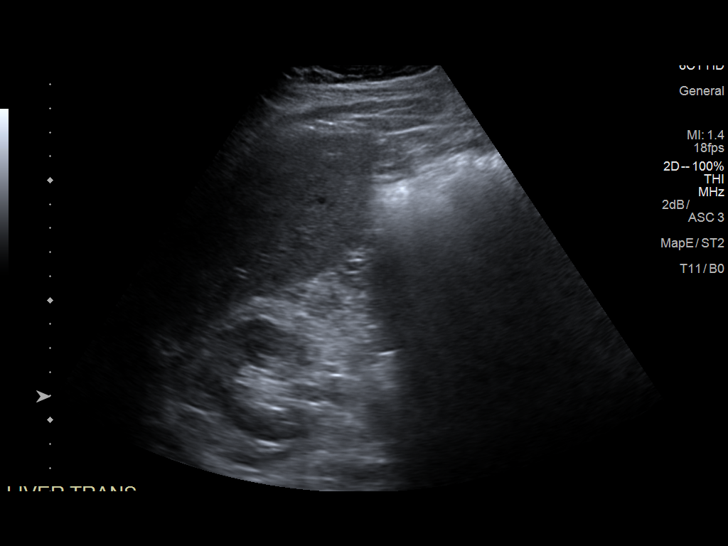
[im 44/87]
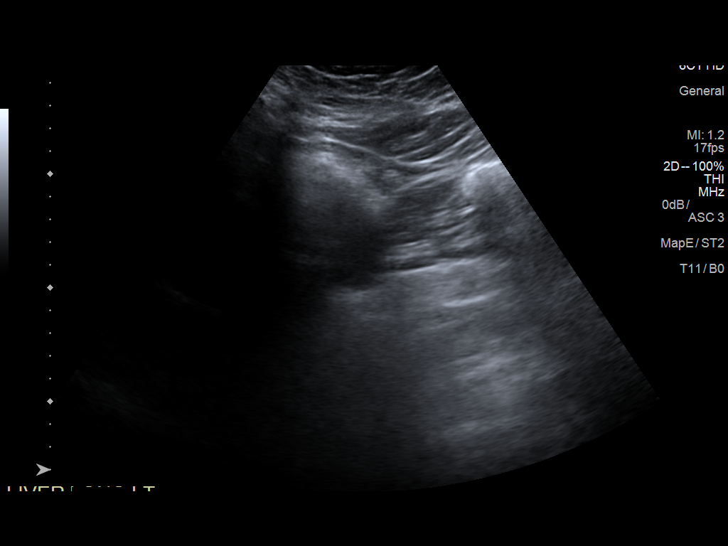
[im 51/87]
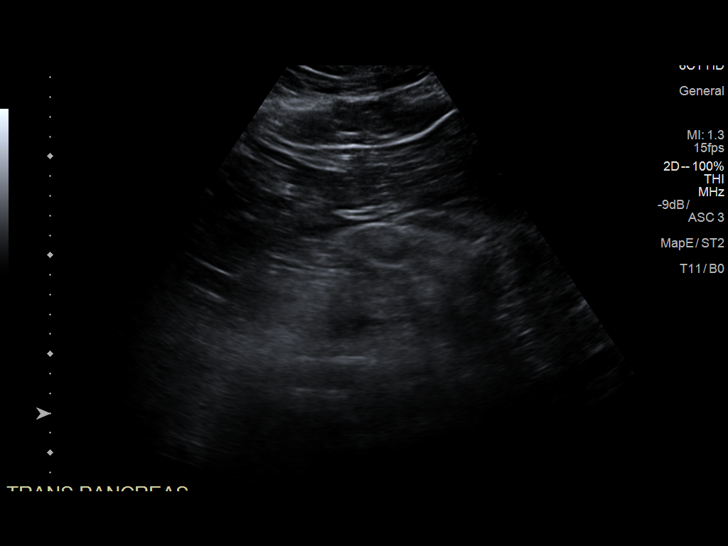
[im 58/87]
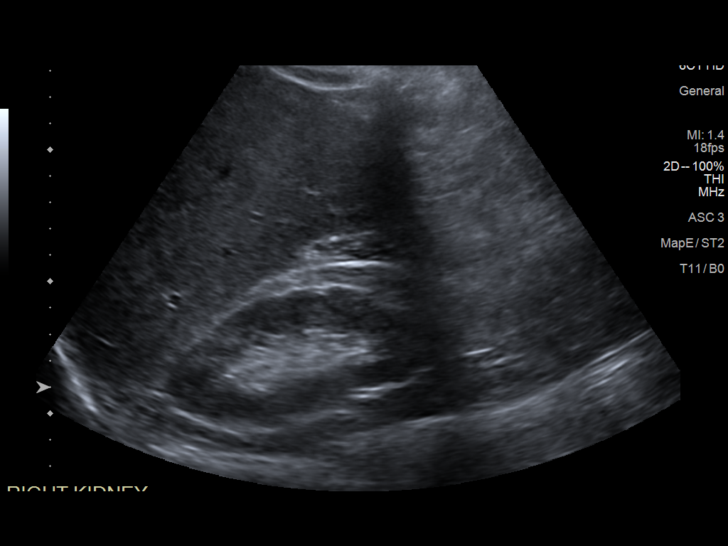
[im 65/87]
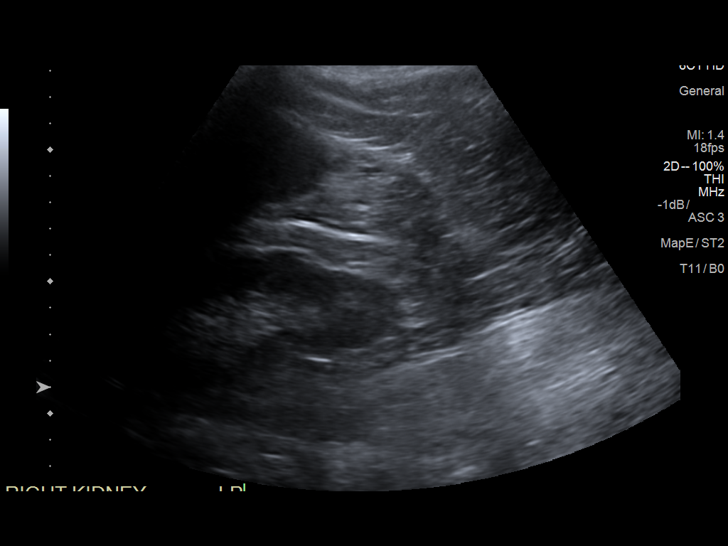
[im 72/87]
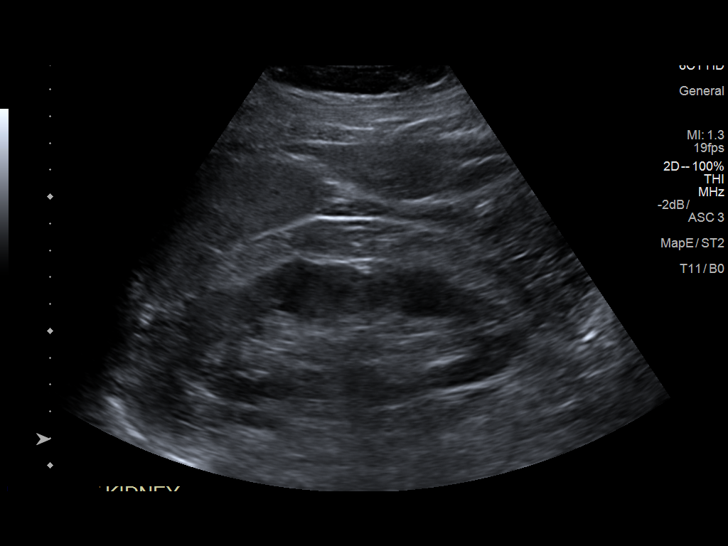
[im 79/87]
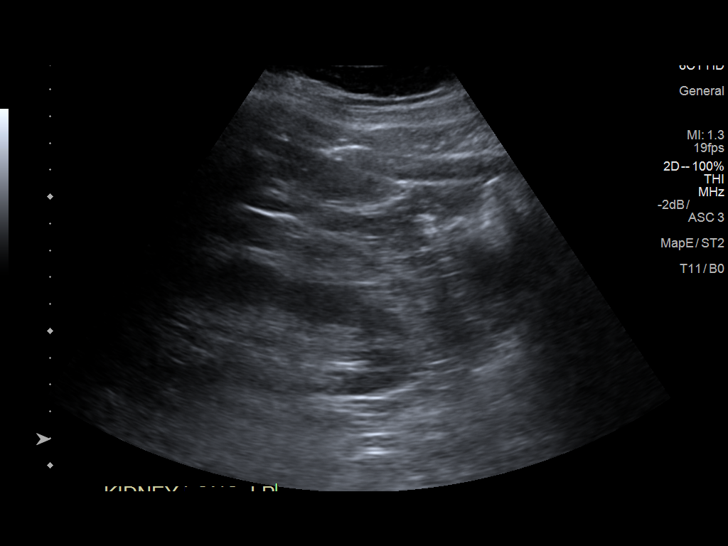
[im 87/87]
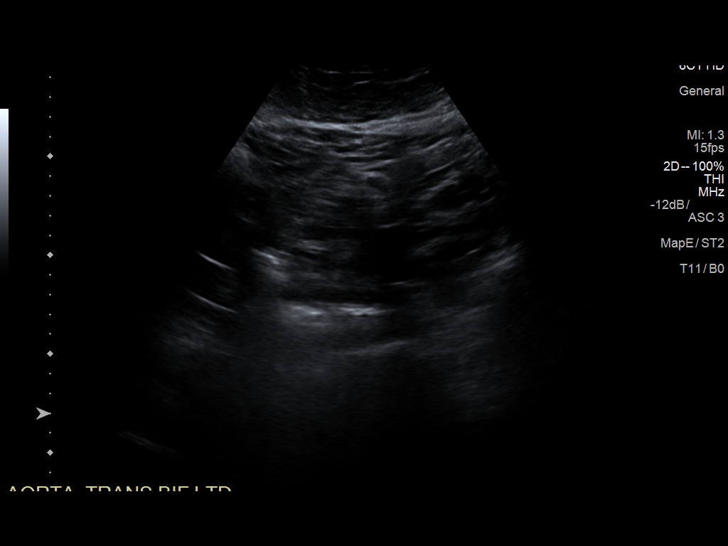

[13 of 25 positions shown; findings below may reference images not displayed]

FINDINGS: Gallbladder: Contracted gallbladder, which limits gallbladder
evaluation. A few scattered gallbladder polyps are demonstrated,
largest 5 mm. No shadowing gallstones. No definite gallbladder wall
thickening. No pericholecystic fluid or sonographic Murphy's sign.

Common bile duct: Diameter: 2 mm

Liver: No focal lesion identified. Within normal limits in
parenchymal echogenicity.

IVC: Obscured by overlying bowel gas.

Pancreas: Obscured by overlying bowel gas.

Spleen: Size and appearance within normal limits.

Right Kidney: Length: 10.5 cm. Echogenicity within normal limits. No
mass or hydronephrosis visualized.

Left Kidney: Length: 13.3 cm. Echogenicity within normal limits. No
mass or hydronephrosis visualized.

Abdominal aorta: No aneurysm visualized.

Other findings: None.
IMPRESSION: 1. Contracted gallbladder, limiting gallbladder evaluation. No
evidence of cholelithiasis or acute cholecystitis. Scattered
gallbladder polyps, largest 5 mm, compatible with benign cholesterol
polyps, for which no further follow-up is required. This
recommendation follows ACR consensus guidelines: White Paper of the
ACR Incidental Findings Committee II on Gallbladder and Biliary
Findings. [HOSPITAL] 6378:;[DATE].
2. Otherwise normal abdominal sonogram, with limitations as
described.

## 2018-10-21 ENCOUNTER — Emergency Department (HOSPITAL_COMMUNITY): Payer: Self-pay

## 2018-10-21 ENCOUNTER — Encounter (HOSPITAL_COMMUNITY): Payer: Self-pay | Admitting: Emergency Medicine

## 2018-10-21 ENCOUNTER — Emergency Department (HOSPITAL_COMMUNITY)
Admission: EM | Admit: 2018-10-21 | Discharge: 2018-10-21 | Disposition: A | Discharge status: Home / Self Care | Payer: Self-pay | Attending: Emergency Medicine | Admitting: Emergency Medicine

## 2018-10-21 ENCOUNTER — Other Ambulatory Visit: Payer: Self-pay

## 2018-10-21 DIAGNOSIS — J45909 Unspecified asthma, uncomplicated: Secondary | ICD-10-CM | POA: Insufficient documentation

## 2018-10-21 DIAGNOSIS — Z79899 Other long term (current) drug therapy: Secondary | ICD-10-CM | POA: Insufficient documentation

## 2018-10-21 DIAGNOSIS — R000 Tachycardia, unspecified: Secondary | ICD-10-CM | POA: Insufficient documentation

## 2018-10-21 DIAGNOSIS — I10 Essential (primary) hypertension: Secondary | ICD-10-CM | POA: Insufficient documentation

## 2018-10-21 DIAGNOSIS — R1030 Lower abdominal pain, unspecified: Secondary | ICD-10-CM | POA: Insufficient documentation

## 2018-10-21 DIAGNOSIS — R079 Chest pain, unspecified: Secondary | ICD-10-CM | POA: Insufficient documentation

## 2018-10-21 DIAGNOSIS — R6883 Chills (without fever): Secondary | ICD-10-CM | POA: Insufficient documentation

## 2018-10-21 DIAGNOSIS — R197 Diarrhea, unspecified: Secondary | ICD-10-CM | POA: Insufficient documentation

## 2018-10-21 DIAGNOSIS — R112 Nausea with vomiting, unspecified: Secondary | ICD-10-CM | POA: Insufficient documentation

## 2018-10-21 DIAGNOSIS — R52 Pain, unspecified: Secondary | ICD-10-CM

## 2018-10-21 DIAGNOSIS — R0602 Shortness of breath: Secondary | ICD-10-CM | POA: Insufficient documentation

## 2018-10-21 HISTORY — DX: Unspecified fracture of skull, initial encounter for closed fracture: S02.91XA

## 2018-10-21 LAB — URINALYSIS, ROUTINE W REFLEX MICROSCOPIC
Bacteria, UA: NONE SEEN
Bilirubin Urine: NEGATIVE
Glucose, UA: NEGATIVE mg/dL
Hgb urine dipstick: NEGATIVE
Ketones, ur: 5 mg/dL — AB
Nitrite: NEGATIVE
Protein, ur: NEGATIVE mg/dL
Specific Gravity, Urine: 1.02 (ref 1.005–1.030)
pH: 6 (ref 5.0–8.0)

## 2018-10-21 LAB — COMPREHENSIVE METABOLIC PANEL
ALT: 41 U/L (ref 0–44)
AST: 56 U/L — ABNORMAL HIGH (ref 15–41)
Albumin: 4.7 g/dL (ref 3.5–5.0)
Alkaline Phosphatase: 99 U/L (ref 38–126)
Anion gap: 13 (ref 5–15)
BUN: 13 mg/dL (ref 6–20)
CO2: 25 mmol/L (ref 22–32)
Calcium: 9.5 mg/dL (ref 8.9–10.3)
Chloride: 99 mmol/L (ref 98–111)
Creatinine, Ser: 1.03 mg/dL (ref 0.61–1.24)
GFR calc Af Amer: >60 mL/min (ref >60)
GFR calc non Af Amer: >60 mL/min (ref >60)
Glucose, Bld: 98 mg/dL (ref 70–99)
Potassium: 3.8 mmol/L (ref 3.5–5.1)
Sodium: 137 mmol/L (ref 135–145)
Total Bilirubin: 1 mg/dL (ref 0.3–1.2)
Total Protein: 8.2 g/dL — ABNORMAL HIGH (ref 6.5–8.1)

## 2018-10-21 LAB — CBC
HCT: 50.7 % (ref 39.0–52.0)
Hemoglobin: 18.4 g/dL — ABNORMAL HIGH (ref 13.0–17.0)
MCH: 31.3 pg (ref 26.0–34.0)
MCHC: 36.3 g/dL — ABNORMAL HIGH (ref 30.0–36.0)
MCV: 86.4 fL (ref 80.0–100.0)
Platelets: 285 10*3/uL (ref 150–400)
RBC: 5.87 MIL/uL — ABNORMAL HIGH (ref 4.22–5.81)
RDW: 12.2 % (ref 11.5–15.5)
WBC: 20.1 10*3/uL — ABNORMAL HIGH (ref 4.0–10.5)
nRBC: 0 % (ref 0.0–0.2)

## 2018-10-21 LAB — LIPASE, BLOOD: Lipase: 26 U/L (ref 11–51)

## 2018-10-21 LAB — TROPONIN I (HIGH SENSITIVITY): Troponin I (High Sensitivity): 6 ng/L (ref <18)

## 2018-10-21 LAB — TSH: TSH: 1.642 u[IU]/mL (ref 0.350–4.500)

## 2018-10-21 MED ORDER — FAMOTIDINE 40 MG PO TABS: 40.0000 mg | ORAL_TABLET | Freq: Every day | ORAL | 0 refills | Status: DC

## 2018-10-21 MED ORDER — ONDANSETRON HCL 4 MG PO TABS: 4.0000 mg | ORAL_TABLET | Freq: Four times a day (QID) | ORAL | 0 refills | Status: DC

## 2018-10-21 MED ORDER — IOHEXOL 300 MG/ML  SOLN
100.0000 mL | Freq: Once | INTRAMUSCULAR | Status: AC | PRN
  Administered 2018-10-21: 100 mL via INTRAVENOUS

## 2018-10-21 MED ORDER — SODIUM CHLORIDE 0.9% FLUSH
3.0000 mL | Freq: Once | INTRAVENOUS | Status: AC
  Administered 2018-10-21: 3 mL via INTRAVENOUS

## 2018-10-21 MED ORDER — SODIUM CHLORIDE 0.9 % IV BOLUS
1000.0000 mL | Freq: Once | INTRAVENOUS | Status: AC
  Administered 2018-10-21: 1000 mL via INTRAVENOUS

## 2018-10-21 MED ORDER — ONDANSETRON HCL 4 MG/2ML IJ SOLN
4.0000 mg | Freq: Once | INTRAMUSCULAR | Status: AC
  Administered 2018-10-21: 4 mg via INTRAVENOUS
  Filled 2018-10-21: qty 2

## 2018-10-21 NOTE — ED Triage Notes (Signed)
Onset today developed shortness of breath ,chills, nausea, and diarrhea. In triage hear rate 150. Alert answering and following commands appropriate.

## 2018-10-21 NOTE — Discharge Instructions (Addendum)
Your CT scan showed no acute disease today.  Please follow up with gastroenterologist if needed as discussed.  Take Pepcid as described and discuss reflux disease during your appointment with gastroenterologist or primary care.  Return to ED if you experience persistent vomiting, fever, pain or worsening symptoms.   You have been prescribed Pepcid for GERD and Zofran for nausea.  Zofran you can take as needed for nausea.

## 2018-10-21 NOTE — ED Notes (Signed)
Patient verbalizes understanding of discharge instructions. Opportunity for questioning and answers were provided. Armband removed by staff, pt discharged from ED.  

## 2018-10-21 NOTE — ED Provider Notes (Addendum)
[facility] EMERGENCY DEPARTMENT Provider Note   CSN: [id number] Arrival date & time: 10/21/18  1817     History   Chief Complaint Chief Complaint  Patient presents with  . Nausea  . Diarrhea  . Chills    HPI Logan Dawson is a 23 y.o. male. Presents for cramping abdominal pain that is 8 out of 10 in severity and began at 3:45 PM today after vomiting x1 non-bilious/non-bloody.  Patient states he had one episode of diarrhea as well today with no blood or tarry appearance.  Patient states on the way to work he felt chills but not feverish.   Patient states his only medical problem is hypertension which is treated with metoprolol since his heart rate is "consistently high".  Patient has had his appendix and gallbladder removed and has a history of hernia repair.   Patient denies any current chest pain, shortness of breath, leg swelling, shortness of breath.  Patient states he has occasional burning chest pain in the past that is worse at night and accompanied by a bad taste. Patient denies any testicular pain or urinary symptoms such as dysuria or hematuria.     HPI  Past Medical History:  Diagnosis Date  . ADHD (attention deficit hyperactivity disorder)   . Asthma   . Hypertension   . Left shoulder pain   . Meningitis   . Skull fracture [facility])     Patient Active Problem List   Diagnosis Date Noted  . Gastroenteritis 04/14/2018  . Black-out (not amnesia) 11/27/2012  . Concussion with < 1 hr loss of consciousness 11/27/2012  . Hearing voices 11/27/2012  . Skull fracture with concussion ([place]) 11/27/2012  . Subarachnoid hemorrhage ([place]) 11/27/2012  . Unable to control anger 11/27/2012  . Abrasions of multiple sites 11/14/2012  . Linear skull fracture ([facility]) 11/14/2012  . SAH (subarachnoid hemorrhage) ([place]) 11/14/2012  . Trauma 11/14/2012    Past Surgical History:  Procedure Laterality Date  . APPENDECTOMY    . CHOLECYSTECTOMY    . HERNIA REPAIR           Home Medications    Prior to Admission medications   Medication Sig Start Date End Date Taking? Authorizing Provider  albuterol (PROVENTIL HFA;VENTOLIN HFA) 108 (90 Base) MCG/ACT inhaler Inhale 2 puffs into the lungs every 6 (six) hours as needed for wheezing or shortness of breath. 07/20/16   Eustaquio Maize, MD  hydrochlorothiazide (HYDRODIURIL) 25 MG tablet Take 25 mg by mouth daily.    [provider]  Omega-3 1000 MG CAPS Take 1,000 mg by mouth daily.     [provider]  Spacer/Aero Chamber Mouthpiece MISC 1 each by Does not apply route every 6 (six) hours as needed. 07/20/16   Eustaquio Maize, MD    Family History Family History  Problem Relation Age of Onset  . Diabetes Mother   . COPD Mother   . Diabetes Father   . Hypertension Father   . Heart disease Father     Social History Social History   Tobacco Use  . Smoking status: Never Smoker  . Smokeless tobacco: Never Used  Substance Use Topics  . Alcohol use: Yes    Comment: occ.  . Drug use: No     Allergies   Sulfa antibiotics   Review of Systems Review of Systems  Constitutional: Negative for chills and fever.  HENT: Negative for congestion and ear pain.   Eyes: Negative for pain.  Respiratory: Negative  for cough and shortness of breath.   Cardiovascular: Negative for chest pain.  Gastrointestinal: Positive for abdominal pain, diarrhea, nausea and vomiting. Negative for blood in stool and constipation.  Endocrine: Negative for polydipsia and polyuria.  Genitourinary: Negative for difficulty urinating, dysuria and hematuria.  Musculoskeletal: Negative for myalgias.  Neurological: Positive for headaches. Negative for dizziness.     Physical Exam Updated Vital Signs BP (!) 148/98   Pulse (!) 150   Temp 97.9 F (36.6 C) (Oral)   Resp 16   Ht 6' (1.829 m)   Wt 117.9 kg   SpO2 99%   BMI 35.26 kg/m   Physical Exam Vitals signs and nursing note reviewed.   Constitutional:      General: He is not in acute distress.    Appearance: Normal appearance. He is obese. He is not ill-appearing.  HENT:     Head: Normocephalic and atraumatic.     Mouth/Throat:     Mouth: Mucous membranes are moist.  Eyes:     General: No scleral icterus. Neck:     Musculoskeletal: Normal range of motion. No neck rigidity.  Cardiovascular:     Rate and Rhythm: Regular rhythm. Tachycardia present.     Pulses: Normal pulses.     Heart sounds: Normal heart sounds.     Comments: HR 125 Pulmonary:     Effort: Pulmonary effort is normal.     Breath sounds: Normal breath sounds.  Abdominal:     General: Abdomen is flat. Bowel sounds are normal.     Palpations: Abdomen is soft.     Tenderness: There is abdominal tenderness (Mild suprapubic tenderness to palpation). There is no guarding or rebound.  Musculoskeletal:     Right lower leg: No edema.     Left lower leg: No edema.  Skin:    General: Skin is warm and dry.     Capillary Refill: Capillary refill takes less than 2 seconds.  Neurological:     Mental Status: He is alert. Mental status is at baseline.  Psychiatric:        Behavior: Behavior normal.      ED Treatments / Results  Labs (all labs ordered are listed, but only abnormal results are displayed) Labs Reviewed  CBC  COMPREHENSIVE METABOLIC PANEL  TROPONIN I (HIGH SENSITIVITY)    EKG None  Radiology Dg Chest 2 View  Result Date: 10/21/2018 CLINICAL DATA:  Shortness of breath rapid heart rate EXAM: CHEST - 2 VIEW COMPARISON:  09/08/2015, 05/24/2016 FINDINGS: The heart size and mediastinal contours are within normal limits. Both lungs are clear. Minimal wedging at the thoracolumbar junction. IMPRESSION: No active cardiopulmonary disease. Electronically Signed   By: Jasmine PangKim  Fujinaga M.D.   On: 10/21/2018 19:22    Procedures Procedures (including critical care time)  Medications Ordered in ED Medications  sodium chloride flush (NS) 0.9 %  injection 3 mL (has no administration in time range)     Initial Impression / Assessment and Plan / ED Course  I have reviewed the triage vital signs and the nursing notes.  Pertinent labs & imaging results that were available during my care of the patient were reviewed by me and considered in my medical decision making (see chart for details).   Patient is a 23 year old male presenting with abdominal pain and vomiting that began today.  Patient has a white count of 20.1 and lower abdominal tenderness to palpation.   The causes of generalized abdominal pain include but are  not limited to AAA, mesenteric ischemia, appendicitis, diverticulitis, DKA, gastritis, gastroenteritis, AMI, nephrolithiasis, pancreatitis, peritonitis, adrenal insufficiency,lead poisoning, iron toxicity, intestinal ischemia, constipation, UTI,SBO/LBO, splenic rupture, biliary disease, IBD, IBS, PUD, or hepatitis.   Patient has no prior cardiac disease and apart from family history is no significant risk factors for CAD, no signs or symptoms of pneumonia.  Despite elevated heart rate patient has a well score of 1.5 points which correlates to a 1.3% chance of PE in ED patients; patient has no chest pain and does not feel short of breath.  Patient's lack of urinary symptoms makes kidney stone or UTI unlikely.   CXR ordered in triage has been seen and interpreted by myself - there is no infiltrate or pneumothorax - normal mediastinum. No fracture evident.  Troponin ordered in triage is within normal limits, patient has no current chest pain or shortness of breath; no need for second troponin at this time.  Patient's history of past chest pain in the past is consistent with GERD heartburn -- will prescribe pepcid at discharge.   EKG shows tachycardia.  CT abdomen pelvis ordered due to elevated WBC and concern for small bowel obstruction, abscess, diverticulitis, perforation, hernia.   Zofran 4mg  for nausea and 1L NS bolus.  On  reassessment after CT exam patient heart rate is now 87 and patient states abdominal pain is no longer present.  Patient is no longer nauseous after Zofran.  Discussed the patient that emergent diagnoses have been excluded and that CT scan showed no acute concern.  Discussed need for follow-up with gastroenterology in 1 to 2 weeks for persistent symptoms.  Prescribed patient Zofran for nausea if it returns and Pepcid for GERD as patient described chronic symptoms for years of heartburn that is worse with reclining.  Patient is able to tolerate p.o. and repeat abdominal exam without tenderness.  Vital signs are within normal limits at time of discharge.  Instructed to return for new or concerning symptoms.  A CT scan was performed to evaluate for potential causes of the abdominal pain, however, neither the clinical exam nor the CT has identified an emergent etiology for the abdominal pain. Specifically, given the benign exam, the laboratory studies, and unremarkable CT, I have a very low suspicion for abscess, ischemic bowel, bowel perforation, or any other life threatening disease. I have discussed with the patient the level of uncertainty with undifferentiated abdominal pain and clearly explained the need to follow-up as noted on the discharge instructions, or return to the Emergency Department immediately if the pain worsens, develops fever, persistent and uncontrollable vomiting, or for any new symptoms or concerns.         Final Clinical Impressions(s) / ED Diagnoses   Final diagnoses:  None    ED Discharge Orders    None       Gailen Shelter, [place] 10/21/18 2144    Gailen Shelter, [place] 10/21/18 2236    Sabas Sous, MD 10/21/18 [phone]

## 2018-11-14 ENCOUNTER — Emergency Department (HOSPITAL_COMMUNITY)
Admission: EM | Admit: 2018-11-14 | Discharge: 2018-11-14 | Disposition: A | Discharge status: Home / Self Care | Payer: Self-pay | Attending: Emergency Medicine | Admitting: Emergency Medicine

## 2018-11-14 ENCOUNTER — Encounter (HOSPITAL_COMMUNITY): Payer: Self-pay | Admitting: Emergency Medicine

## 2018-11-14 ENCOUNTER — Emergency Department (HOSPITAL_COMMUNITY): Payer: Self-pay

## 2018-11-14 ENCOUNTER — Other Ambulatory Visit: Payer: Self-pay

## 2018-11-14 DIAGNOSIS — F909 Attention-deficit hyperactivity disorder, unspecified type: Secondary | ICD-10-CM | POA: Insufficient documentation

## 2018-11-14 DIAGNOSIS — I10 Essential (primary) hypertension: Secondary | ICD-10-CM | POA: Insufficient documentation

## 2018-11-14 DIAGNOSIS — M25551 Pain in right hip: Secondary | ICD-10-CM | POA: Insufficient documentation

## 2018-11-14 DIAGNOSIS — Z8709 Personal history of other diseases of the respiratory system: Secondary | ICD-10-CM | POA: Insufficient documentation

## 2018-11-14 DIAGNOSIS — Z79899 Other long term (current) drug therapy: Secondary | ICD-10-CM | POA: Insufficient documentation

## 2018-11-14 MED ORDER — METHOCARBAMOL 500 MG PO TABS: 500.0000 mg | ORAL_TABLET | Freq: Two times a day (BID) | ORAL | 0 refills | Status: DC

## 2018-11-14 MED ORDER — MELOXICAM 7.5 MG PO TABS: 15.0000 mg | ORAL_TABLET | Freq: Every day | ORAL | 0 refills | Status: DC

## 2018-11-14 NOTE — Discharge Instructions (Signed)
Take the prescribed medication as directed.  Can try using heat and ice as well. Follow-up with your primary care doctor. Return to the ED for new or worsening symptoms.

## 2018-11-14 NOTE — ED Notes (Signed)
ED Provider at bedside. 

## 2018-11-14 NOTE — ED Notes (Signed)
Patient transported to X-ray 

## 2018-11-14 NOTE — ED Triage Notes (Signed)
Pt to ED with c/o right hip pain x's 4 days.  Pt denies any injury

## 2018-11-14 NOTE — ED Provider Notes (Signed)
[facility]   CSN: [id number] Arrival date & time: 11/14/18  0140     History   Chief Complaint Chief Complaint  Patient presents with  . Hip Pain    HPI Logan Dawson is a 23 y.o. male.     The history is provided by the patient and medical records.  Hip Pain     23 year old male with history of ADHD, asthma, hypertension, presenting to the ED with right hip pain.  States it has been bothering him since Monday.  He denies any known injury, trauma, or falls.  Pain worse along anterior and lateral right hip without radiation into the leg.  He denies any numbness or weakness of the leg.  No bowel or bladder incontinence.  States he is never had issues with his hip before.  States he was climbing up and down a ladder painting his room at home but did not feel any pain while doing so.  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  Past Medical History:  Diagnosis Date  . ADHD (attention deficit hyperactivity disorder)   . Asthma   . Hypertension   . Left shoulder pain   . Meningitis   . Skull fracture [facility](HCC)     Patient Active Problem List   Diagnosis Date Noted  . Gastroenteritis 04/14/2018  . Black-out (not amnesia) 11/27/2012  . Concussion with < 1 hr loss of consciousness 11/27/2012  . Hearing voices 11/27/2012  . Skull fracture with concussion ([facility]) 11/27/2012  . Subarachnoid hemorrhage ([facility]) 11/27/2012  . Unable to control anger 11/27/2012  . Abrasions of multiple sites 11/14/2012  . Linear skull fracture ([facility]) 11/14/2012  . SAH (subarachnoid hemorrhage) ([facility]) 11/14/2012  . Trauma 11/14/2012    Past Surgical History:  Procedure Laterality Date  . APPENDECTOMY    . CHOLECYSTECTOMY    . HERNIA REPAIR          Home Medications    Prior to Admission medications   Medication Sig Start Date End Date Taking? Authorizing Provider  famotidine (PEPCID) 40 MG  tablet Take 1 tablet (40 mg total) by mouth daily. 10/21/18   Gailen ShelterFondaw, Wylder S, PA  metoprolol succinate (TOPROL-XL) 25 MG 24 hr tablet Take 25 mg by mouth daily. 10/17/18   [provider]  ondansetron (ZOFRAN) 4 MG tablet Take 1 tablet (4 mg total) by mouth every 6 (six) hours. 10/21/18   Gailen ShelterFondaw, Wylder S, PA  sertraline (ZOLOFT) 25 MG tablet Take 25 mg by mouth daily. 10/17/18   [provider]  Spacer/Aero Chamber Mouthpiece MISC 1 each by Does not apply route every 6 (six) hours as needed. 07/20/16   Johna SheriffVincent, Carol L, MD    Family History Family History  Problem Relation Age of Onset  . Diabetes Mother   . COPD Mother   . Diabetes Father   . Hypertension Father   . Heart disease Father     Social History Social History   Tobacco Use  . Smoking status: Never Smoker  . Smokeless tobacco: Never Used  Substance Use Topics  . Alcohol use: Yes    Comment: occ.  . Drug use: No     Allergies   Sulfa antibiotics   Review of Systems Review of Systems  Musculoskeletal: Positive for arthralgias.  All other systems reviewed and are negative.    Physical Exam Updated Vital Signs BP 122/80 (BP Location: Right Arm)  Pulse (!) 109   Temp 98.3 F (36.8 C) (Oral)   Resp 18   Ht 6' (1.829 m)   Wt 118.4 kg   SpO2 98%   BMI 35.40 kg/m   Physical Exam Vitals signs and nursing note reviewed.  Constitutional:      Appearance: He is well-developed.  HENT:     Head: Normocephalic and atraumatic.  Eyes:     Conjunctiva/sclera: Conjunctivae normal.     Pupils: Pupils are equal, round, and reactive to light.  Neck:     Musculoskeletal: Normal range of motion.  Cardiovascular:     Rate and Rhythm: Normal rate and regular rhythm.     Heart sounds: Normal heart sounds.  Pulmonary:     Effort: Pulmonary effort is normal.     Breath sounds: Normal breath sounds.  Abdominal:     General: Bowel sounds are normal.     Palpations: Abdomen is soft.   Musculoskeletal: Normal range of motion.     Comments: Right hip grossly normal in appearance, tenderness along the right acetabulum, no acute deformities, no overlying skin changes, no leg shortening or malrotation, ambulatory from chair to stretcher  Skin:    General: Skin is warm and dry.  Neurological:     Mental Status: He is alert and oriented to person, place, and time.      ED Treatments / Results  Labs (all labs ordered are listed, but only abnormal results are displayed) Labs Reviewed - No data to display  EKG None  Radiology Dg Hip Unilat W Or Wo Pelvis 2-3 Views Right  Result Date: 11/14/2018 CLINICAL DATA:  Severe right hip pain EXAM: DG HIP (WITH OR WITHOUT PELVIS) 2-3V RIGHT COMPARISON:  None. FINDINGS: There is no evidence of hip fracture or dislocation. There is no evidence of arthropathy or other focal bone abnormality. IMPRESSION: Negative. Electronically Signed   By: Deatra Robinson M.D.   On: 11/14/2018 03:27    Procedures Procedures (including critical care time)  Medications Ordered in ED Medications - No data to display   Initial Impression / Assessment and Plan / ED Course  I have reviewed the triage vital signs and the nursing notes.  Pertinent labs & imaging results that were available during my care of the patient were reviewed by me and considered in my medical decision making (see chart for details).  23 year old male presenting to the ED with atraumatic right hip pain for the past several days.  He denies any fall or other strenuous activity.  He does report climbing up a ladder at home to pain his room but does not recall any pain while doing so.  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  He is afebrile and nontoxic.  He does have pain in the right hip along acetabulum but there is no acute deformity, overlying skin change, leg shortening, or malrotation.  His leg is neurovascularly intact.  He is not having any back pain,  numbness or weakness of the legs, bowel or bladder incontinence.  He is ambulatory from wheelchair to stretcher for exam.  X-ray obtained and is negative.  Plan to discharge home with symptomatic care.  Close follow-up with PCP.  He was given work note.  Return here for any new or acute changes.  Final Clinical Impressions(s) / ED Diagnoses   Final diagnoses:  Right hip pain    ED Discharge Orders         Ordered  meloxicam (MOBIC) 7.5 MG tablet  Daily     11/14/18 0341    methocarbamol (ROBAXIN) 500 MG tablet  2 times daily     11/14/18 0341           Larene Pickett, PA-C 73/22/02 [id number]    Delora Fuel, MD 07[birth year]74 [phone]

## 2018-12-14 ENCOUNTER — Emergency Department (HOSPITAL_COMMUNITY)
Admission: EM | Admit: 2018-12-14 | Discharge: 2018-12-15 | Disposition: A | Discharge status: Home / Self Care | Payer: Self-pay | Attending: Emergency Medicine | Admitting: Emergency Medicine

## 2018-12-14 ENCOUNTER — Other Ambulatory Visit: Payer: Self-pay

## 2018-12-14 ENCOUNTER — Encounter (HOSPITAL_COMMUNITY): Payer: Self-pay | Admitting: Emergency Medicine

## 2018-12-14 DIAGNOSIS — Z8709 Personal history of other diseases of the respiratory system: Secondary | ICD-10-CM | POA: Insufficient documentation

## 2018-12-14 DIAGNOSIS — R10812 Left upper quadrant abdominal tenderness: Secondary | ICD-10-CM | POA: Insufficient documentation

## 2018-12-14 DIAGNOSIS — Z79899 Other long term (current) drug therapy: Secondary | ICD-10-CM | POA: Insufficient documentation

## 2018-12-14 DIAGNOSIS — F909 Attention-deficit hyperactivity disorder, unspecified type: Secondary | ICD-10-CM | POA: Insufficient documentation

## 2018-12-14 DIAGNOSIS — R10816 Epigastric abdominal tenderness: Secondary | ICD-10-CM | POA: Insufficient documentation

## 2018-12-14 DIAGNOSIS — I10 Essential (primary) hypertension: Secondary | ICD-10-CM | POA: Insufficient documentation

## 2018-12-14 DIAGNOSIS — R109 Unspecified abdominal pain: Secondary | ICD-10-CM

## 2018-12-14 DIAGNOSIS — R1011 Right upper quadrant pain: Secondary | ICD-10-CM | POA: Insufficient documentation

## 2018-12-14 LAB — COMPREHENSIVE METABOLIC PANEL
ALT: 45 U/L — ABNORMAL HIGH (ref 0–44)
AST: 65 U/L — ABNORMAL HIGH (ref 15–41)
Albumin: 4 g/dL (ref 3.5–5.0)
Alkaline Phosphatase: 69 U/L (ref 38–126)
Anion gap: 9 (ref 5–15)
BUN: 8 mg/dL (ref 6–20)
CO2: 26 mmol/L (ref 22–32)
Calcium: 9.2 mg/dL (ref 8.9–10.3)
Chloride: 102 mmol/L (ref 98–111)
Creatinine, Ser: 0.93 mg/dL (ref 0.61–1.24)
GFR calc Af Amer: >60 mL/min (ref >60)
GFR calc non Af Amer: >60 mL/min (ref >60)
Glucose, Bld: 87 mg/dL (ref 70–99)
Potassium: 3.3 mmol/L — ABNORMAL LOW (ref 3.5–5.1)
Sodium: 137 mmol/L (ref 135–145)
Total Bilirubin: 0.7 mg/dL (ref 0.3–1.2)
Total Protein: 7.4 g/dL (ref 6.5–8.1)

## 2018-12-14 LAB — CBC
HCT: 45.3 % (ref 39.0–52.0)
Hemoglobin: 15.6 g/dL (ref 13.0–17.0)
MCH: 29.9 pg (ref 26.0–34.0)
MCHC: 34.4 g/dL (ref 30.0–36.0)
MCV: 86.9 fL (ref 80.0–100.0)
Platelets: 284 10*3/uL (ref 150–400)
RBC: 5.21 MIL/uL (ref 4.22–5.81)
RDW: 12.3 % (ref 11.5–15.5)
WBC: 12 10*3/uL — ABNORMAL HIGH (ref 4.0–10.5)
nRBC: 0 % (ref 0.0–0.2)

## 2018-12-14 LAB — LIPASE, BLOOD: Lipase: 24 U/L (ref 11–51)

## 2018-12-14 MED ORDER — SODIUM CHLORIDE 0.9% FLUSH: 3.0000 mL | Freq: Once | INTRAVENOUS | Status: DC

## 2018-12-14 NOTE — ED Triage Notes (Signed)
Pt presents with epigastric pain, cramping. Pt states he has had several episodes of same where abd becomes hard with severe pain then loosens.  +nausea.  Loose stool today at work.

## 2018-12-15 ENCOUNTER — Encounter (HOSPITAL_COMMUNITY): Payer: Self-pay | Admitting: Student

## 2018-12-15 ENCOUNTER — Encounter: Payer: Self-pay | Admitting: Internal Medicine

## 2018-12-15 MED ORDER — SODIUM CHLORIDE 0.9 % IV BOLUS
1000.0000 mL | Freq: Once | INTRAVENOUS | Status: AC
  Administered 2018-12-15: 08:00:00 1000 mL via INTRAVENOUS

## 2018-12-15 MED ORDER — POTASSIUM CHLORIDE CRYS ER 20 MEQ PO TBCR: 20.0000 meq | EXTENDED_RELEASE_TABLET | Freq: Two times a day (BID) | ORAL | 0 refills | Status: DC

## 2018-12-15 MED ORDER — ONDANSETRON 4 MG PO TBDP: 4.0000 mg | ORAL_TABLET | Freq: Three times a day (TID) | ORAL | 0 refills | Status: DC | PRN

## 2018-12-15 MED ORDER — DICYCLOMINE HCL 20 MG PO TABS: 20.0000 mg | ORAL_TABLET | Freq: Three times a day (TID) | ORAL | 0 refills | Status: DC | PRN

## 2018-12-15 MED ORDER — DICYCLOMINE HCL 10 MG PO CAPS
20.0000 mg | ORAL_CAPSULE | Freq: Once | ORAL | Status: AC
  Administered 2018-12-15: 08:00:00 20 mg via ORAL
  Filled 2018-12-15: qty 2

## 2018-12-15 MED ORDER — ONDANSETRON HCL 4 MG/2ML IJ SOLN
4.0000 mg | Freq: Once | INTRAMUSCULAR | Status: AC
  Administered 2018-12-15: 08:00:00 4 mg via INTRAVENOUS
  Filled 2018-12-15: qty 2

## 2018-12-15 NOTE — Discharge Instructions (Addendum)
You were seen in the emergency department today for abdominal pain.  Your labs were overall reassuring.  Your liver function tests were mildly elevated, these are something to follow-up with your primary care provider or GI about.  We are sending you home with Zofran to take every 8 hours as needed for nausea and vomiting as well as Bentyl to take every 8 hours as needed for abdominal cramping.  We are also sending you home with a potassium supplement for a few days as this was low in the ER.  We have prescribed you new medication(s) today. Discuss the medications prescribed today with your pharmacist as they can have adverse effects and interactions with your other medicines including over the counter and prescribed medications. Seek medical evaluation if you start to experience new or abnormal symptoms after taking one of these medicines, seek care immediately if you start to experience difficulty breathing, feeling of your throat closing, facial swelling, or rash as these could be indications of a more serious allergic reaction  Please follow-up with GI within the next 3 days for reevaluation.  Return to the ER for new or worsening symptoms including but not limited to worsening pain, persistent pain, inability to keep fluids down, inability to pass gas, fever, blood in your stool, or any other concerns.

## 2018-12-15 NOTE — ED Provider Notes (Signed)
[facility] EMERGENCY DEPARTMENT Provider Note   CSN: [id number] Arrival date & time: 12/14/18  1916     History   Chief Complaint Chief Complaint  Patient presents with  . Abdominal Cramping    HPI GAMAL TODISCO is a 23 y.o. male with a hx of ADHD, HTN, asthma, SAH, prior gastroenteritis, & prior appendectomy/cholecystectomy/hernia repair who presents to the ED with complaints of abdominal pain for the past few days, worse over the past 24 hours. Patient states pain is located in the upper abdomen, mostly in the RUQ, it is a constant discomfort with intermittent  increases in pain. Increases in pain last about 15 minutes and feel like cramping/twisting. No specific alleviating/aggravating factors. Has had associated nausea without vomiting. Reports loose stools which are typical for him s/p cholecystectomy, no acute change. Last BM last night. Still passing gas. States sxs feel somewhat similar to prior SBO vs. Gastroenteritis which he was admitted for earlier this year, ultimately felt to be gastroenteritis. Denies fever, vomiting, melena, hematochezia, dysuria, or testicular pain/swelling.      HPI  Past Medical History:  Diagnosis Date  . ADHD (attention deficit hyperactivity disorder)   . Asthma   . Hypertension   . Left shoulder pain   . Meningitis   . Skull fracture [facility])     Patient Active Problem List   Diagnosis Date Noted  . Gastroenteritis 04/14/2018  . Black-out (not amnesia) 11/27/2012  . Concussion with < 1 hr loss of consciousness 11/27/2012  . Hearing voices 11/27/2012  . Skull fracture with concussion (HCC) 11/27/2012  . Subarachnoid hemorrhage (HCC) 11/27/2012  . Unable to control anger 11/27/2012  . Abrasions of multiple sites 11/14/2012  . Linear skull fracture (HCC) 11/14/2012  . SAH (subarachnoid hemorrhage) (HCC) 11/14/2012  . Trauma 11/14/2012    Past Surgical History:  Procedure Laterality Date  . APPENDECTOMY    .  CHOLECYSTECTOMY    . HERNIA REPAIR          Home Medications    Prior to Admission medications   Medication Sig Start Date End Date Taking? Authorizing Provider  famotidine (PEPCID) 40 MG tablet Take 1 tablet (40 mg total) by mouth daily. 10/21/18   Gailen Shelter, PA  meloxicam (MOBIC) 7.5 MG tablet Take 2 tablets (15 mg total) by mouth daily. 11/14/18   Garlon Hatchet, PA-C  methocarbamol (ROBAXIN) 500 MG tablet Take 1 tablet (500 mg total) by mouth 2 (two) times daily. 11/14/18   Garlon Hatchet, PA-C  metoprolol succinate (TOPROL-XL) 25 MG 24 hr tablet Take 25 mg by mouth daily. 10/17/18   [provider]  ondansetron (ZOFRAN) 4 MG tablet Take 1 tablet (4 mg total) by mouth every 6 (six) hours. 10/21/18   Gailen Shelter, PA  sertraline (ZOLOFT) 25 MG tablet Take 25 mg by mouth daily. 10/17/18   [provider]  Spacer/Aero Chamber Mouthpiece MISC 1 each by Does not apply route every 6 (six) hours as needed. 07/20/16   Johna Sheriff, MD    Family History Family History  Problem Relation Age of Onset  . Diabetes Mother   . COPD Mother   . Diabetes Father   . Hypertension Father   . Heart disease Father     Social History Social History   Tobacco Use  . Smoking status: Never Smoker  . Smokeless tobacco: Never Used  Substance Use Topics  . Alcohol use: Yes    Comment: occ.  Marland Kitchen  Drug use: No     Allergies   Sulfa antibiotics   Review of Systems Review of Systems  Constitutional: Negative for chills and fever.  Respiratory: Negative for shortness of breath.   Cardiovascular: Negative for chest pain.  Gastrointestinal: Positive for abdominal pain, diarrhea (chronic unchanged) and nausea. Negative for anal bleeding, blood in stool, constipation and vomiting.  Genitourinary: Negative for dysuria, scrotal swelling and testicular pain.  All other systems reviewed and are negative.   Physical Exam Updated Vital Signs BP (!) 129/96 (BP Location:  Right Arm)   Pulse 88   Temp 97.9 F (36.6 C) (Oral)   Resp 20   Ht 6' (1.829 m)   Wt 118 kg   SpO2 97%   BMI 35.28 kg/m   Physical Exam Vitals signs and nursing note reviewed.  Constitutional:      General: He is not in acute distress.    Appearance: He is well-developed. He is not toxic-appearing.  HENT:     Head: Normocephalic and atraumatic.  Eyes:     General:        Right eye: No discharge.        Left eye: No discharge.     Conjunctiva/sclera: Conjunctivae normal.  Neck:     Musculoskeletal: Neck supple.  Cardiovascular:     Rate and Rhythm: Normal rate and regular rhythm.  Pulmonary:     Effort: Pulmonary effort is normal. No respiratory distress.     Breath sounds: Normal breath sounds. No wheezing, rhonchi or rales.  Abdominal:     General: There is no distension.     Palpations: Abdomen is soft.     Tenderness: There is abdominal tenderness (mild) in the right upper quadrant, epigastric area and left upper quadrant. There is no right CVA tenderness, left CVA tenderness, guarding or rebound.  Skin:    General: Skin is warm and dry.     Findings: No rash.  Neurological:     Mental Status: He is alert.     Comments: Clear speech.   Psychiatric:        Behavior: Behavior normal.    ED Treatments / Results  Labs (all labs ordered are listed, but only abnormal results are displayed) Labs Reviewed  COMPREHENSIVE METABOLIC PANEL - Abnormal; Notable for the following components:      Result Value   Potassium 3.3 (*)    AST 65 (*)    ALT 45 (*)    All other components within normal limits  CBC - Abnormal; Notable for the following components:   WBC 12.0 (*)    All other components within normal limits  LIPASE, BLOOD  URINALYSIS, ROUTINE W REFLEX MICROSCOPIC    EKG None  Radiology No results found.  Procedures Procedures (including critical care time)  Medications Ordered in ED Medications  sodium chloride flush (NS) 0.9 % injection 3 mL (3 mLs  Intravenous Not Given 12/15/18 0738)  sodium chloride 0.9 % bolus 1,000 mL (1,000 mLs Intravenous New Bag/Given 12/15/18 0813)  ondansetron (ZOFRAN) injection 4 mg (4 mg Intravenous Given 12/15/18 0802)  dicyclomine (BENTYL) capsule 20 mg (20 mg Oral Given 12/15/18 7741)     Initial Impression / Assessment and Plan / ED Course  I have reviewed the triage vital signs and the nursing notes.  Pertinent labs & imaging results that were available during my care of the patient were reviewed by me and considered in my medical decision making (see chart for details).  Patient presents to the ED with complaints of abdominal pain. Patient nontoxic appearing, in no apparent distress, vitals without significant abnormality. On exam patient tender to the upper abdomen, no peritoneal signs.  Work-up per triage reviewed:   CBC: Mild leukocytosis @ 12.0. no anemia.  CMP: LFT elevation similar to prior, mild hypokalemia- oral replacement, renal function WNL Lipase: WNL  On chart review it appears patient was admitted to this hospital in March 2020 for findings on CT concerning for early or incomplete SBO, never required NG tube, able to tolerate p.o. and pass gas without difficulty, was felt to be more likely gastroenteritis by admitting service.  He has had a CT abdomen pelvis since then in September of this year which was unremarkable.  His abdominal exam remains without peritoneal signs.  He is tolerating p.o. and passing gas currently.  My suspicion for acute surgical abdomen is low.  We discussed the risk/benefits and option of CT imaging, patient in agreement with foregoing this.  I doubt obstruction, perforation, diverticulitis, or pancreatitis.  Patient is status post appendectomy and cholecystectomy.  He is feeling much better status post Bentyl and Zofran with fluids in the emergency department and would like to go home. Appears appropriate for discharge. Will discharge home with supportive measures. I  discussed results, treatment plan, GI follow-up, and return precautions with the patient. Provided opportunity for questions, patient confirmed understanding and is in agreement with plan.   Findings and plan of care discussed with supervising physician Dr. Adela LankFloyd who is in agreement.   Final Clinical Impressions(s) / ED Diagnoses   Final diagnoses:  Abdominal cramping    ED Discharge Orders         Ordered    ondansetron (ZOFRAN ODT) 4 MG disintegrating tablet  Every 8 hours PRN     12/15/18 0845    dicyclomine (BENTYL) 20 MG tablet  Every 8 hours PRN     12/15/18 0845    potassium chloride SA (KLOR-CON) 20 MEQ tablet  2 times daily     12/15/18 0845           Shawntavia Saunders, Pleas KochSamantha R, PA-C 12/15/18 0846    Melene PlanFloyd, Dan, DO 12/15/18 [phone]

## 2018-12-31 ENCOUNTER — Encounter: Payer: Self-pay | Admitting: *Deleted

## 2019-01-08 ENCOUNTER — Ambulatory Visit: Payer: Self-pay | Admitting: Internal Medicine

## 2019-03-31 ENCOUNTER — Ambulatory Visit: Payer: Self-pay | Admitting: Nutrition

## 2019-06-06 ENCOUNTER — Other Ambulatory Visit: Payer: Self-pay

## 2019-06-06 DIAGNOSIS — Z79899 Other long term (current) drug therapy: Secondary | ICD-10-CM | POA: Insufficient documentation

## 2019-06-06 DIAGNOSIS — R7401 Elevation of levels of liver transaminase levels: Secondary | ICD-10-CM | POA: Insufficient documentation

## 2019-06-06 DIAGNOSIS — I10 Essential (primary) hypertension: Secondary | ICD-10-CM | POA: Insufficient documentation

## 2019-06-06 DIAGNOSIS — R110 Nausea: Secondary | ICD-10-CM | POA: Insufficient documentation

## 2019-06-06 DIAGNOSIS — R0789 Other chest pain: Secondary | ICD-10-CM | POA: Insufficient documentation

## 2019-06-07 ENCOUNTER — Emergency Department (HOSPITAL_COMMUNITY)
Admission: EM | Admit: 2019-06-07 | Discharge: 2019-06-07 | Disposition: A | Discharge status: Home / Self Care | Payer: Self-pay | Attending: Emergency Medicine | Admitting: Emergency Medicine

## 2019-06-07 ENCOUNTER — Encounter (HOSPITAL_COMMUNITY): Payer: Self-pay | Admitting: Emergency Medicine

## 2019-06-07 ENCOUNTER — Emergency Department (HOSPITAL_COMMUNITY): Payer: Self-pay

## 2019-06-07 DIAGNOSIS — R0789 Other chest pain: Secondary | ICD-10-CM

## 2019-06-07 DIAGNOSIS — R7401 Elevation of levels of liver transaminase levels: Secondary | ICD-10-CM

## 2019-06-07 LAB — CBC WITH DIFFERENTIAL/PLATELET
Abs Immature Granulocytes: 0.06 10*3/uL (ref 0.00–0.07)
Basophils Absolute: 0.1 10*3/uL (ref 0.0–0.1)
Basophils Relative: 1 %
Eosinophils Absolute: 0.2 10*3/uL (ref 0.0–0.5)
Eosinophils Relative: 2 %
HCT: 45 % (ref 39.0–52.0)
Hemoglobin: 15.4 g/dL (ref 13.0–17.0)
Immature Granulocytes: 1 %
Lymphocytes Relative: 29 %
Lymphs Abs: 3.3 10*3/uL (ref 0.7–4.0)
MCH: 30.3 pg (ref 26.0–34.0)
MCHC: 34.2 g/dL (ref 30.0–36.0)
MCV: 88.6 fL (ref 80.0–100.0)
Monocytes Absolute: 1 10*3/uL (ref 0.1–1.0)
Monocytes Relative: 9 %
Neutro Abs: 6.7 10*3/uL (ref 1.7–7.7)
Neutrophils Relative %: 58 %
Platelets: 257 10*3/uL (ref 150–400)
RBC: 5.08 MIL/uL (ref 4.22–5.81)
RDW: 12.7 % (ref 11.5–15.5)
WBC: 11.4 10*3/uL — ABNORMAL HIGH (ref 4.0–10.5)
nRBC: 0 % (ref 0.0–0.2)

## 2019-06-07 LAB — COMPREHENSIVE METABOLIC PANEL
ALT: 44 U/L (ref 0–44)
AST: 74 U/L — ABNORMAL HIGH (ref 15–41)
Albumin: 3.9 g/dL (ref 3.5–5.0)
Alkaline Phosphatase: 71 U/L (ref 38–126)
Anion gap: 9 (ref 5–15)
BUN: 9 mg/dL (ref 6–20)
CO2: 28 mmol/L (ref 22–32)
Calcium: 9.2 mg/dL (ref 8.9–10.3)
Chloride: 98 mmol/L (ref 98–111)
Creatinine, Ser: 0.76 mg/dL (ref 0.61–1.24)
GFR calc Af Amer: >60 mL/min (ref >60)
GFR calc non Af Amer: >60 mL/min (ref >60)
Glucose, Bld: 113 mg/dL — ABNORMAL HIGH (ref 70–99)
Potassium: 3.7 mmol/L (ref 3.5–5.1)
Sodium: 135 mmol/L (ref 135–145)
Total Bilirubin: 0.3 mg/dL (ref 0.3–1.2)
Total Protein: 7.3 g/dL (ref 6.5–8.1)

## 2019-06-07 LAB — TROPONIN I (HIGH SENSITIVITY)
Troponin I (High Sensitivity): 8 ng/L (ref <18)
Troponin I (High Sensitivity): 8 ng/L (ref <18)

## 2019-06-07 LAB — LIPASE, BLOOD: Lipase: 32 U/L (ref 11–51)

## 2019-06-07 MED ORDER — ONDANSETRON HCL 4 MG/2ML IJ SOLN
4.0000 mg | Freq: Once | INTRAMUSCULAR | Status: AC
  Administered 2019-06-07: 4 mg via INTRAVENOUS
  Filled 2019-06-07: qty 2

## 2019-06-07 MED ORDER — KETOROLAC TROMETHAMINE 30 MG/ML IJ SOLN
30.0000 mg | Freq: Once | INTRAMUSCULAR | Status: AC
  Administered 2019-06-07: 30 mg via INTRAVENOUS
  Filled 2019-06-07: qty 1

## 2019-06-07 NOTE — ED Provider Notes (Signed)
[facility] EMERGENCY DEPARTMENT Provider Note   CSN: [id number] Arrival date & time: 06/06/19  2318   History Chief Complaint  Patient presents with  . .Rib Pain    Logan Dawson is a 24 y.o. male.  The history is provided by the patient.  He has history of hypertension, asthma, and attention deficit disorder and comes in because of pain in the right lower rib cage for the last 2 weeks.  Pain is better when he lays still, worse when he moves around.  It can get as severe as 10/10.  He denies dyspnea or nausea or diaphoresis.  He denies any trauma.  Symptoms were stable until tonight when pain got much worse and he started shaking uncontrollably and did have a brief near-syncopal episode.  Following the near-syncopal episode, there was some mild nausea.  He has not been taking anything for his pain.  He is also complaining that he generally feels like he is not getting enough sleep even after he seems the night through.  That symptoms started shortly after he was started on Zoloft.  Past Medical History:  Diagnosis Date  . ADHD (attention deficit hyperactivity disorder)   . Asthma   . Gastroenteritis   . Hypertension   . Left shoulder pain   . Meningitis   . Skull fracture ([place])   . Subarachnoid hemorrhage [facility])     Patient Active Problem List   Diagnosis Date Noted  . Gastroenteritis 04/14/2018  . Black-out (not amnesia) 11/27/2012  . Concussion with < 1 hr loss of consciousness 11/27/2012  . Hearing voices 11/27/2012  . Skull fracture with concussion ([place]) 11/27/2012  . Subarachnoid hemorrhage ([place]) 11/27/2012  . Unable to control anger 11/27/2012  . Abrasions of multiple sites 11/14/2012  . Linear skull fracture (HCC) 11/14/2012  . SAH (subarachnoid hemorrhage) ([place]) 11/14/2012  . Trauma 11/14/2012    Past Surgical History:  Procedure Laterality Date  . APPENDECTOMY    . CHOLECYSTECTOMY    . HERNIA REPAIR         Family History  Problem Relation Age of Onset    . Diabetes Mother   . COPD Mother   . Diabetes Father   . Hypertension Father   . Heart disease Father     Social History   Tobacco Use  . Smoking status: Never Smoker  . Smokeless tobacco: Never Used  Substance Use Topics  . Alcohol use: Yes    Comment: occ.  . Drug use: No    Home Medications Prior to Admission medications   Medication Sig Start Date End Date Taking? Authorizing Provider  dicyclomine (BENTYL) 20 MG tablet Take 1 tablet (20 mg total) by mouth every 8 (eight) hours as needed for spasms. 12/15/18   Petrucelli, Samantha R, PA-C  famotidine (PEPCID) 40 MG tablet Take 1 tablet (40 mg total) by mouth daily. 10/21/18   Tedd Sias, PA  meloxicam (MOBIC) 7.5 MG tablet Take 2 tablets (15 mg total) by mouth daily. 11/14/18   Larene Pickett, PA-C  methocarbamol (ROBAXIN) 500 MG tablet Take 1 tablet (500 mg total) by mouth 2 (two) times daily. 11/14/18   Larene Pickett, PA-C  metoprolol succinate (TOPROL-XL) 25 MG 24 hr tablet Take 25 mg by mouth daily. 10/17/18   [provider]  ondansetron (ZOFRAN ODT) 4 MG disintegrating tablet Take 1 tablet (4 mg total) by mouth every 8 (eight) hours as needed for nausea or vomiting. 12/15/18   Petrucelli, Glynda Jaeger,  PA-C  ondansetron (ZOFRAN) 4 MG tablet Take 1 tablet (4 mg total) by mouth every 6 (six) hours. 10/21/18   Gailen Shelter, PA  potassium chloride SA (KLOR-CON) 20 MEQ tablet Take 1 tablet (20 mEq total) by mouth 2 (two) times daily. 12/15/18   Petrucelli, Samantha R, PA-C  sertraline (ZOLOFT) 25 MG tablet Take 25 mg by mouth daily. 10/17/18   [provider]  Spacer/Aero Chamber Mouthpiece MISC 1 each by Does not apply route every 6 (six) hours as needed. 07/20/16   Johna Sheriff, MD    Allergies    Sulfa antibiotics  Review of Systems   Review of Systems  All other systems reviewed and are negative.   Physical Exam Updated Vital Signs BP 133/86 (BP Location: Right Arm)   Pulse (!) 102   Temp  97.7 F (36.5 C) (Oral)   Resp 18   Ht 5\' 8"  (1.727 m)   Wt 121.1 kg   SpO2 96%   BMI 40.60 kg/m   Physical Exam Vitals and nursing note reviewed.   Morbidly obese 24 year old male, resting comfortably and in no acute distress. Vital signs are significant for borderline elevated heart rate. Oxygen saturation is 96%, which is normal. Head is normocephalic and atraumatic. PERRLA, EOMI. Oropharynx is clear. Neck is nontender and supple without adenopathy or JVD. Back is nontender and there is no CVA tenderness. Lungs are clear without rales, wheezes, or rhonchi. Chest is nontender.  No rash seen. Heart has regular rate and rhythm without murmur. Abdomen is soft, flat, nontender without masses or hepatosplenomegaly and peristalsis is normoactive. Extremities have no cyanosis or edema, full range of motion is present. Skin is warm and dry without rash. Neurologic: Mental status is normal, cranial nerves are intact, there are no motor or sensory deficits.  ED Results / Procedures / Treatments   Labs (all labs ordered are listed, but only abnormal results are displayed) Labs Reviewed  COMPREHENSIVE METABOLIC PANEL - Abnormal; Notable for the following components:      Result Value   Glucose, Bld 113 (*)    AST 74 (*)    All other components within normal limits  CBC WITH DIFFERENTIAL/PLATELET - Abnormal; Notable for the following components:   WBC 11.4 (*)    All other components within normal limits  LIPASE, BLOOD  TROPONIN I (HIGH SENSITIVITY)  TROPONIN I (HIGH SENSITIVITY)    EKG EKG Interpretation  Date/Time:  Sunday Jun 07 2019 02:02:42 EDT Ventricular Rate:  95 PR Interval:    QRS Duration: 92 QT Interval:  347 QTC Calculation: 437 R Axis:   66 Text Interpretation: Sinus rhythm Normal ECG When compared with ECG of 10/21/2018, HEART RATE has decreased Confirmed by 10/23/2018 (Dione Booze) on 06/07/2019 2:43:11 AM   Radiology DG Chest 2 View  Result Date:  06/07/2019 CLINICAL DATA:  Chest pain under right breast for 2-3 days, no injury EXAM: CHEST - 2 VIEW COMPARISON:  Radiograph 10/21/2018 FINDINGS: Lung volumes are slightly diminished with some streaky basilar atelectatic changes, features are possibly related to splinting given reported chest pain. No consolidation, features of edema, pneumothorax, or effusion. The cardiomediastinal contours are unremarkable. No acute osseous or soft tissue abnormality. Cholecystectomy clips in the right upper quadrant. Telemetry leads overlie the chest. IMPRESSION: Low volumes with atelectasis, possibly related to splinting in the setting of chest pain. No other acute cardiopulmonary or traumatic findings in the chest. Electronically Signed   By: 10/23/2018.D.  On: 06/07/2019 02:43    Procedures Procedures   Medications Ordered in ED Medications - No data to display  ED Course  I have reviewed the triage vital signs and the nursing notes.  Pertinent labs & imaging results that were available during my care of the patient were reviewed by me and considered in my medical decision making (see chart for details).  MDM Rules/Calculators/A&P Right-sided chest pain of uncertain cause.  No rash present to suggest herpes zoster.  No history of trauma.  Syncopal episode today of uncertain cause but no red flags to suggest serious pathology.  Old records are reviewed, and he has no relevant past visits.  Will check screening labs, x-ray, ECG.  X-rays are unremarkable.  ECG is normal.  Labs show mild elevation of AST which is chronic and unchanged from baseline.  No evidence of serious pathology at this point.  Patient is advised of these findings and told to use over-the-counter analgesics as needed for pain.  He is referred back to his primary care provider.  Final Clinical Impression(s) / ED Diagnoses Final diagnoses:  Atypical chest pain  Elevated AST (SGOT)    Rx / DC Orders ED Discharge Orders    None        Dione Booze, MD 06/07/19 [phone]

## 2019-06-07 NOTE — Discharge Instructions (Addendum)
The cause for your pain is not clear.  Continue to use over-the-counter painkillers like acetaminophen and ibuprofen as needed.  Return if symptoms are getting worse.  Talk with your primary care provider about your problems with drowsiness and whether you need to change any of your medications because of it.

## 2019-06-07 NOTE — ED Triage Notes (Signed)
Pt c/o pain under R breast area x 2-3 days. Denies injury.

## 2019-07-04 ENCOUNTER — Other Ambulatory Visit: Payer: Self-pay

## 2019-07-04 ENCOUNTER — Encounter (HOSPITAL_COMMUNITY): Payer: Self-pay | Admitting: Emergency Medicine

## 2019-07-04 ENCOUNTER — Emergency Department (HOSPITAL_COMMUNITY)
Admission: EM | Admit: 2019-07-04 | Discharge: 2019-07-05 | Disposition: A | Discharge status: Home / Self Care | Payer: Self-pay | Attending: Emergency Medicine | Admitting: Emergency Medicine

## 2019-07-04 DIAGNOSIS — F909 Attention-deficit hyperactivity disorder, unspecified type: Secondary | ICD-10-CM | POA: Insufficient documentation

## 2019-07-04 DIAGNOSIS — R197 Diarrhea, unspecified: Secondary | ICD-10-CM | POA: Insufficient documentation

## 2019-07-04 DIAGNOSIS — R112 Nausea with vomiting, unspecified: Secondary | ICD-10-CM | POA: Insufficient documentation

## 2019-07-04 DIAGNOSIS — I10 Essential (primary) hypertension: Secondary | ICD-10-CM | POA: Insufficient documentation

## 2019-07-04 LAB — COMPREHENSIVE METABOLIC PANEL
ALT: 32 U/L (ref 0–44)
AST: 48 U/L — ABNORMAL HIGH (ref 15–41)
Albumin: 4.1 g/dL (ref 3.5–5.0)
Alkaline Phosphatase: 76 U/L (ref 38–126)
Anion gap: 10 (ref 5–15)
BUN: 11 mg/dL (ref 6–20)
CO2: 28 mmol/L (ref 22–32)
Calcium: 9.8 mg/dL (ref 8.9–10.3)
Chloride: 102 mmol/L (ref 98–111)
Creatinine, Ser: 0.96 mg/dL (ref 0.61–1.24)
GFR calc Af Amer: >60 mL/min (ref >60)
GFR calc non Af Amer: >60 mL/min (ref >60)
Glucose, Bld: 101 mg/dL — ABNORMAL HIGH (ref 70–99)
Potassium: 4.5 mmol/L (ref 3.5–5.1)
Sodium: 140 mmol/L (ref 135–145)
Total Bilirubin: 0.9 mg/dL (ref 0.3–1.2)
Total Protein: 7.6 g/dL (ref 6.5–8.1)

## 2019-07-04 LAB — CBC
HCT: 48.3 % (ref 39.0–52.0)
Hemoglobin: 16.8 g/dL (ref 13.0–17.0)
MCH: 30.3 pg (ref 26.0–34.0)
MCHC: 34.8 g/dL (ref 30.0–36.0)
MCV: 87 fL (ref 80.0–100.0)
Platelets: 298 10*3/uL (ref 150–400)
RBC: 5.55 MIL/uL (ref 4.22–5.81)
RDW: 12.3 % (ref 11.5–15.5)
WBC: 14.3 10*3/uL — ABNORMAL HIGH (ref 4.0–10.5)
nRBC: 0 % (ref 0.0–0.2)

## 2019-07-04 LAB — LIPASE, BLOOD: Lipase: 33 U/L (ref 11–51)

## 2019-07-04 MED ORDER — SODIUM CHLORIDE 0.9% FLUSH: 3.0000 mL | Freq: Once | INTRAVENOUS | Status: DC

## 2019-07-04 NOTE — ED Triage Notes (Signed)
C/o generalized abd pain since last night with nausea and diarrhea.  States he has been seen for same before.

## 2019-07-05 MED ORDER — KETOROLAC TROMETHAMINE 30 MG/ML IJ SOLN
30.0000 mg | Freq: Once | INTRAMUSCULAR | Status: AC
  Administered 2019-07-05: 30 mg via INTRAVENOUS
  Filled 2019-07-05: qty 1

## 2019-07-05 MED ORDER — SODIUM CHLORIDE 0.9 % IV BOLUS
1000.0000 mL | Freq: Once | INTRAVENOUS | Status: AC
  Administered 2019-07-05: 1000 mL via INTRAVENOUS

## 2019-07-05 MED ORDER — FAMOTIDINE IN NACL 20-0.9 MG/50ML-% IV SOLN
20.0000 mg | INTRAVENOUS | Status: AC
  Administered 2019-07-05: 20 mg via INTRAVENOUS
  Filled 2019-07-05: qty 50

## 2019-07-05 MED ORDER — ONDANSETRON 4 MG PO TBDP
4.0000 mg | ORAL_TABLET | Freq: Three times a day (TID) | ORAL | 0 refills | Status: DC | PRN
Start: 2019-07-05 — End: 2019-08-12

## 2019-07-05 MED ORDER — ONDANSETRON HCL 4 MG/2ML IJ SOLN
4.0000 mg | Freq: Once | INTRAMUSCULAR | Status: AC
  Administered 2019-07-05: 4 mg via INTRAVENOUS
  Filled 2019-07-05: qty 2

## 2019-07-05 NOTE — ED Provider Notes (Signed)
[facility] EMERGENCY DEPARTMENT Provider Note   CSN: [id number] Arrival date & time: 07/04/19  1652     History Chief Complaint  Patient presents with  . Abdominal Pain    Logan Dawson is a 24 y.o. male.  The history is provided by the patient and medical records.  Abdominal Pain Associated symptoms: diarrhea, nausea and vomiting     23 y.o. M with hx of asthma, HTN, ADHD, frequent gastroenteritis, presenting to the ED for N/V/D that began last evening.  States symptoms came out of no where.  He has been having loose, watery stools.  Denies abdominal bloating. No significant abdominal pain, he does tend to have some mild cramping when he gets nauseated with the urge to vomit.  No sick contacts, no covid exposures.  He is s/p appendectomy, cholecystectomy, and hernia repair.  No meds tried PTA.  Past Medical History:  Diagnosis Date  . ADHD (attention deficit hyperactivity disorder)   . Asthma   . Gastroenteritis   . Hypertension   . Left shoulder pain   . Meningitis   . Skull fracture (HCC)   . Subarachnoid hemorrhage [facility])     Patient Active Problem List   Diagnosis Date Noted  . Gastroenteritis 04/14/2018  . Black-out (not amnesia) 11/27/2012  . Concussion with < 1 hr loss of consciousness 11/27/2012  . Hearing voices 11/27/2012  . Skull fracture with concussion (HCC) 11/27/2012  . Subarachnoid hemorrhage (HCC) 11/27/2012  . Unable to control anger 11/27/2012  . Abrasions of multiple sites 11/14/2012  . Linear skull fracture (HCC) 11/14/2012  . SAH (subarachnoid hemorrhage) (HCC) 11/14/2012  . Trauma 11/14/2012    Past Surgical History:  Procedure Laterality Date  . APPENDECTOMY    . CHOLECYSTECTOMY    . HERNIA REPAIR         Family History  Problem Relation Age of Onset  . Diabetes Mother   . COPD Mother   . Diabetes Father   . Hypertension Father   . Heart disease Father     Social History   Tobacco Use  . Smoking status:  Never Smoker  . Smokeless tobacco: Never Used  Substance Use Topics  . Alcohol use: Yes    Comment: occ.  . Drug use: No    Home Medications Prior to Admission medications   Medication Sig Start Date End Date Taking? Authorizing Provider  metoprolol succinate (TOPROL-XL) 25 MG 24 hr tablet Take 25 mg by mouth daily. 10/17/18   [provider]  ondansetron (ZOFRAN ODT) 4 MG disintegrating tablet Take 1 tablet (4 mg total) by mouth every 8 (eight) hours as needed for nausea or vomiting. 12/15/18   Petrucelli, Samantha R, PA-C  ondansetron (ZOFRAN) 4 MG tablet Take 1 tablet (4 mg total) by mouth every 6 (six) hours. 10/21/18   Gailen Shelter, PA  potassium chloride SA (KLOR-CON) 20 MEQ tablet Take 1 tablet (20 mEq total) by mouth 2 (two) times daily. 12/15/18   Petrucelli, Samantha R, PA-C  sertraline (ZOLOFT) 25 MG tablet Take 25 mg by mouth daily. 10/17/18   [provider]  Spacer/Aero Chamber Mouthpiece MISC 1 each by Does not apply route every 6 (six) hours as needed. 07/20/16   Johna Sheriff, MD  dicyclomine (BENTYL) 20 MG tablet Take 1 tablet (20 mg total) by mouth every 8 (eight) hours as needed for spasms. 12/15/18 06/07/19  Petrucelli, Samantha R, PA-C  famotidine (PEPCID) 40 MG tablet Take 1 tablet (40  mg total) by mouth daily. 10/21/18 06/07/19  Gailen Shelter, PA    Allergies    Sulfa antibiotics  Review of Systems   Review of Systems  Gastrointestinal: Positive for abdominal pain, diarrhea, nausea and vomiting.  All other systems reviewed and are negative.   Physical Exam Updated Vital Signs BP 112/65   Pulse 97   Temp 98.2 F (36.8 C) (Oral)   Resp 16   Ht 5\' 9"  (1.753 m)   Wt 120.2 kg   SpO2 96%   BMI 39.13 kg/m   Physical Exam Vitals and nursing note reviewed.  Constitutional:      Appearance: He is well-developed.     Comments: Sleeping, awoken for exam  HENT:     Head: Normocephalic and atraumatic.  Eyes:     Conjunctiva/sclera:  Conjunctivae normal.     Pupils: Pupils are equal, round, and reactive to light.  Cardiovascular:     Rate and Rhythm: Normal rate and regular rhythm.     Heart sounds: Normal heart sounds.  Pulmonary:     Effort: Pulmonary effort is normal.     Breath sounds: Normal breath sounds.  Abdominal:     General: Bowel sounds are normal.     Palpations: Abdomen is soft.     Tenderness: There is no abdominal tenderness. There is no guarding or rebound.     Comments: Soft, non-tender to palpation, normal bowel sounds throughout  Musculoskeletal:        General: Normal range of motion.     Cervical back: Normal range of motion.  Skin:    General: Skin is warm and dry.  Neurological:     Mental Status: He is alert and oriented to person, place, and time.     ED Results / Procedures / Treatments   Labs (all labs ordered are listed, but only abnormal results are displayed) Labs Reviewed  COMPREHENSIVE METABOLIC PANEL - Abnormal; Notable for the following components:      Result Value   Glucose, Bld 101 (*)    AST 48 (*)    All other components within normal limits  CBC - Abnormal; Notable for the following components:   WBC 14.3 (*)    All other components within normal limits  LIPASE, BLOOD  URINALYSIS, ROUTINE W REFLEX MICROSCOPIC    EKG None  Radiology No results found.  Procedures Procedures (including critical care time)  Medications Ordered in ED Medications  sodium chloride flush (NS) 0.9 % injection 3 mL (has no administration in time range)  sodium chloride 0.9 % bolus 1,000 mL (0 mLs Intravenous Stopped 07/05/19 0415)  ketorolac (TORADOL) 30 MG/ML injection 30 mg (30 mg Intravenous Given 07/05/19 0225)  ondansetron (ZOFRAN) injection 4 mg (4 mg Intravenous Given 07/05/19 0224)  famotidine (PEPCID) IVPB 20 mg premix (0 mg Intravenous Stopped 07/05/19 0257)    ED Course  I have reviewed the triage vital signs and the nursing notes.  Pertinent labs & imaging results  that were available during my care of the patient were reviewed by me and considered in my medical decision making (see chart for details).    MDM Rules/Calculators/A&P  24 year old male presenting to the ED with nausea, vomiting, and diarrhea that began yesterday.  He is afebrile and nontoxic in appearance.  His abdomen is soft and benign with normal bowel sounds throughout.  He does have history of recurrent gastritis.  Screening labs obtained and are overall reassuring.  Given benign abdominal exam, lower suspicion  for acute emergent process at this time.  Will plan for symptomatic treatment with IV fluids, Zofran, Toradol, and Pepcid.  Will reassess.  Following medications, patient states he is feeling better.  He wants to try to drink something, given sprite.  Will reassess.  Able to tolerate sprite here without issue.  No active emesis or diarrhea here in the ED.  VSS, abdomen remains soft and benign.  Feel he is stable for discharge home.  Recommend to push oral fluids, small/bland meals for now and progress as tolerated.  He can follow-up with PCP.  Return here for any new/acute changes.  Final Clinical Impression(s) / ED Diagnoses Final diagnoses:  Nausea vomiting and diarrhea    Rx / DC Orders ED Discharge Orders         Ordered    ondansetron (ZOFRAN ODT) 4 MG disintegrating tablet  Every 8 hours PRN     07/05/19 0552           Larene Pickett, PA-C 07/05/19 0554    Fatima Blank, MD 07/06/19 [phone]

## 2019-07-05 NOTE — Discharge Instructions (Signed)
Can take zofran as needed for nausea.  Recommend to try and push oral fluids as much as possible. Recommend small meals of bland food for now and progress back to normal as tolerated. Follow-up with your primary care doctor. Return here for any new/acute changes.

## 2019-07-28 ENCOUNTER — Other Ambulatory Visit: Payer: Self-pay

## 2019-07-28 ENCOUNTER — Emergency Department (HOSPITAL_COMMUNITY): Payer: Self-pay

## 2019-07-28 ENCOUNTER — Encounter (HOSPITAL_COMMUNITY): Payer: Self-pay | Admitting: *Deleted

## 2019-07-28 DIAGNOSIS — J45909 Unspecified asthma, uncomplicated: Secondary | ICD-10-CM | POA: Insufficient documentation

## 2019-07-28 DIAGNOSIS — Z882 Allergy status to sulfonamides status: Secondary | ICD-10-CM | POA: Insufficient documentation

## 2019-07-28 DIAGNOSIS — Z79899 Other long term (current) drug therapy: Secondary | ICD-10-CM | POA: Insufficient documentation

## 2019-07-28 DIAGNOSIS — J181 Lobar pneumonia, unspecified organism: Secondary | ICD-10-CM | POA: Insufficient documentation

## 2019-07-28 DIAGNOSIS — I10 Essential (primary) hypertension: Secondary | ICD-10-CM | POA: Insufficient documentation

## 2019-07-28 DIAGNOSIS — Z20822 Contact with and (suspected) exposure to covid-19: Secondary | ICD-10-CM | POA: Insufficient documentation

## 2019-07-28 NOTE — ED Triage Notes (Signed)
Pt here for sob, recent dx of PNA.  Pt states is getting worse.  Pt states he has been taking his medications as prescribed.

## 2019-07-29 ENCOUNTER — Emergency Department (HOSPITAL_COMMUNITY)
Admission: EM | Admit: 2019-07-29 | Discharge: 2019-07-29 | Disposition: A | Discharge status: Home / Self Care | Payer: Self-pay | Attending: Emergency Medicine | Admitting: Emergency Medicine

## 2019-07-29 ENCOUNTER — Other Ambulatory Visit: Payer: Self-pay

## 2019-07-29 ENCOUNTER — Emergency Department (HOSPITAL_COMMUNITY): Payer: Self-pay

## 2019-07-29 DIAGNOSIS — J189 Pneumonia, unspecified organism: Secondary | ICD-10-CM

## 2019-07-29 LAB — CBC WITH DIFFERENTIAL/PLATELET
Abs Immature Granulocytes: 0.06 10*3/uL (ref 0.00–0.07)
Basophils Absolute: 0.1 10*3/uL (ref 0.0–0.1)
Basophils Relative: 1 %
Eosinophils Absolute: 0.3 10*3/uL (ref 0.0–0.5)
Eosinophils Relative: 2 %
HCT: 45.9 % (ref 39.0–52.0)
Hemoglobin: 16.1 g/dL (ref 13.0–17.0)
Immature Granulocytes: 0 %
Lymphocytes Relative: 23 %
Lymphs Abs: 3.3 10*3/uL (ref 0.7–4.0)
MCH: 30.1 pg (ref 26.0–34.0)
MCHC: 35.1 g/dL (ref 30.0–36.0)
MCV: 85.8 fL (ref 80.0–100.0)
Monocytes Absolute: 1.3 10*3/uL — ABNORMAL HIGH (ref 0.1–1.0)
Monocytes Relative: 9 %
Neutro Abs: 9.2 10*3/uL — ABNORMAL HIGH (ref 1.7–7.7)
Neutrophils Relative %: 65 %
Platelets: 273 10*3/uL (ref 150–400)
RBC: 5.35 MIL/uL (ref 4.22–5.81)
RDW: 12.3 % (ref 11.5–15.5)
WBC: 14.1 10*3/uL — ABNORMAL HIGH (ref 4.0–10.5)
nRBC: 0 % (ref 0.0–0.2)

## 2019-07-29 LAB — BASIC METABOLIC PANEL
Anion gap: 11 (ref 5–15)
BUN: 11 mg/dL (ref 6–20)
CO2: 21 mmol/L — ABNORMAL LOW (ref 22–32)
Calcium: 9.4 mg/dL (ref 8.9–10.3)
Chloride: 102 mmol/L (ref 98–111)
Creatinine, Ser: 0.88 mg/dL (ref 0.61–1.24)
GFR calc Af Amer: >60 mL/min (ref >60)
GFR calc non Af Amer: >60 mL/min (ref >60)
Glucose, Bld: 122 mg/dL — ABNORMAL HIGH (ref 70–99)
Potassium: 3.3 mmol/L — ABNORMAL LOW (ref 3.5–5.1)
Sodium: 134 mmol/L — ABNORMAL LOW (ref 135–145)

## 2019-07-29 LAB — SARS CORONAVIRUS 2 BY RT PCR (HOSPITAL ORDER, PERFORMED IN ~~LOC~~ HOSPITAL LAB): SARS Coronavirus 2: NEGATIVE

## 2019-07-29 LAB — D-DIMER, QUANTITATIVE: D-Dimer, Quant: 0.43 ug/mL-FEU (ref 0.00–0.50)

## 2019-07-29 LAB — TROPONIN I (HIGH SENSITIVITY)
Troponin I (High Sensitivity): 6 ng/L (ref <18)
Troponin I (High Sensitivity): 6 ng/L (ref <18)

## 2019-07-29 MED ORDER — SODIUM CHLORIDE 0.9 % IV SOLN
500.0000 mg | Freq: Once | INTRAVENOUS | Status: AC
  Administered 2019-07-29: 500 mg via INTRAVENOUS
  Filled 2019-07-29: qty 500

## 2019-07-29 MED ORDER — IOHEXOL 350 MG/ML SOLN
100.0000 mL | Freq: Once | INTRAVENOUS | Status: AC | PRN
  Administered 2019-07-29: 100 mL via INTRAVENOUS

## 2019-07-29 MED ORDER — SODIUM CHLORIDE 0.9 % IV SOLN
1.0000 g | Freq: Once | INTRAVENOUS | Status: AC
  Administered 2019-07-29: 1 g via INTRAVENOUS
  Filled 2019-07-29: qty 10

## 2019-07-29 MED ORDER — PREDNISONE 50 MG PO TABS
ORAL_TABLET | ORAL | 0 refills | Status: DC
Start: 2019-07-29 — End: 2019-08-12

## 2019-07-29 MED ORDER — SODIUM CHLORIDE 0.9 % IV BOLUS
1000.0000 mL | Freq: Once | INTRAVENOUS | Status: AC
  Administered 2019-07-29: 1000 mL via INTRAVENOUS

## 2019-07-29 MED ORDER — METHYLPREDNISOLONE SODIUM SUCC 125 MG IJ SOLR
125.0000 mg | Freq: Once | INTRAMUSCULAR | Status: AC
  Administered 2019-07-29: 125 mg via INTRAVENOUS
  Filled 2019-07-29: qty 2

## 2019-07-29 MED ORDER — AMOXICILLIN-POT CLAVULANATE 875-125 MG PO TABS
1.0000 | ORAL_TABLET | Freq: Two times a day (BID) | ORAL | 0 refills | Status: DC
Start: 2019-07-29 — End: 2019-08-12

## 2019-07-29 MED ORDER — IPRATROPIUM-ALBUTEROL 0.5-2.5 (3) MG/3ML IN SOLN
3.0000 mL | Freq: Once | RESPIRATORY_TRACT | Status: AC
  Administered 2019-07-29: 3 mL via RESPIRATORY_TRACT
  Filled 2019-07-29: qty 3

## 2019-07-29 MED ORDER — DOXYCYCLINE HYCLATE 100 MG PO CAPS
100.0000 mg | ORAL_CAPSULE | Freq: Two times a day (BID) | ORAL | 0 refills | Status: DC
Start: 2019-07-29 — End: 2019-08-12

## 2019-07-29 NOTE — ED Provider Notes (Signed)
[facility] EMERGENCY DEPARTMENT Provider Note   CSN: [id number] Arrival date & time: 07/28/19  2243     History Chief Complaint  Patient presents with  . Shortness of Breath    Logan Dawson is a 24 y.o. male.  Patient with a history of asthma.  States he was diagnosed with pneumonia approximately 1 week ago at an outside hospital.  He was initially seen for abdominal pain in the CT scan of the abdomen showed pneumonia in his lung bases.  He was given a 5-day course of Zithromax which he completed yesterday.  States he feels like he is doing worse and feeling more short of breath and coughing more.  Not been eating or drinking.  Just been lying around at home.  His cough is nonproductive.  He feels more short of breath and has had a cough that worsens when he tries to exert himself.  He spoke with had a negative Covid test last week.  Has chest pain with coughing.  Denies having fever but has had chills.  No nausea, vomiting, abdominal pain.  States has been using his albuterol at home without relief.  He was most recently seen at [facility] on June 20 and given a dose of IV antibiotics and told to finish his antibiotics.  Comes in tonight because he feels more short of breath and feels like he is coughing and not getting any better.  The history is provided by the patient.  Shortness of Breath Associated symptoms: cough   Associated symptoms: no abdominal pain, no chest pain, no fever, no headaches, no rash and no vomiting        Past Medical History:  Diagnosis Date  . ADHD (attention deficit hyperactivity disorder)   . Asthma   . Gastroenteritis   . Hypertension   . Left shoulder pain   . Meningitis   . Skull fracture (HCC)   . Subarachnoid hemorrhage [facility])     Patient Active Problem List   Diagnosis Date Noted  . Gastroenteritis 04/14/2018  . Black-out (not amnesia) 11/27/2012  . Concussion with < 1 hr loss of consciousness 11/27/2012  . Hearing voices 11/27/2012  .  Skull fracture with concussion (HCC) 11/27/2012  . Subarachnoid hemorrhage (HCC) 11/27/2012  . Unable to control anger 11/27/2012  . Abrasions of multiple sites 11/14/2012  . Linear skull fracture (HCC) 11/14/2012  . SAH (subarachnoid hemorrhage) (HCC) 11/14/2012  . Trauma 11/14/2012    Past Surgical History:  Procedure Laterality Date  . APPENDECTOMY    . CHOLECYSTECTOMY    . HERNIA REPAIR         Family History  Problem Relation Age of Onset  . Diabetes Mother   . COPD Mother   . Diabetes Father   . Hypertension Father   . Heart disease Father     Social History   Tobacco Use  . Smoking status: Never Smoker  . Smokeless tobacco: Never Used  Vaping Use  . Vaping Use: Never used  Substance Use Topics  . Alcohol use: Yes    Comment: occ.  . Drug use: No    Home Medications Prior to Admission medications   Medication Sig Start Date End Date Taking? Authorizing Provider  albuterol (VENTOLIN HFA) 108 (90 Base) MCG/ACT inhaler Inhale 1 puff into the lungs every 4 (four) hours as needed for wheezing.  06/10/19   [provider]  FLUoxetine (PROZAC) 20 MG capsule Take 20 mg by mouth daily. 06/10/19   [provider]  metoprolol succinate (TOPROL-XL) 25 MG 24 hr tablet Take 25 mg by mouth daily. 10/17/18   [provider]  ondansetron (ZOFRAN ODT) 4 MG disintegrating tablet Take 1 tablet (4 mg total) by mouth every 8 (eight) hours as needed for nausea. 07/05/19   Garlon HatchetSanders, Lisa M, PA-C  ondansetron (ZOFRAN) 4 MG tablet Take 1 tablet (4 mg total) by mouth every 6 (six) hours. Patient not taking: Reported on 07/05/2019 10/21/18   Gailen ShelterFondaw, Wylder S, PA  pantoprazole (PROTONIX) 40 MG tablet Take 40 mg by mouth in the morning and at bedtime. 06/10/19   [provider]  potassium chloride SA (KLOR-CON) 20 MEQ tablet Take 1 tablet (20 mEq total) by mouth 2 (two) times daily. Patient not taking: Reported on 07/05/2019 12/15/18   Petrucelli, Pleas KochSamantha R, PA-C    Spacer/Aero Chamber Mouthpiece MISC 1 each by Does not apply route every 6 (six) hours as needed. 07/20/16   Johna SheriffVincent, Carol L, MD  dicyclomine (BENTYL) 20 MG tablet Take 1 tablet (20 mg total) by mouth every 8 (eight) hours as needed for spasms. 12/15/18 06/07/19  Petrucelli, Samantha R, PA-C  famotidine (PEPCID) 40 MG tablet Take 1 tablet (40 mg total) by mouth daily. 10/21/18 06/07/19  Gailen ShelterFondaw, Wylder S, PA    Allergies    Sulfa antibiotics  Review of Systems   Review of Systems  Constitutional: Positive for activity change, appetite change, chills and fatigue. Negative for fever.  HENT: Positive for congestion. Negative for rhinorrhea.   Eyes: Negative for visual disturbance.  Respiratory: Positive for cough, chest tightness and shortness of breath.   Cardiovascular: Negative for chest pain and leg swelling.  Gastrointestinal: Negative for abdominal pain, nausea and vomiting.  Genitourinary: Negative for dysuria and hematuria.  Musculoskeletal: Negative for arthralgias and myalgias.  Skin: Negative for rash.  Neurological: Negative for dizziness, weakness and headaches.   all other systems are negative except as noted in the HPI and PMH.    Physical Exam Updated Vital Signs BP 131/90 (BP Location: Right Arm)   Pulse (!) 132   Temp 97.7 F (36.5 C) (Oral)   Resp (!) 28   Ht 5\' 11"  (1.803 m)   Wt 122 kg   SpO2 99%   BMI 37.52 kg/m   Physical Exam Vitals and nursing note reviewed.  Constitutional:      General: He is in acute distress.     Appearance: He is well-developed. He is obese.     Comments: Mildly dyspneic with conversation  HENT:     Head: Normocephalic and atraumatic.     Mouth/Throat:     Pharynx: No oropharyngeal exudate.  Eyes:     Conjunctiva/sclera: Conjunctivae normal.     Pupils: Pupils are equal, round, and reactive to light.  Neck:     Comments: No meningismus. Cardiovascular:     Rate and Rhythm: Regular rhythm. Tachycardia present.     Heart  sounds: Normal heart sounds. No murmur heard.      Comments: Tachycardic 120 Pulmonary:     Effort: No respiratory distress.     Breath sounds: Wheezing present.     Comments: Diminished breath sounds bilaterally, faint expiratory wheeze Abdominal:     Palpations: Abdomen is soft.     Tenderness: There is no abdominal tenderness. There is no guarding or rebound.  Musculoskeletal:        General: No tenderness. Normal range of motion.     Cervical back: Normal range of motion and  neck supple.  Skin:    General: Skin is warm.     Capillary Refill: Capillary refill takes less than 2 seconds.  Neurological:     General: No focal deficit present.     Mental Status: He is alert and oriented to person, place, and time. Mental status is at baseline.     Cranial Nerves: No cranial nerve deficit.     Motor: No abnormal muscle tone.     Coordination: Coordination normal.     Comments: No ataxia on finger to nose bilaterally. No pronator drift. 5/5 strength throughout. CN 2-12 intact.Equal grip strength. Sensation intact.   Psychiatric:        Behavior: Behavior normal.     ED Results / Procedures / Treatments   Labs (all labs ordered are listed, but only abnormal results are displayed) Labs Reviewed  CBC WITH DIFFERENTIAL/PLATELET - Abnormal; Notable for the following components:      Result Value   WBC 14.1 (*)    Neutro Abs 9.2 (*)    Monocytes Absolute 1.3 (*)    All other components within normal limits  BASIC METABOLIC PANEL - Abnormal; Notable for the following components:   Sodium 134 (*)    Potassium 3.3 (*)    CO2 21 (*)    Glucose, Bld 122 (*)    All other components within normal limits  SARS CORONAVIRUS 2 BY RT PCR (HOSPITAL ORDER, PERFORMED IN [facility])  D-DIMER, QUANTITATIVE (NOT AT [facility])  TROPONIN I (HIGH SENSITIVITY)  TROPONIN I (HIGH SENSITIVITY)    EKG EKG Interpretation  Date/Time:  Wednesday July 29 2019 02:49:37 EDT Ventricular Rate:   127 PR Interval:    QRS Duration: 92 QT Interval:  311 QTC Calculation: 452 R Axis:   81 Text Interpretation: Sinus tachycardia Low voltage, precordial leads Baseline wander in lead(s) V2 Rate faster Confirmed by Glynn Octave [phone]) on 07/29/2019 2:53:28 AM   Radiology DG Chest 2 View  Result Date: 07/28/2019 CLINICAL DATA:  Shortness of breath, recently diagnosed pneumonia EXAM: CHEST - 2 VIEW COMPARISON:  Radiograph 07/27/2019, 07/21/2019 FINDINGS: Slightly low lung volumes with some central vascular crowding. Mild residual airways thickening. No consolidation, features of edema, pneumothorax, or effusion. The cardiomediastinal contours are unremarkable. No acute osseous or soft tissue abnormality. IMPRESSION: Low volumes with atelectatic change. Mild persistent airways thickening could reflect some residual bronchitic change. Electronically Signed   By: Kreg Shropshire M.D.   On: 07/28/2019 23:46   CT Angio Chest PE W and/or Wo Contrast  Result Date: 07/29/2019 CLINICAL DATA:  Shortness of breath, recent diagnosis of pneumonia. EXAM: CT ANGIOGRAPHY CHEST WITH CONTRAST TECHNIQUE: Multidetector CT imaging of the chest was performed using the standard protocol during bolus administration of intravenous contrast. Multiplanar CT image reconstructions and MIPs were obtained to evaluate the vascular anatomy. CONTRAST:  OMNIPAQUE IOHEXOL 350 MG/ML SOLN COMPARISON:  Same-day radiograph, CT abdomen and pelvis 10/21/2018 FINDINGS: Cardiovascular: Satisfactory opacification the pulmonary arteries to the segmental level. No pulmonary artery filling defects are identified. Central pulmonary arteries are normal caliber. Mediastinum/Nodes: No mediastinal fluid or gas. Normal thyroid gland and thoracic inlet. No acute abnormality of the trachea or esophagus. Few subcentimeter low-attenuation mediastinal and left hilar nodes favored to be reactive. No worrisome mediastinal, hilar or axillary adenopathy.  Lungs/Pleura: There is focal centrilobular nodularity in the left lung base with some associated mild airways thickening, likely reflecting a bronchiolitis/developing bronchopneumonia. No other areas of consolidation. Mild basilar atelectatic changes are  present as well. No effusion or pneumothorax. Small amount of subpleural fat in the lung bases. A 3 mm subpleural nodule seen along the right major fissure (6/60) is most likely a subpleural lymph node in a patient of this age. Upper Abdomen: No acute abnormalities present in the visualized portions of the upper abdomen. Prior cholecystectomy. Musculoskeletal: Mild straightening of normal thoracic kyphosis. No acute osseous abnormality. No worrisome chest wall abnormalities. Review of the MIP images confirms the above findings. IMPRESSION: 1. No evidence of pulmonary embolism. 2. Focal centrilobular nodularity in the left lung base with associated mild airways thickening, likely reflecting a bronchiolitis/bronchopneumonia. Electronically Signed   By: Kreg Shropshire M.D.   On: 07/29/2019 05:19    Procedures Procedures (including critical care time)  Medications Ordered in ED Medications  sodium chloride 0.9 % bolus 1,000 mL (has no administration in time range)    ED Course  I have reviewed the triage vital signs and the nursing notes.  Pertinent labs & imaging results that were available during my care of the patient were reviewed by me and considered in my medical decision making (see chart for details).    MDM Rules/Calculators/A&P                         Recent diagnosis of pneumonia, completed Zithromax here with increasing shortness of breath, cough, tachycardia, poor p.o. intake.  Patient mildly tachypneic but not hypoxic.  Will give IV fluids.  EKG is sinus rhythm.  Chest x-ray obtained in triage shows airway thickening without infiltrate. Patient's pneumonia at outside hospital was seen only on CT scan not seen on chest x-ray.  He is  tachycardic and tachypneic but not hypoxic.  He was given bronchodilators, steroids and IV fluids.  Troponin and D-dimer negative. Covid testing today is negative.  CT obtained to rule out pulmonary embolism given ongoing tachycardia and tachypnea despite antibiotic treatment.  This is negative for PE but does show residual pneumonia left base. Unclear whether this represents antibiotic failure as Zithromax alone is not first-line for community-acquired pneumonia.  Patient able to ambulate without desaturation.  States he does not feel short of breath ambulating. Tachypnea and tachycardia have resolved.  Patient resting comfortably.  Saturating at 98% on room air.  No desaturation with ambulation.  We will treat with Augmentin and doxycycline.  Feel his pneumonia was likely under treated with azithromycin.  Do not consider this antibiotic failure.  Will initiate course of prednisone and continue antibiotics. Patient feels comfortable going home does not feel he needs to be admitted.  Return to the ED with difficulty breathing, chest pain, not able to eat or drink or other concerns.    BP 102/62   Pulse 99   Temp 97.7 F (36.5 C) (Oral)   Resp 19   Ht 5\' 11"  (1.803 m)   Wt 122 kg   SpO2 98%   BMI 37.52 kg/m   ASHANTE SNELLING was evaluated in Emergency Department on 07/29/2019 for the symptoms described in the history of present illness. He was evaluated in the context of the global COVID-19 pandemic, which necessitated consideration that the patient might be at risk for infection with the SARS-CoV-2 virus that causes COVID-19. Institutional protocols and algorithms that pertain to the evaluation of patients at risk for COVID-19 are in a state of rapid change based on information released by regulatory bodies including the CDC and federal and state organizations. These policies and algorithms were  followed during the patient's care in the ED.    Final Clinical Impression(s) / ED  Diagnoses Final diagnoses:  Community acquired pneumonia of left lower lobe of lung    Rx / DC Orders ED Discharge Orders    None       Seleni Meller, Annie Main, MD 07/29/19 [phone]

## 2019-07-29 NOTE — ED Notes (Signed)
Pt. Ambulated and his O2 saturation stayed between 97-100%. Pt. Had no signs of wheezing or shortness of breath and stated he felt fine. Pt. Also stated he only experienced issues upon walking up hills.

## 2019-07-29 NOTE — Discharge Instructions (Addendum)
It appears your pneumonia was possibly undertreated.  Take the additional antibiotics as prescribed.  Follow-up with your doctor.  Return to the ED with difficulty breathing, chest pain, unable to eat or drink or any other concerns.

## 2019-08-08 ENCOUNTER — Other Ambulatory Visit: Payer: Self-pay

## 2019-08-08 ENCOUNTER — Emergency Department (HOSPITAL_COMMUNITY): Payer: Self-pay

## 2019-08-08 ENCOUNTER — Emergency Department (HOSPITAL_COMMUNITY)
Admission: EM | Admit: 2019-08-08 | Discharge: 2019-08-08 | Disposition: A | Discharge status: Home / Self Care | Payer: Self-pay | Attending: Emergency Medicine | Admitting: Emergency Medicine

## 2019-08-08 ENCOUNTER — Encounter (HOSPITAL_COMMUNITY): Payer: Self-pay | Admitting: *Deleted

## 2019-08-08 DIAGNOSIS — R519 Headache, unspecified: Secondary | ICD-10-CM

## 2019-08-08 DIAGNOSIS — R05 Cough: Secondary | ICD-10-CM | POA: Insufficient documentation

## 2019-08-08 DIAGNOSIS — J0101 Acute recurrent maxillary sinusitis: Secondary | ICD-10-CM | POA: Insufficient documentation

## 2019-08-08 DIAGNOSIS — J45909 Unspecified asthma, uncomplicated: Secondary | ICD-10-CM | POA: Insufficient documentation

## 2019-08-08 DIAGNOSIS — Y999 Unspecified external cause status: Secondary | ICD-10-CM | POA: Insufficient documentation

## 2019-08-08 DIAGNOSIS — Y9301 Activity, walking, marching and hiking: Secondary | ICD-10-CM | POA: Insufficient documentation

## 2019-08-08 DIAGNOSIS — R42 Dizziness and giddiness: Secondary | ICD-10-CM | POA: Insufficient documentation

## 2019-08-08 DIAGNOSIS — Y92009 Unspecified place in unspecified non-institutional (private) residence as the place of occurrence of the external cause: Secondary | ICD-10-CM | POA: Insufficient documentation

## 2019-08-08 DIAGNOSIS — W19XXXA Unspecified fall, initial encounter: Secondary | ICD-10-CM | POA: Insufficient documentation

## 2019-08-08 DIAGNOSIS — S0990XA Unspecified injury of head, initial encounter: Secondary | ICD-10-CM | POA: Insufficient documentation

## 2019-08-08 DIAGNOSIS — I10 Essential (primary) hypertension: Secondary | ICD-10-CM | POA: Insufficient documentation

## 2019-08-08 DIAGNOSIS — Z7951 Long term (current) use of inhaled steroids: Secondary | ICD-10-CM | POA: Insufficient documentation

## 2019-08-08 MED ORDER — METOCLOPRAMIDE HCL 5 MG/ML IJ SOLN
10.0000 mg | Freq: Once | INTRAMUSCULAR | Status: AC
  Administered 2019-08-08: 10 mg via INTRAVENOUS
  Filled 2019-08-08: qty 2

## 2019-08-08 MED ORDER — LEVOFLOXACIN 750 MG PO TABS
750.0000 mg | ORAL_TABLET | Freq: Once | ORAL | Status: AC
  Administered 2019-08-08: 750 mg via ORAL
  Filled 2019-08-08: qty 1

## 2019-08-08 MED ORDER — DIPHENHYDRAMINE HCL 50 MG/ML IJ SOLN
25.0000 mg | Freq: Once | INTRAMUSCULAR | Status: AC
  Administered 2019-08-08: 25 mg via INTRAVENOUS
  Filled 2019-08-08: qty 1

## 2019-08-08 MED ORDER — LEVOFLOXACIN 750 MG PO TABS
750.0000 mg | ORAL_TABLET | Freq: Every day | ORAL | 0 refills | Status: DC
Start: 2019-08-09 — End: 2019-08-12

## 2019-08-08 MED ORDER — DEXAMETHASONE SODIUM PHOSPHATE 10 MG/ML IJ SOLN
8.0000 mg | Freq: Once | INTRAMUSCULAR | Status: AC
  Administered 2019-08-08: 8 mg via INTRAVENOUS
  Filled 2019-08-08: qty 1

## 2019-08-08 NOTE — Discharge Instructions (Signed)
You are being treated for both sinusitis (sinus infection) and also for a possible pneumonia.  You'll start taking your antibiotic TOMORROW because you got your dose today in the ER.  Please take the full course.

## 2019-08-08 NOTE — ED Triage Notes (Signed)
Pt arrival via ems, fell off his porch at home and hit his head. Hit the right side of his head. Ems reports it was about 3 foot from porch to ground. Pt also reports he has been without his b/p meds x 2 weeks. B/p was 180's/120's at scene , better here on arrival.

## 2019-08-08 NOTE — ED Provider Notes (Signed)
[facility] EMERGENCY DEPARTMENT Provider Note   CSN: [id number] Arrival date & time: 08/08/19  1355     History Chief Complaint  Patient presents with  . Fall    Logan Dawson is a 24 y.o. male w/ hx of skull fracture and subdural hematoma in 2015, presenting to ED with fall and head injury.  He reports he has had a persistent cough for a month and a half.  He says he was initially treated for pneumonia with an antibiotic nearly a month ago (azithromycin, 5 days) which he completed.Marland Kitchen  However he continues to have this productive cough.  He was told that he had pneumonia again when he came to the ED on 07/29/19 per records.  At that time he had a CT PE study negative for PE but noting likely bronchopneumonia and was discharged with augmentin and doxycycline but states the script was never sent to his pharmacy, and so he never began that treatment.  He presents back to the ED today specifically for a fall with a head injury.  He says he was hanging out on his porch today leaning against a pillar.  When he tried to walk across the porch he lost his balance and fell off the side of the porch, falling approximately 3 feet and striking the right side of his head on the ground.  Was no loss of consciousness.  This was witnessed by friends in the house.  He said he went inside and was having significant headache after the fall.  He denies any vomiting.  He denies any other injuries from the fall.  He continues to complain of a productive cough and says he has felt fatigued this week.  He does report to me that he had Covid several months ago.  He has been tested multiple times for Covid since then have been negative on each test including on his ED visit June 23rd.  On June 23rd in the ED he also had troponins (flat, negative), BMP and CBC.  HPI     Past Medical History:  Diagnosis Date  . ADHD (attention deficit hyperactivity disorder)   . Asthma   . Gastroenteritis   . Hypertension   .  Left shoulder pain   . Meningitis   . Skull fracture (HCC)   . Subarachnoid hemorrhage [facility])     Patient Active Problem List   Diagnosis Date Noted  . Gastroenteritis 04/14/2018  . Black-out (not amnesia) 11/27/2012  . Concussion with < 1 hr loss of consciousness 11/27/2012  . Hearing voices 11/27/2012  . Skull fracture with concussion (HCC) 11/27/2012  . Subarachnoid hemorrhage (HCC) 11/27/2012  . Unable to control anger 11/27/2012  . Abrasions of multiple sites 11/14/2012  . Linear skull fracture (HCC) 11/14/2012  . SAH (subarachnoid hemorrhage) (HCC) 11/14/2012  . Trauma 11/14/2012    Past Surgical History:  Procedure Laterality Date  . APPENDECTOMY    . CHOLECYSTECTOMY    . HERNIA REPAIR         Family History  Problem Relation Age of Onset  . Diabetes Mother   . COPD Mother   . Diabetes Father   . Hypertension Father   . Heart disease Father     Social History   Tobacco Use  . Smoking status: Never Smoker  . Smokeless tobacco: Never Used  Vaping Use  . Vaping Use: Never used  Substance Use Topics  . Alcohol use: Yes    Comment: occ.  . Drug use: No  Home Medications Prior to Admission medications   Medication Sig Start Date End Date Taking? Authorizing Provider  albuterol (VENTOLIN HFA) 108 (90 Base) MCG/ACT inhaler Inhale 1 puff into the lungs every 4 (four) hours as needed for wheezing.  06/10/19  Yes [provider]  famotidine (PEPCID) 20 MG tablet Take 20 mg by mouth 2 (two) times daily.   Yes [provider]  FLUoxetine (PROZAC) 20 MG capsule Take 20 mg by mouth daily. 06/10/19  Yes [provider]  metoprolol succinate (TOPROL-XL) 25 MG 24 hr tablet Take 25 mg by mouth daily. 10/17/18  Yes [provider]  Spacer/Aero Chamber Mouthpiece MISC 1 each by Does not apply route every 6 (six) hours as needed. 07/20/16  Yes Johna Sheriff, MD  amoxicillin-clavulanate (AUGMENTIN) 875-125 MG tablet Take 1 tablet by mouth  every 12 (twelve) hours. Patient not taking: Reported on 08/08/2019 07/29/19   Glynn Octave, MD  doxycycline (VIBRAMYCIN) 100 MG capsule Take 1 capsule (100 mg total) by mouth 2 (two) times daily. Patient not taking: Reported on 08/08/2019 07/29/19   Glynn Octave, MD  levofloxacin (LEVAQUIN) 750 MG tablet Take 1 tablet (750 mg total) by mouth daily for 4 days. 08/09/19 08/13/19  Terald Sleeper, MD  ondansetron (ZOFRAN ODT) 4 MG disintegrating tablet Take 1 tablet (4 mg total) by mouth every 8 (eight) hours as needed for nausea. Patient not taking: Reported on 08/08/2019 07/05/19   Garlon Hatchet, PA-C  ondansetron (ZOFRAN) 4 MG tablet Take 1 tablet (4 mg total) by mouth every 6 (six) hours. Patient not taking: Reported on 07/05/2019 10/21/18   Gailen Shelter, PA  pantoprazole (PROTONIX) 40 MG tablet Take 40 mg by mouth in the morning and at bedtime. Patient not taking: Reported on 08/08/2019 06/10/19   [provider]  potassium chloride SA (KLOR-CON) 20 MEQ tablet Take 1 tablet (20 mEq total) by mouth 2 (two) times daily. Patient not taking: Reported on 07/05/2019 12/15/18   Petrucelli, Pleas Koch, PA-C  predniSONE (DELTASONE) 50 MG tablet 1 tablet PO daily Patient not taking: Reported on 08/08/2019 07/29/19   Glynn Octave, MD  dicyclomine (BENTYL) 20 MG tablet Take 1 tablet (20 mg total) by mouth every 8 (eight) hours as needed for spasms. 12/15/18 06/07/19  Petrucelli, Samantha R, PA-C    Allergies    Sulfa antibiotics  Review of Systems   Review of Systems  Constitutional: Negative for chills and fever.  HENT: Negative for ear pain and sore throat.   Eyes: Negative for pain and visual disturbance.  Respiratory: Positive for cough. Negative for shortness of breath.   Cardiovascular: Negative for chest pain and palpitations.  Gastrointestinal: Negative for abdominal pain, nausea and vomiting.  Genitourinary: Negative for dysuria and hematuria.  Musculoskeletal: Negative for  arthralgias and back pain.  Skin: Negative for pallor and rash.  Neurological: Positive for light-headedness and headaches. Negative for syncope.  Psychiatric/Behavioral: Negative for agitation and confusion.  All other systems reviewed and are negative.   Physical Exam Updated Vital Signs BP 132/77   Pulse 95   Temp 98 F (36.7 C) (Oral)   Resp 18   Ht 5\' 11"  (1.803 m)   Wt 119.3 kg   SpO2 93%   BMI 36.68 kg/m   Physical Exam Vitals and nursing note reviewed.  Constitutional:      Appearance: He is well-developed. He is obese.  HENT:     Head: Normocephalic and atraumatic.     Right Ear:  Tympanic membrane and ear canal normal.     Left Ear: Tympanic membrane and ear canal normal.  Eyes:     Conjunctiva/sclera: Conjunctivae normal.     Pupils: Pupils are equal, round, and reactive to light.  Cardiovascular:     Rate and Rhythm: Normal rate and regular rhythm.     Pulses: Normal pulses.  Pulmonary:     Effort: Pulmonary effort is normal. No respiratory distress.     Breath sounds: Normal breath sounds.     Comments: Dry hacking cough on exam Abdominal:     General: There is no distension.     Palpations: Abdomen is soft.     Tenderness: There is no abdominal tenderness.  Musculoskeletal:     Cervical back: Neck supple.  Skin:    General: Skin is warm and dry.  Neurological:     General: No focal deficit present.     Mental Status: He is alert and oriented to person, place, and time.  Psychiatric:        Mood and Affect: Mood normal.        Behavior: Behavior normal.     ED Results / Procedures / Treatments   Labs (all labs ordered are listed, but only abnormal results are displayed) Labs Reviewed - No data to display  EKG None  Radiology DG Chest 2 View  Result Date: 08/08/2019 CLINICAL DATA:  Cough, syncope EXAM: CHEST - 2 VIEW COMPARISON:  Chest radiograph dated 07/28/2019 FINDINGS: The heart size and mediastinal contours are within normal limits.  Both lungs are clear. The visualized skeletal structures are unremarkable. IMPRESSION: No active cardiopulmonary disease. Electronically Signed   By: Romona Curls M.D.   On: 08/08/2019 15:06   CT Head Wo Contrast  Result Date: 08/08/2019 CLINICAL DATA:  Status post fall. EXAM: CT HEAD WITHOUT CONTRAST TECHNIQUE: Contiguous axial images were obtained from the base of the skull through the vertex without intravenous contrast. COMPARISON:  March 19, 2013 FINDINGS: Brain: No evidence of acute infarction, hemorrhage, hydrocephalus, extra-axial collection or mass lesion/mass effect. Vascular: No hyperdense vessel or unexpected calcification. Skull: Normal. Negative for fracture or focal lesion. Sinuses/Orbits: There is marked severity right maxillary sinus and right ethmoid sinus mucosal thickening. Mild right-sided frontal sinus mucosal thickening is also noted. Other: None. IMPRESSION: 1. No acute intracranial abnormality. 2. Marked severity right maxillary sinus and right ethmoid sinus mucosal thickening. Mild right-sided frontal sinus mucosal thickening is also noted. Electronically Signed   By: Aram Candela M.D.   On: 08/08/2019 15:43    Procedures Procedures (including critical care time)  Medications Ordered in ED Medications  metoCLOPramide (REGLAN) injection 10 mg (10 mg Intravenous Given 08/08/19 1539)  diphenhydrAMINE (BENADRYL) injection 25 mg (25 mg Intravenous Given 08/08/19 1540)  dexamethasone (DECADRON) injection 8 mg (8 mg Intravenous Given 08/08/19 1540)  levofloxacin (LEVAQUIN) tablet 750 mg (750 mg Oral Given 08/08/19 1556)    ED Course  I have reviewed the triage vital signs and the nursing notes.  Pertinent labs & imaging results that were available during my care of the patient were reviewed by me and considered in my medical decision making (see chart for details).  24 year old male presented to emergency department with mechanical fall off his porch earlier today, striking  his head on the ground from 3 feet fall.  He is having significant right-sided headache.  No obvious signs of trauma on his exam.  No other injuries noted to the extremities.  CT scan of the  head was obtained which showed no acute fracture or intracranial bleed.  He does noted to have significant right ethmoid sinus thickening and maxillary sinus thickening consistent with sinusitis.  He has also had a very persistent cough for about a month and a half now.  He was not able to pick up his prescriptions from his last visit to the ER on 07/29/19 for bronchopneumonia noted on CT PE study.  I think is reasonable to prescribe him antibiotics at this time given a dose in the ER.  I think levofloxacin would be an excellent choice to treat for both pneumonia as well as sinusitis.  Is also simple medication that he can take once daily for a 5-day course.  He is otherwise stable and well-appearing.  Do not suspect that he has sepsis.  He had a very thorough work-up for chest pain and dizziness on 6/23 including troponins, ecg, and CTPE which was unremarkable.  I do not think we need to repeat this work-up at this time.  We will treat him for his headache with an IV migraine cocktail.  Also give him p.o. antibiotics here and discharge him on antibiotics at home.  Prior medical records were reviewed by myself including his last CT scan and ED visit.  Clinical Course as of Aug 08 1642  Sat Aug 08, 2019  1556 IMPRESSION: 1. No acute intracranial abnormality. 2. Marked severity right maxillary sinus and right ethmoid sinus mucosal thickening. Mild right-sided frontal sinus mucosal thickening is also noted.   [MT]    Clinical Course User Index [MT] Terald Sleeperrifan, Leslie Jester J, MD    Final Clinical Impression(s) / ED Diagnoses Final diagnoses:  Acute recurrent maxillary sinusitis  Fall, initial encounter  Traumatic injury of head, initial encounter  Acute nonintractable headache, unspecified headache type    Rx /  DC Orders ED Discharge Orders         Ordered    levofloxacin (LEVAQUIN) 750 MG tablet  Daily     Discontinue  Reprint     08/08/19 1627           Terald Sleeperrifan, Estelita Iten J, MD 08/08/19 1644

## 2019-08-10 ENCOUNTER — Other Ambulatory Visit: Payer: Self-pay

## 2019-08-10 ENCOUNTER — Inpatient Hospital Stay (HOSPITAL_COMMUNITY)
Admission: EM | Admit: 2019-08-10 | Discharge: 2019-08-12 | DRG: 871 | Disposition: A | Discharge status: Home / Self Care | Payer: Self-pay | Attending: Family Medicine | Admitting: Family Medicine

## 2019-08-10 ENCOUNTER — Encounter (HOSPITAL_COMMUNITY): Payer: Self-pay

## 2019-08-10 ENCOUNTER — Emergency Department (HOSPITAL_COMMUNITY): Payer: Self-pay

## 2019-08-10 DIAGNOSIS — Z825 Family history of asthma and other chronic lower respiratory diseases: Secondary | ICD-10-CM

## 2019-08-10 DIAGNOSIS — A419 Sepsis, unspecified organism: Principal | ICD-10-CM | POA: Diagnosis present

## 2019-08-10 DIAGNOSIS — F909 Attention-deficit hyperactivity disorder, unspecified type: Secondary | ICD-10-CM | POA: Diagnosis present

## 2019-08-10 DIAGNOSIS — Z87820 Personal history of traumatic brain injury: Secondary | ICD-10-CM

## 2019-08-10 DIAGNOSIS — Z882 Allergy status to sulfonamides status: Secondary | ICD-10-CM

## 2019-08-10 DIAGNOSIS — E876 Hypokalemia: Secondary | ICD-10-CM | POA: Diagnosis present

## 2019-08-10 DIAGNOSIS — J121 Respiratory syncytial virus pneumonia: Secondary | ICD-10-CM | POA: Diagnosis present

## 2019-08-10 DIAGNOSIS — Z8661 Personal history of infections of the central nervous system: Secondary | ICD-10-CM

## 2019-08-10 DIAGNOSIS — I10 Essential (primary) hypertension: Secondary | ICD-10-CM | POA: Diagnosis present

## 2019-08-10 DIAGNOSIS — J4521 Mild intermittent asthma with (acute) exacerbation: Secondary | ICD-10-CM | POA: Diagnosis present

## 2019-08-10 DIAGNOSIS — Z9049 Acquired absence of other specified parts of digestive tract: Secondary | ICD-10-CM

## 2019-08-10 DIAGNOSIS — F329 Major depressive disorder, single episode, unspecified: Secondary | ICD-10-CM | POA: Diagnosis present

## 2019-08-10 DIAGNOSIS — J189 Pneumonia, unspecified organism: Secondary | ICD-10-CM | POA: Diagnosis present

## 2019-08-10 DIAGNOSIS — Z20822 Contact with and (suspected) exposure to covid-19: Secondary | ICD-10-CM | POA: Diagnosis present

## 2019-08-10 DIAGNOSIS — F419 Anxiety disorder, unspecified: Secondary | ICD-10-CM | POA: Diagnosis present

## 2019-08-10 DIAGNOSIS — Z7952 Long term (current) use of systemic steroids: Secondary | ICD-10-CM

## 2019-08-10 DIAGNOSIS — Z79899 Other long term (current) drug therapy: Secondary | ICD-10-CM

## 2019-08-10 DIAGNOSIS — Z8616 Personal history of COVID-19: Secondary | ICD-10-CM

## 2019-08-10 DIAGNOSIS — Z9114 Patient's other noncompliance with medication regimen: Secondary | ICD-10-CM

## 2019-08-10 DIAGNOSIS — K219 Gastro-esophageal reflux disease without esophagitis: Secondary | ICD-10-CM | POA: Diagnosis present

## 2019-08-10 LAB — CBC
HCT: 39.3 % (ref 39.0–52.0)
Hemoglobin: 13.3 g/dL (ref 13.0–17.0)
MCH: 30.1 pg (ref 26.0–34.0)
MCHC: 33.8 g/dL (ref 30.0–36.0)
MCV: 88.9 fL (ref 80.0–100.0)
Platelets: 214 10*3/uL (ref 150–400)
RBC: 4.42 MIL/uL (ref 4.22–5.81)
RDW: 12.8 % (ref 11.5–15.5)
WBC: 14.2 10*3/uL — ABNORMAL HIGH (ref 4.0–10.5)
nRBC: 0 % (ref 0.0–0.2)

## 2019-08-10 LAB — BASIC METABOLIC PANEL
Anion gap: 9 (ref 5–15)
Anion gap: 9 (ref 5–15)
BUN: 14 mg/dL (ref 6–20)
BUN: 15 mg/dL (ref 6–20)
CO2: 27 mmol/L (ref 22–32)
CO2: 24 mmol/L (ref 22–32)
Calcium: 9 mg/dL (ref 8.9–10.3)
Calcium: 8.2 mg/dL — ABNORMAL LOW (ref 8.9–10.3)
Chloride: 103 mmol/L (ref 98–111)
Chloride: 103 mmol/L (ref 98–111)
Creatinine, Ser: 0.97 mg/dL (ref 0.61–1.24)
Creatinine, Ser: 0.94 mg/dL (ref 0.61–1.24)
GFR calc Af Amer: >60 mL/min (ref >60)
GFR calc Af Amer: >60 mL/min (ref >60)
GFR calc non Af Amer: >60 mL/min (ref >60)
GFR calc non Af Amer: >60 mL/min (ref >60)
Glucose, Bld: 117 mg/dL — ABNORMAL HIGH (ref 70–99)
Glucose, Bld: 115 mg/dL — ABNORMAL HIGH (ref 70–99)
Potassium: 3.6 mmol/L (ref 3.5–5.1)
Potassium: 3.3 mmol/L — ABNORMAL LOW (ref 3.5–5.1)
Sodium: 139 mmol/L (ref 135–145)
Sodium: 136 mmol/L (ref 135–145)

## 2019-08-10 LAB — CBC WITH DIFFERENTIAL/PLATELET
Abs Immature Granulocytes: 0.09 10*3/uL — ABNORMAL HIGH (ref 0.00–0.07)
Basophils Absolute: 0.1 10*3/uL (ref 0.0–0.1)
Basophils Relative: 0 %
Eosinophils Absolute: 0.1 10*3/uL (ref 0.0–0.5)
Eosinophils Relative: 0 %
HCT: 43.3 % (ref 39.0–52.0)
Hemoglobin: 15.1 g/dL (ref 13.0–17.0)
Immature Granulocytes: 1 %
Lymphocytes Relative: 17 %
Lymphs Abs: 2.7 10*3/uL (ref 0.7–4.0)
MCH: 30.3 pg (ref 26.0–34.0)
MCHC: 34.9 g/dL (ref 30.0–36.0)
MCV: 86.9 fL (ref 80.0–100.0)
Monocytes Absolute: 1.5 10*3/uL — ABNORMAL HIGH (ref 0.1–1.0)
Monocytes Relative: 9 %
Neutro Abs: 11.6 10*3/uL — ABNORMAL HIGH (ref 1.7–7.7)
Neutrophils Relative %: 73 %
Platelets: 239 10*3/uL (ref 150–400)
RBC: 4.98 MIL/uL (ref 4.22–5.81)
RDW: 12.7 % (ref 11.5–15.5)
WBC: 16 10*3/uL — ABNORMAL HIGH (ref 4.0–10.5)
nRBC: 0 % (ref 0.0–0.2)

## 2019-08-10 LAB — PROCALCITONIN: Procalcitonin: <0.1 ng/mL

## 2019-08-10 LAB — RESP PANEL BY RT PCR (RSV, FLU A&B, COVID)
Influenza A by PCR: NEGATIVE
Influenza B by PCR: NEGATIVE
Respiratory Syncytial Virus by PCR: POSITIVE — AB
SARS Coronavirus 2 by RT PCR: NEGATIVE

## 2019-08-10 LAB — C-REACTIVE PROTEIN: CRP: 1.3 mg/dL — ABNORMAL HIGH (ref <1.0)

## 2019-08-10 LAB — LACTIC ACID, PLASMA: Lactic Acid, Venous: 1.5 mmol/L (ref 0.5–1.9)

## 2019-08-10 LAB — SARS CORONAVIRUS 2 BY RT PCR (HOSPITAL ORDER, PERFORMED IN ~~LOC~~ HOSPITAL LAB): SARS Coronavirus 2: NEGATIVE

## 2019-08-10 LAB — HIV ANTIBODY (ROUTINE TESTING W REFLEX): HIV Screen 4th Generation wRfx: NONREACTIVE

## 2019-08-10 MED ORDER — LACTATED RINGERS IV BOLUS (SEPSIS)
1000.0000 mL | Freq: Once | INTRAVENOUS | Status: AC
  Administered 2019-08-10: 1000 mL via INTRAVENOUS

## 2019-08-10 MED ORDER — ONDANSETRON HCL 4 MG PO TABS: 4.0000 mg | ORAL_TABLET | Freq: Four times a day (QID) | ORAL | Status: DC | PRN

## 2019-08-10 MED ORDER — SODIUM CHLORIDE 0.9 % IV SOLN
500.0000 mg | INTRAVENOUS | Status: DC
  Administered 2019-08-10 – 2019-08-11 (×3): 500 mg via INTRAVENOUS
  Filled 2019-08-10 (×3): qty 500

## 2019-08-10 MED ORDER — BENZONATATE 100 MG PO CAPS
100.0000 mg | ORAL_CAPSULE | Freq: Three times a day (TID) | ORAL | Status: DC | PRN
  Administered 2019-08-10 (×3): 100 mg via ORAL
  Filled 2019-08-10 (×3): qty 1

## 2019-08-10 MED ORDER — IPRATROPIUM-ALBUTEROL 0.5-2.5 (3) MG/3ML IN SOLN
3.0000 mL | Freq: Three times a day (TID) | RESPIRATORY_TRACT | Status: DC
  Administered 2019-08-11 – 2019-08-12 (×4): 3 mL via RESPIRATORY_TRACT
  Filled 2019-08-10 (×4): qty 3

## 2019-08-10 MED ORDER — FLUOXETINE HCL 20 MG PO CAPS
20.0000 mg | ORAL_CAPSULE | Freq: Every day | ORAL | Status: DC
  Administered 2019-08-10 – 2019-08-12 (×3): 20 mg via ORAL
  Filled 2019-08-10 (×3): qty 1

## 2019-08-10 MED ORDER — BISACODYL 10 MG RE SUPP: 10.0000 mg | Freq: Every day | RECTAL | Status: DC | PRN

## 2019-08-10 MED ORDER — KETOROLAC TROMETHAMINE 30 MG/ML IJ SOLN
30.0000 mg | Freq: Once | INTRAMUSCULAR | Status: AC
  Administered 2019-08-10: 30 mg via INTRAVENOUS
  Filled 2019-08-10: qty 1

## 2019-08-10 MED ORDER — IPRATROPIUM-ALBUTEROL 0.5-2.5 (3) MG/3ML IN SOLN
3.0000 mL | RESPIRATORY_TRACT | Status: DC | PRN
  Administered 2019-08-11: 3 mL via RESPIRATORY_TRACT
  Filled 2019-08-10: qty 3

## 2019-08-10 MED ORDER — SODIUM CHLORIDE 0.9% FLUSH: 3.0000 mL | INTRAVENOUS | Status: DC | PRN

## 2019-08-10 MED ORDER — GUAIFENESIN-CODEINE 100-10 MG/5ML PO SOLN
10.0000 mL | Freq: Four times a day (QID) | ORAL | Status: AC | PRN
  Administered 2019-08-10: 10 mL via ORAL
  Filled 2019-08-10: qty 10

## 2019-08-10 MED ORDER — SODIUM CHLORIDE 0.9 % IV SOLN: 250.0000 mL | INTRAVENOUS | Status: DC | PRN

## 2019-08-10 MED ORDER — GUAIFENESIN-DM 100-10 MG/5ML PO SYRP
10.0000 mL | ORAL_SOLUTION | ORAL | Status: DC | PRN
  Administered 2019-08-10 (×4): 10 mL via ORAL
  Filled 2019-08-10 (×4): qty 10

## 2019-08-10 MED ORDER — METOPROLOL SUCCINATE ER 25 MG PO TB24
25.0000 mg | ORAL_TABLET | Freq: Every day | ORAL | Status: DC
  Administered 2019-08-10 – 2019-08-12 (×4): 25 mg via ORAL
  Filled 2019-08-10 (×4): qty 1

## 2019-08-10 MED ORDER — IPRATROPIUM-ALBUTEROL 0.5-2.5 (3) MG/3ML IN SOLN
3.0000 mL | Freq: Four times a day (QID) | RESPIRATORY_TRACT | Status: DC
  Administered 2019-08-10 (×2): 3 mL via RESPIRATORY_TRACT
  Filled 2019-08-10 (×2): qty 3

## 2019-08-10 MED ORDER — POLYETHYLENE GLYCOL 3350 17 G PO PACK: 17.0000 g | PACK | Freq: Every day | ORAL | Status: DC | PRN

## 2019-08-10 MED ORDER — ONDANSETRON HCL 4 MG/2ML IJ SOLN: 4.0000 mg | Freq: Four times a day (QID) | INTRAMUSCULAR | Status: DC | PRN

## 2019-08-10 MED ORDER — LACTATED RINGERS IV SOLN: INTRAVENOUS | Status: DC

## 2019-08-10 MED ORDER — SODIUM CHLORIDE 0.9% FLUSH
3.0000 mL | Freq: Two times a day (BID) | INTRAVENOUS | Status: DC
  Administered 2019-08-10: 3 mL via INTRAVENOUS

## 2019-08-10 MED ORDER — IPRATROPIUM-ALBUTEROL 0.5-2.5 (3) MG/3ML IN SOLN
3.0000 mL | RESPIRATORY_TRACT | Status: DC | PRN
  Administered 2019-08-10: 3 mL via RESPIRATORY_TRACT
  Filled 2019-08-10: qty 3

## 2019-08-10 MED ORDER — PREDNISONE 20 MG PO TABS
50.0000 mg | ORAL_TABLET | Freq: Every day | ORAL | Status: DC
  Administered 2019-08-10 – 2019-08-12 (×3): 50 mg via ORAL
  Filled 2019-08-10 (×4): qty 1

## 2019-08-10 MED ORDER — FAMOTIDINE 20 MG PO TABS
20.0000 mg | ORAL_TABLET | Freq: Two times a day (BID) | ORAL | Status: DC
  Administered 2019-08-10 – 2019-08-12 (×5): 20 mg via ORAL
  Filled 2019-08-10 (×5): qty 1

## 2019-08-10 MED ORDER — ACETAMINOPHEN 325 MG PO TABS
650.0000 mg | ORAL_TABLET | Freq: Once | ORAL | Status: AC
  Administered 2019-08-10: 650 mg via ORAL
  Filled 2019-08-10: qty 2

## 2019-08-10 MED ORDER — SODIUM CHLORIDE 0.9 % IV SOLN
2.0000 g | INTRAVENOUS | Status: DC
  Administered 2019-08-10 – 2019-08-11 (×3): 2 g via INTRAVENOUS
  Filled 2019-08-10 (×3): qty 20

## 2019-08-10 MED ORDER — LACTATED RINGERS IV BOLUS (SEPSIS)
400.0000 mL | Freq: Once | INTRAVENOUS | Status: AC
  Administered 2019-08-10: 400 mL via INTRAVENOUS

## 2019-08-10 NOTE — Progress Notes (Signed)
Logan Dawson is a 24 y.o. male with medical history significant for hypertension, asthma, depression, GERD, history of abdominal surgeries, skull fracture with subdural hematoma 2015 who presented to the ER with persistent cough, shortness of breath.   08/10/19: Seen and examined at his bedside.  Reports persistent semiproductive cough.  Admitted for community-acquired pneumonia superimposed by RSV viral infection.  Continue IV antibiotics and bronchodilators.  Monitor fever curve and WBC.  Please refer to H&P dictated by my partner Dr. Teodoro Kil on 08/10/2019 for further details of the assessment and plan.

## 2019-08-10 NOTE — ED Triage Notes (Signed)
Pt arrived from home c/o SOB. Pt reports recent Dx of pneumonia and has had worsening symptoms since. Pt has active, strong, unproductive cough and arrives tachycardic on the monitor. Prior to [facility] arrival, Pt reports using his home inhaler without relief from symptoms.

## 2019-08-10 NOTE — H&P (Signed)
History and Physical    Patient Demographics:    Logan Dawson RUE:[id number] DOB: [birth year] DOA: 08/10/2019  [facility]  Patient coming from: Home  I have personally briefly reviewed patient's [facility]  Chief Complaint: Cough, shortness of breath   Assessment & Plan:     Assessment/Plan Principal Problem:   Community acquired pneumonia   Logan Dawson is a 24 y.o. male with medical history significant for hypertension, asthma, depression, GERD, history of abdominal surgeries, skull fracture with subdural hematoma 2015 who presented to the ER with persistent cough, shortness of breath.   Principal Problem: Sepsis secondary to community-acquired pneumonia Patient has been having cough for about a month and a half.  [facility] and treated with 5 days of oral azithromycin.  [facility] 07[facility]29/2019 with persistent symptoms and was prescribed Augmentin and doxycycline however never actually took the medication.  Return back to ER on 08/08/2019 and was prescribed Levaquin orally but has only taken 2 doses so far.  Patient was referred for admission to hospital service for presumed failure of oral antibiotics however it seems he had some issues with compliance.  Nonetheless patient does have a temperature of 100, mild tachycardia, leukocytosis to 16.0 on current presentation.  No hypoxemia noted. -We will place on IV antibiotics -Send sputum culture -Monitor CBC, temp -We will send CRP, procalcitonin  Other Active Problems: Mild intermittent asthma with acute exacerbation -We will place on nebulizers -We will give short course of steroids  Hypertension -Continue metoprolol  DVT prophylaxis: SCDs Code Status:  Full code Family Communication: N/A  Disposition Plan: Observation stay for 24 to 48 hours of IV antibiotics for refractory pneumonia Consults called: N/A Admission status: Observation  stay    HPI:     HPI: Logan MowJacob C Case is a 24 y.o. male with medical history significant for hypertension, asthma, depression, GERD, history of abdominal surgeries, skull fracture with subdural hematoma 2015 who presented to the ER with persistent cough, shortness of breath.  Patient reports he has been having a cough for about a month and a half.  He was treated with oral azithromycin for 5 days but continued to have a productive cough.  He was then seen in the ER on 07/29/2019 as per prior records and had a work-up including a CT PE study that was negative for PE but noted likely bronchopneumonia.  Patient was discharged home on Augmentin and doxycycline but never began the treatment.  Patient returned back to the ER on 08/08/2019 after having a mechanical fall.  Work-up was done including a CT of the head which was negative.  He was then prescribed Levaquin and discharged home.  He returned back to the ER again for shortness of breath. He reports productive cough, chest tightness but no wheezing. He had mild fever today with chills.  No nausea, vomiting, abdominal pain, diarrhea, dizziness, lightheadedness, seizure, syncope.  ED Course:  Vital Signs reviewed on presentation, significant for temperature 100, heart rate 121, blood pressure 123/76, saturation 97% on room air. Labs reviewed, significant for sodium 139, potassium 3.6, BUN 14, creatinine 0.9, lactic acid 1.5, WBC count 16.0, hemoglobin 15.1, hematocrit 43, platelets 239, SARS Covid RT-PCR is negative. Imaging personally Reviewed, chest x-ray shows increasing reticulonodular opacities in the right mid to upper lung which could represent worsening multifocal infection. EKG personally reviewed, shows sinus tachycardia, no acute ST-T changes.    Review  of systems:    Review of Systems: As per HPI otherwise 10 point review of systems negative.  All other review of systems is negative except the ones noted above in the HPI.    Past Medical  and Surgical History:  Reviewed by me  Past Medical History:  Diagnosis Date  . ADHD (attention deficit hyperactivity disorder)   . Asthma   . Gastroenteritis   . Hypertension   . Left shoulder pain   . Meningitis   . Skull fracture (HCC)   . Subarachnoid hemorrhage [facility])     Past Surgical History:  Procedure Laterality Date  . APPENDECTOMY    . CHOLECYSTECTOMY    . HERNIA REPAIR       Social History:  Reviewed by me   reports that he has never smoked. He has never used smokeless tobacco. He reports current alcohol use. He reports that he does not use drugs.  Allergies:    Allergies  Allergen Reactions  . Sulfa Antibiotics Hives    Family History :   Family History  Problem Relation Age of Onset  . Diabetes Mother   . COPD Mother   . Diabetes Father   . Hypertension Father   . Heart disease Father    Family history reviewed, noted as above, not pertinent to current presentation.   Home Medications:    Prior to Admission medications   Medication Sig Start Date End Date Taking? Authorizing Provider  albuterol (VENTOLIN HFA) 108 (90 Base) MCG/ACT inhaler Inhale 1 puff into the lungs every 4 (four) hours as needed for wheezing.  06/10/19   [provider]  amoxicillin-clavulanate (AUGMENTIN) 875-125 MG tablet Take 1 tablet by mouth every 12 (twelve) hours. Patient not taking: Reported on 08/08/2019 07/29/19   Glynn Octave, MD  doxycycline (VIBRAMYCIN) 100 MG capsule Take 1 capsule (100 mg total) by mouth 2 (two) times daily. Patient not taking: Reported on 08/08/2019 07/29/19   Glynn Octave, MD  famotidine (PEPCID) 20 MG tablet Take 20 mg by mouth 2 (two) times daily.    [provider]  FLUoxetine (PROZAC) 20 MG capsule Take 20 mg by mouth daily. 06/10/19   [provider]  levofloxacin (LEVAQUIN) 750 MG tablet Take 1 tablet (750 mg total) by mouth daily for 4 days. 08/09/19 08/13/19  Terald Sleeper, MD  metoprolol succinate (TOPROL-XL)  25 MG 24 hr tablet Take 25 mg by mouth daily. 10/17/18   [provider]  ondansetron (ZOFRAN ODT) 4 MG disintegrating tablet Take 1 tablet (4 mg total) by mouth every 8 (eight) hours as needed for nausea. Patient not taking: Reported on 08/08/2019 07/05/19   Garlon Hatchet, PA-C  ondansetron (ZOFRAN) 4 MG tablet Take 1 tablet (4 mg total) by mouth every 6 (six) hours. Patient not taking: Reported on 07/05/2019 10/21/18   Gailen Shelter, PA  pantoprazole (PROTONIX) 40 MG tablet Take 40 mg by mouth in the morning and at bedtime. Patient not taking: Reported on 08/08/2019 06/10/19   [provider]  potassium chloride SA (KLOR-CON) 20 MEQ tablet Take 1 tablet (20 mEq total) by mouth 2 (two) times daily. Patient not taking: Reported on 07/05/2019 12/15/18   Petrucelli, Pleas Koch, PA-C  predniSONE (DELTASONE) 50 MG tablet 1 tablet PO daily Patient not taking: Reported on 08/08/2019 07/29/19   Glynn Octave, MD  Spacer/Aero Chamber Mouthpiece MISC 1 each by Does not apply route every 6 (six) hours as needed. 07/20/16   Johna Sheriff, MD  dicyclomine (BENTYL) 20 MG tablet Take 1 tablet (20 mg total) by mouth every 8 (eight) hours as needed for spasms. 12/15/18 06/07/19  Petrucelli, Pleas Koch, PA-C    Physical Exam:    Physical Exam: Vitals:   08/10/19 0031 08/10/19 0032 08/10/19 0044 08/10/19 0200  BP: 123/76     Pulse: (!) 138   (!) 125  Resp: 19     Temp: 98.5 F (36.9 C)  (!) 100.8 F (38.2 C)   TempSrc: Oral  Rectal   SpO2: 94%   97%  Weight:  119.3 kg    Height:  5\' 11"  (1.803 m)      Constitutional: NAD, calm, comfortable Vitals:   08/10/19 0031 08/10/19 0032 08/10/19 0044 08/10/19 0200  BP: 123/76     Pulse: (!) 138   (!) 125  Resp: 19     Temp: 98.5 F (36.9 C)  (!) 100.8 F (38.2 C)   TempSrc: Oral  Rectal   SpO2: 94%   97%  Weight:  119.3 kg    Height:  5\' 11"  (1.803 m)     Eyes: PERRL, lids and conjunctivae normal ENMT: Mucous membranes are moist.  Posterior pharynx clear of any exudate or lesions.Normal dentition.  Neck: normal, supple, no masses, no thyromegaly Respiratory: clear to auscultation bilaterally, no wheezing, right sided crepitations. Normal respiratory effort. No accessory muscle use.  Cardiovascular: Regular rate and rhythm, no murmurs / rubs / gallops. No extremity edema. 2+ pedal pulses. No carotid bruits.  Abdomen: no tenderness, no masses palpated. No hepatosplenomegaly. Bowel sounds positive.  Musculoskeletal: no clubbing / cyanosis. No joint deformity upper and lower extremities. Good ROM, no contractures. Normal muscle tone.  Skin: no rashes, lesions, ulcers. No induration Neurologic: CN 2-12 grossly intact. Sensation intact, DTR normal. Strength 5/5 in all 4.  Psychiatric: Normal judgment and insight. Alert and oriented x 3. Normal mood.    Decubitus Ulcers: Not present on admission Catheters and tubes: None  Data Review:    Labs on Admission: I have personally reviewed following labs and imaging studies  CBC: Recent Labs  Lab 08/10/19 0050  WBC 16.0*  NEUTROABS 11.6*  HGB 15.1  HCT 43.3  MCV 86.9  PLT 239   Basic Metabolic Panel: Recent Labs  Lab 08/10/19 0050  NA 139  K 3.6  CL 103  CO2 27  GLUCOSE 117*  BUN 14  CREATININE 0.97  CALCIUM 9.0   GFR: Estimated Creatinine Clearance: 154.3 mL/min (by C-G formula based on SCr of 0.97 mg/dL). Liver Function Tests: No results for input(s): AST, ALT, ALKPHOS, BILITOT, PROT, ALBUMIN in the last 168 hours. No results for input(s): LIPASE, AMYLASE in the last 168 hours. No results for input(s): AMMONIA in the last 168 hours. Coagulation Profile: No results for input(s): INR, PROTIME in the last 168 hours. Cardiac Enzymes: No results for input(s): CKTOTAL, CKMB, CKMBINDEX, TROPONINI in the last 168 hours. BNP (last 3 results) No results for input(s): PROBNP in the last 8760 hours. HbA1C: No results for input(s): HGBA1C in the last 72 hours.  CBG: No results for input(s): GLUCAP in the last 168 hours. Lipid Profile: No results for input(s): CHOL, HDL, LDLCALC, TRIG, CHOLHDL, LDLDIRECT in the last 72 hours. Thyroid Function Tests: No results for input(s): TSH, T4TOTAL, FREET4, T3FREE, THYROIDAB in the last 72 hours. Anemia Panel: No results for input(s): VITAMINB12, FOLATE, FERRITIN, TIBC, IRON, RETICCTPCT in the last 72 hours. Urine analysis:    Component Value Date/Time   COLORURINE  YELLOW 10/21/2018 2105   APPEARANCEUR CLEAR 10/21/2018 2105   LABSPEC 1.020 10/21/2018 2105   PHURINE 6.0 10/21/2018 2105   GLUCOSEU NEGATIVE 10/21/2018 2105   HGBUR NEGATIVE 10/21/2018 2105   BILIRUBINUR NEGATIVE 10/21/2018 2105   KETONESUR 5 (A) 10/21/2018 2105   PROTEINUR NEGATIVE 10/21/2018 2105   UROBILINOGEN 0.2 04/05/2010 2024   NITRITE NEGATIVE 10/21/2018 2105   LEUKOCYTESUR TRACE (A) 10/21/2018 2105     Imaging Results:      Radiological Exams on Admission: DG Chest 2 View  Result Date: 08/08/2019 CLINICAL DATA:  Cough, syncope EXAM: CHEST - 2 VIEW COMPARISON:  Chest radiograph dated 07/28/2019 FINDINGS: The heart size and mediastinal contours are within normal limits. Both lungs are clear. The visualized skeletal structures are unremarkable. IMPRESSION: No active cardiopulmonary disease. Electronically Signed   By: Romona Curls M.D.   On: 08/08/2019 15:06   CT Head Wo Contrast  Result Date: 08/08/2019 CLINICAL DATA:  Status post fall. EXAM: CT HEAD WITHOUT CONTRAST TECHNIQUE: Contiguous axial images were obtained from the base of the skull through the vertex without intravenous contrast. COMPARISON:  March 19, 2013 FINDINGS: Brain: No evidence of acute infarction, hemorrhage, hydrocephalus, extra-axial collection or mass lesion/mass effect. Vascular: No hyperdense vessel or unexpected calcification. Skull: Normal. Negative for fracture or focal lesion. Sinuses/Orbits: There is marked severity right maxillary sinus and right  ethmoid sinus mucosal thickening. Mild right-sided frontal sinus mucosal thickening is also noted. Other: None. IMPRESSION: 1. No acute intracranial abnormality. 2. Marked severity right maxillary sinus and right ethmoid sinus mucosal thickening. Mild right-sided frontal sinus mucosal thickening is also noted. Electronically Signed   By: Aram Candela M.D.   On: 08/08/2019 15:43   DG Chest Port 1 View  Result Date: 08/10/2019 CLINICAL DATA:  Shortness of breath, recently diagnosed with pneumonia EXAM: PORTABLE CHEST 1 VIEW COMPARISON:  Radiograph 08/08/2019, CT 07/29/2019 FINDINGS: Opacity predominantly seen in the superior segment left lower lobe on prior chest CT is less well visualized on this examination. However there is increasing reticulonodular opacities elsewhere in the right mid to upper lung which could reflect worsening infection elsewhere. No pneumothorax or effusion. Stable cardiomediastinal contours. No acute osseous or soft tissue abnormality. IMPRESSION: 1. Increasing reticulonodular opacities in the right mid to upper lung which could reflect worsening multifocal infection. Electronically Signed   By: Kreg Shropshire M.D.   On: 08/10/2019 01:27      [facility] Cyndie Chime MD [facility]  If 7PM-7AM, please contact night-coverage   08/10/2019, 2:37 AM

## 2019-08-10 NOTE — ED Provider Notes (Signed)
[facility] Provider Note   CSN: [id number] Arrival date & time: 08/10/19  0024   [facility]  History Chief Complaint  Patient presents with  . Shortness of Breath    Logan Dawson is a 24 y.o. male.  The history is provided by the patient.  Shortness of Breath Severity:  Severe Onset quality:  Gradual Duration:  1 day Timing:  Constant Progression:  Worsening Chronicity:  New Relieved by:  Nothing Worsened by:  Nothing Associated symptoms: cough and fever   Associated symptoms: no hemoptysis    Patient history of ADHD, asthma presents with cough and shortness of breath. Patient reports he has had a cough for up to a month, but over the past day he has had increasing cough and shortness of breath.  He reports feeling feverish.  He reports having posttussive emesis.  No hemoptysis Patient is a non-smoker. No recent travel.  He reports he was diagnosed with COVID-19 over 8 months ago but was asymptomatic.  He has not been vaccinated.   He reports recent ER evaluations for similar episodes but they are worsening Past Medical History:  Diagnosis Date  . ADHD (attention deficit hyperactivity disorder)   . Asthma   . Gastroenteritis   . Hypertension   . Left shoulder pain   . Meningitis   . Skull fracture (HCC)   . Subarachnoid hemorrhage [facility](HCC)     Patient Active Problem List   Diagnosis Date Noted  . Community acquired pneumonia 08/10/2019  . Sepsis due to pneumonia (HCC) 08/10/2019  . Gastroenteritis 04/14/2018  . Black-out (not amnesia) 11/27/2012  . Concussion with < 1 hr loss of consciousness 11/27/2012  . Hearing voices 11/27/2012  . Skull fracture with concussion (HCC) 11/27/2012  . Subarachnoid hemorrhage (HCC) 11/27/2012  . Unable to control anger 11/27/2012  . Abrasions of multiple sites 11/14/2012  . Linear skull fracture (HCC) 11/14/2012  . SAH (subarachnoid hemorrhage) (HCC) 11/14/2012  . Trauma 11/14/2012    Past Surgical History:    Procedure Laterality Date  . APPENDECTOMY    . CHOLECYSTECTOMY    . HERNIA REPAIR         Family History  Problem Relation Age of Onset  . Diabetes Mother   . COPD Mother   . Diabetes Father   . Hypertension Father   . Heart disease Father     Social History   Tobacco Use  . Smoking status: Never Smoker  . Smokeless tobacco: Never Used  Vaping Use  . Vaping Use: Never used  Substance Use Topics  . Alcohol use: Yes    Comment: occ.  . Drug use: No    Home Medications Prior to Admission medications   Medication Sig Start Date End Date Taking? Authorizing Provider  albuterol (VENTOLIN HFA) 108 (90 Base) MCG/ACT inhaler Inhale 1 puff into the lungs every 4 (four) hours as needed for wheezing.  06/10/19   [provider]  amoxicillin-clavulanate (AUGMENTIN) 875-125 MG tablet Take 1 tablet by mouth every 12 (twelve) hours. Patient not taking: Reported on 08/08/2019 07/29/19   Glynn Octaveancour, Stephen, MD  doxycycline (VIBRAMYCIN) 100 MG capsule Take 1 capsule (100 mg total) by mouth 2 (two) times daily. Patient not taking: Reported on 08/08/2019 07/29/19   Glynn Octaveancour, Stephen, MD  famotidine (PEPCID) 20 MG tablet Take 20 mg by mouth 2 (two) times daily.    [provider]  FLUoxetine (PROZAC) 20 MG capsule Take 20 mg by mouth daily. 06/10/19   [provider]  levofloxacin (LEVAQUIN) 750 MG tablet Take 1 tablet (750 mg total) by mouth daily for 4 days. 08/09/19 08/13/19  Terald Sleeper, MD  metoprolol succinate (TOPROL-XL) 25 MG 24 hr tablet Take 25 mg by mouth daily. 10/17/18   [provider]  ondansetron (ZOFRAN ODT) 4 MG disintegrating tablet Take 1 tablet (4 mg total) by mouth every 8 (eight) hours as needed for nausea. Patient not taking: Reported on 08/08/2019 07/05/19   Garlon Hatchet, PA-C  ondansetron (ZOFRAN) 4 MG tablet Take 1 tablet (4 mg total) by mouth every 6 (six) hours. Patient not taking: Reported on 07/05/2019 10/21/18   Gailen Shelter, PA   pantoprazole (PROTONIX) 40 MG tablet Take 40 mg by mouth in the morning and at bedtime. Patient not taking: Reported on 08/08/2019 06/10/19   [provider]  potassium chloride SA (KLOR-CON) 20 MEQ tablet Take 1 tablet (20 mEq total) by mouth 2 (two) times daily. Patient not taking: Reported on 07/05/2019 12/15/18   Petrucelli, Pleas Koch, PA-C  predniSONE (DELTASONE) 50 MG tablet 1 tablet PO daily Patient not taking: Reported on 08/08/2019 07/29/19   Glynn Octave, MD  Spacer/Aero Chamber Mouthpiece MISC 1 each by Does not apply route every 6 (six) hours as needed. 07/20/16   Johna Sheriff, MD  dicyclomine (BENTYL) 20 MG tablet Take 1 tablet (20 mg total) by mouth every 8 (eight) hours as needed for spasms. 12/15/18 06/07/19  Petrucelli, Samantha R, PA-C    Allergies    Sulfa antibiotics  Review of Systems   Review of Systems  Constitutional: Positive for fever.  Respiratory: Positive for cough and shortness of breath. Negative for hemoptysis.   All other systems reviewed and are negative.   Physical Exam Updated Vital Signs BP 126/88 (BP Location: Left Arm)   Pulse (!) 114   Temp 97.8 F (36.6 C) (Oral)   Resp 20   Ht 1.803 m (5\' 11" )   Wt 119.3 kg   SpO2 98%   BMI 36.68 kg/m   Physical Exam CONSTITUTIONAL: Well developed/well nourished HEAD: Normocephalic/atraumatic EYES: EOMI/PERRL ENMT: Mucous membranes moist NECK: supple no meningeal signs SPINE/BACK:entire spine nontender CV: S1/S2 noted, tachycardic, no loud murmur LUNGS: Mild tachypnea, coarse breath sounds bilaterally ABDOMEN: soft, nontender, no rebound or guarding, bowel sounds noted throughout abdomen GU:no cva tenderness NEURO: Pt is awake/alert/appropriate, moves all extremitiesx4.  No facial droop.  EXTREMITIES: pulses normal/equal, full ROM SKIN: warm, color normal PSYCH: no abnormalities of mood noted, alert and oriented to situation  ED Results / Procedures / Treatments   Labs (all labs  ordered are listed, but only abnormal results are displayed) Labs Reviewed  RESP PANEL BY RT PCR (RSV, FLU A&B, COVID) - Abnormal; Notable for the following components:      Result Value   Respiratory Syncytial Virus by PCR POSITIVE (*)    All other components within normal limits  BASIC METABOLIC PANEL - Abnormal; Notable for the following components:   Glucose, Bld 117 (*)    All other components within normal limits  CBC WITH DIFFERENTIAL/PLATELET - Abnormal; Notable for the following components:   WBC 16.0 (*)    Neutro Abs 11.6 (*)    Monocytes Absolute 1.5 (*)    Abs Immature Granulocytes 0.09 (*)    All other components within normal limits  SARS CORONAVIRUS 2 BY RT PCR (HOSPITAL ORDER, PERFORMED IN [facility])  CULTURE, BLOOD (ROUTINE X 2)  CULTURE, BLOOD (ROUTINE X  2)  LACTIC ACID, PLASMA  BASIC METABOLIC PANEL  CBC  HIV ANTIBODY (ROUTINE TESTING W REFLEX)    EKG EKG Interpretation  Date/Time:  Monday August 10 2019 00:29:33 EDT Ventricular Rate:  129 PR Interval:    QRS Duration: 89 QT Interval:  278 QTC Calculation: 408 R Axis:   64 Text Interpretation: Sinus tachycardia Confirmed by Zadie Rhine ([id number]) on 08/10/2019 12:41:47 AM   Radiology DG Chest 2 View  Result Date: 08/08/2019 CLINICAL DATA:  Cough, syncope EXAM: CHEST - 2 VIEW COMPARISON:  Chest radiograph dated 07/28/2019 FINDINGS: The heart size and mediastinal contours are within normal limits. Both lungs are clear. The visualized skeletal structures are unremarkable. IMPRESSION: No active cardiopulmonary disease. Electronically Signed   By: Romona Curls M.D.   On: 08/08/2019 15:06   CT Head Wo Contrast  Result Date: 08/08/2019 CLINICAL DATA:  Status post fall. EXAM: CT HEAD WITHOUT CONTRAST TECHNIQUE: Contiguous axial images were obtained from the base of the skull through the vertex without intravenous contrast. COMPARISON:  March 19, 2013 FINDINGS: Brain: No evidence of acute  infarction, hemorrhage, hydrocephalus, extra-axial collection or mass lesion/mass effect. Vascular: No hyperdense vessel or unexpected calcification. Skull: Normal. Negative for fracture or focal lesion. Sinuses/Orbits: There is marked severity right maxillary sinus and right ethmoid sinus mucosal thickening. Mild right-sided frontal sinus mucosal thickening is also noted. Other: None. IMPRESSION: 1. No acute intracranial abnormality. 2. Marked severity right maxillary sinus and right ethmoid sinus mucosal thickening. Mild right-sided frontal sinus mucosal thickening is also noted. Electronically Signed   By: Aram Candela M.D.   On: 08/08/2019 15:43   DG Chest Port 1 View  Result Date: 08/10/2019 CLINICAL DATA:  Shortness of breath, recently diagnosed with pneumonia EXAM: PORTABLE CHEST 1 VIEW COMPARISON:  Radiograph 08/08/2019, CT 07/29/2019 FINDINGS: Opacity predominantly seen in the superior segment left lower lobe on prior chest CT is less well visualized on this examination. However there is increasing reticulonodular opacities elsewhere in the right mid to upper lung which could reflect worsening infection elsewhere. No pneumothorax or effusion. Stable cardiomediastinal contours. No acute osseous or soft tissue abnormality. IMPRESSION: 1. Increasing reticulonodular opacities in the right mid to upper lung which could reflect worsening multifocal infection. Electronically Signed   By: Kreg Shropshire M.D.   On: 08/10/2019 01:27    Procedures .Critical Care Performed by: Zadie Rhine, MD Authorized by: Zadie Rhine, MD   Critical care provider statement:    Critical care time (minutes):  50   Critical care start time:  08/10/2019 12:40 AM   Critical care end time:  08/10/2019 1:30 AM   Critical care time was exclusive of:  Separately billable procedures and treating other patients   Critical care was necessary to treat or prevent imminent or life-threatening deterioration of the following  conditions:  Respiratory failure, sepsis and dehydration   Critical care was time spent personally by me on the following activities:  Ordering and performing treatments and interventions, ordering and review of laboratory studies, ordering and review of radiographic studies, pulse oximetry, re-evaluation of patient's condition, examination of patient, evaluation of patient's response to treatment, discussions with consultants and development of treatment plan with patient or surrogate   I assumed direction of critical care for this patient from another provider in my specialty: no      Medications Ordered in ED Medications  cefTRIAXone (ROCEPHIN) 2 g in sodium chloride 0.9 % 100 mL IVPB (0 g Intravenous Stopped 08/10/19 0312)  azithromycin (ZITHROMAX)  500 mg in sodium chloride 0.9 % 250 mL IVPB (500 mg Intravenous Transfusing/Transfer 08/10/19 0317)  lactated ringers bolus 1,000 mL (1,000 mLs Intravenous Transfusing/Transfer 08/10/19 0317)    And  lactated ringers bolus 1,000 mL (has no administration in time range)    And  lactated ringers bolus 400 mL (has no administration in time range)  metoprolol succinate (TOPROL-XL) 24 hr tablet 25 mg (has no administration in time range)  FLUoxetine (PROZAC) capsule 20 mg (has no administration in time range)  famotidine (PEPCID) tablet 20 mg (has no administration in time range)  lactated ringers infusion (has no administration in time range)  sodium chloride flush (NS) 0.9 % injection 3 mL (3 mLs Intravenous Not Given 08/10/19 0327)  sodium chloride flush (NS) 0.9 % injection 3 mL (has no administration in time range)  0.9 %  sodium chloride infusion (has no administration in time range)  polyethylene glycol (MIRALAX / GLYCOLAX) packet 17 g (has no administration in time range)  bisacodyl (DULCOLAX) suppository 10 mg (has no administration in time range)  ondansetron (ZOFRAN) tablet 4 mg (has no administration in time range)    Or  ondansetron (ZOFRAN)  injection 4 mg (has no administration in time range)  acetaminophen (TYLENOL) tablet 650 mg (650 mg Oral Given 08/10/19 0103)  ketorolac (TORADOL) 30 MG/ML injection 30 mg (30 mg Intravenous Given 08/10/19 0250)    ED Course  I have reviewed the triage vital signs and the nursing notes.  Pertinent labs & imaging results that were available during my care of the patient were reviewed by me and considered in my medical decision making (see chart for details).    MDM Rules/Calculators/A&P                          Patient presents for continued cough and shortness of breath.  Patient has had multiple ER visits in the past month.  Patient had 5 ER visits in the month of June at an outside hospital, 2 of which were concerning for pneumonia.  Patient had a visit to the ER on June 23 for concern for pneumonia @ [facility].  Patient was seen recently after a fall and also diagnosed with sinusitis.  It appears that at least a 1 ER visit he never got his prescriptions filled.  He now has continued cough and shortness of breath.  Patient is febrile/tachycardic with likely early sepsis.  COVID-19 testing was negative Due to worsening cough and shortness of breath, I feel patient should be admitted to the hospital.  He can receive IV antibiotics with blood cultures pending.  Viral panel is pending at this time.  Discussed with hospitalist for admission   This patient presents to the ED for concern of cough/shortness of breath, this involves an extensive number of treatment options, and is a complaint that carries with it a high risk of complications and morbidity.  The differential diagnosis includes pneumonia, pulmonary embolism, COVID-19, CHF   Lab Tests:   I Ordered, reviewed, and interpreted labs, which included lactic acid, electrolytes, COVID-19 testing, complete blood count  Medicines ordered:   I ordered medication IV fluids and antibiotics and Toradol for for fever and pneumonia,  dehydration  Imaging Studies ordered:   I ordered imaging studies which included chest x-ray   I independently visualized and interpreted imaging which showed pneumonia  Additional history obtained:    Previous records obtained and reviewed   Consultations Obtained:  I consulted [facility] hospitalist Dr. Palma Holter and discussed lab and imaging findings  Reevaluation:  After the interventions stated above, I reevaluated the patient and found patient is stable  Critical Interventions:  . IV fluids and IV antibiotics    JAYDYN BOZZO was evaluated in Emergency Department on 08/10/2019 for the symptoms described in the history of present illness. He was evaluated in the context of the global COVID-19 pandemic, which necessitated consideration that the patient might be at risk for infection with the SARS-CoV-2 virus that causes COVID-19. Institutional protocols and algorithms that pertain to the evaluation of patients at risk for COVID-19 are in a state of rapid change based on information released by regulatory bodies including the CDC and federal and state organizations. These policies and algorithms were followed during the patient's care in the ED.  Final Clinical Impression(s) / ED Diagnoses Final diagnoses:  Multifocal pneumonia    Rx / DC Orders ED Discharge Orders    None       Zadie Rhine, MD 08/10/19 [phone]

## 2019-08-11 DIAGNOSIS — J189 Pneumonia, unspecified organism: Secondary | ICD-10-CM | POA: Diagnosis present

## 2019-08-11 LAB — COMPREHENSIVE METABOLIC PANEL
ALT: 39 U/L (ref 0–44)
AST: 70 U/L — ABNORMAL HIGH (ref 15–41)
Albumin: 3.5 g/dL (ref 3.5–5.0)
Alkaline Phosphatase: 59 U/L (ref 38–126)
Anion gap: 13 (ref 5–15)
BUN: 9 mg/dL (ref 6–20)
CO2: 24 mmol/L (ref 22–32)
Calcium: 8.4 mg/dL — ABNORMAL LOW (ref 8.9–10.3)
Chloride: 100 mmol/L (ref 98–111)
Creatinine, Ser: 0.85 mg/dL (ref 0.61–1.24)
GFR calc Af Amer: >60 mL/min (ref >60)
GFR calc non Af Amer: >60 mL/min (ref >60)
Glucose, Bld: 122 mg/dL — ABNORMAL HIGH (ref 70–99)
Potassium: 3.3 mmol/L — ABNORMAL LOW (ref 3.5–5.1)
Sodium: 137 mmol/L (ref 135–145)
Total Bilirubin: 0.6 mg/dL (ref 0.3–1.2)
Total Protein: 6.6 g/dL (ref 6.5–8.1)

## 2019-08-11 LAB — CBC WITH DIFFERENTIAL/PLATELET
Abs Immature Granulocytes: 0.05 10*3/uL (ref 0.00–0.07)
Basophils Absolute: 0.1 10*3/uL (ref 0.0–0.1)
Basophils Relative: 1 %
Eosinophils Absolute: 0.1 10*3/uL (ref 0.0–0.5)
Eosinophils Relative: 1 %
HCT: 40.6 % (ref 39.0–52.0)
Hemoglobin: 13.7 g/dL (ref 13.0–17.0)
Immature Granulocytes: 0 %
Lymphocytes Relative: 20 %
Lymphs Abs: 2.7 10*3/uL (ref 0.7–4.0)
MCH: 29.8 pg (ref 26.0–34.0)
MCHC: 33.7 g/dL (ref 30.0–36.0)
MCV: 88.3 fL (ref 80.0–100.0)
Monocytes Absolute: 1.2 10*3/uL — ABNORMAL HIGH (ref 0.1–1.0)
Monocytes Relative: 9 %
Neutro Abs: 9.3 10*3/uL — ABNORMAL HIGH (ref 1.7–7.7)
Neutrophils Relative %: 69 %
Platelets: 208 10*3/uL (ref 150–400)
RBC: 4.6 MIL/uL (ref 4.22–5.81)
RDW: 12.6 % (ref 11.5–15.5)
WBC: 13.4 10*3/uL — ABNORMAL HIGH (ref 4.0–10.5)
nRBC: 0 % (ref 0.0–0.2)

## 2019-08-11 LAB — MAGNESIUM: Magnesium: 1.7 mg/dL (ref 1.7–2.4)

## 2019-08-11 MED ORDER — LIDOCAINE 5 % EX PTCH
1.0000 | MEDICATED_PATCH | CUTANEOUS | Status: DC
  Administered 2019-08-11 – 2019-08-12 (×2): 1 via TRANSDERMAL
  Filled 2019-08-11 (×2): qty 1

## 2019-08-11 MED ORDER — ENOXAPARIN SODIUM 60 MG/0.6ML ~~LOC~~ SOLN: 60.0000 mg | SUBCUTANEOUS | Status: DC

## 2019-08-11 MED ORDER — BENZONATATE 100 MG PO CAPS
200.0000 mg | ORAL_CAPSULE | Freq: Three times a day (TID) | ORAL | Status: DC
  Administered 2019-08-11 – 2019-08-12 (×4): 200 mg via ORAL
  Filled 2019-08-11 (×4): qty 2

## 2019-08-11 MED ORDER — POTASSIUM CHLORIDE CRYS ER 20 MEQ PO TBCR
40.0000 meq | EXTENDED_RELEASE_TABLET | Freq: Two times a day (BID) | ORAL | Status: AC
  Administered 2019-08-11 (×2): 40 meq via ORAL
  Filled 2019-08-11 (×2): qty 2

## 2019-08-11 MED ORDER — METHYLPREDNISOLONE SODIUM SUCC 125 MG IJ SOLR
60.0000 mg | Freq: Two times a day (BID) | INTRAMUSCULAR | Status: AC
  Administered 2019-08-11 – 2019-08-12 (×2): 60 mg via INTRAVENOUS
  Filled 2019-08-11 (×2): qty 2

## 2019-08-11 MED ORDER — MAGNESIUM SULFATE 2 GM/50ML IV SOLN
2.0000 g | Freq: Once | INTRAVENOUS | Status: AC
  Administered 2019-08-11: 2 g via INTRAVENOUS
  Filled 2019-08-11: qty 50

## 2019-08-11 MED ORDER — CYCLOBENZAPRINE HCL 10 MG PO TABS
10.0000 mg | ORAL_TABLET | Freq: Every day | ORAL | Status: AC
  Administered 2019-08-11: 10 mg via ORAL
  Filled 2019-08-11: qty 1

## 2019-08-11 MED ORDER — ACETAMINOPHEN 325 MG PO TABS
650.0000 mg | ORAL_TABLET | Freq: Four times a day (QID) | ORAL | Status: DC | PRN
  Administered 2019-08-11 (×2): 650 mg via ORAL
  Filled 2019-08-11 (×3): qty 2

## 2019-08-11 NOTE — Progress Notes (Signed)
PROGRESS NOTE  Logan MowJacob C Dawson FAO:[id number]:[id number] DOB: 1995-08-27 DOA: 08/10/2019 [facility]  HPI/Recap of past 24 hours: Logan HaitJacob C Bowmanis a 23 y.o.malewith medical history significantfor hypertension, asthma, depression, GERD, history of abdominal surgeries, skull fracture with subdural hematoma 2015 who presented to the ER with persistent productive cough and shortness of breath.   Chest x-ray suggestive of multifocal pneumonia.  Admitted for community-acquired pneumonia.  08/11/19: Seen and examined at his bedside.  Reports worsening cough.  We will add Tessalon Perles, IV Solu-Medrol, and continue bronchodilators.    Assessment/Plan: Principal Problem:   Community acquired pneumonia Active Problems:   Sepsis due to pneumonia (HCC)   CAP (community acquired pneumonia)  Sepsis secondary to community-acquired pneumonia superimposed by viral infection RSV. Patient has been having cough for about a month and a half.  [facility] and treated with 5 days of oral azithromycin.  [facility] with persistent symptoms and was prescribed Augmentin and doxycycline however never actually took the medication.  Return back to ER on 08/08/2019 and was prescribed Levaquin orally but has only taken 2 doses so far.  Patient was referred for admission to hospital service for presumed failure of oral antibiotics however it seems he had some issues with compliance.  Nonetheless patient presented with a temperature of 100, mild tachycardia, leukocytosis to 16K.  -Continue empiric antibiotics Rocephin and azithromycin -Sputum culture pending -Blood cultures negative to date -CRP elevated -Start IV Solu-Medrol 60 mg twice daily -Start scheduled Tessalon Perles 3 times daily x3 days -Resume prednisone at discharge and taper off  Other Active Problems: Mild intermittent asthma with acute exacerbation -Continue bronchodilators -Continue IV  steroids -Maintain O2 saturation greater than 90%  Hypokalemia Serum potassium 3.3 Repleted orally Goal serum potassium 4.0  Hypomagnesemia Serum magnesium 1.7 Repleted intravenously Goal serum magnesium 2.0  Hypertension Blood pressure is at goal -Continue metoprolol  Chronic anxiety/depression Stable Continue Prozac  GERD Stable Continue PPI    DVT prophylaxis:  Subcu Lovenox daily Code Status:  Full code   Consults called: N/A   Status is: Inpatient    Dispo:  Patient From: Home  Planned Disposition: Home  Expected discharge date: 08/12/19  Medically stable for discharge: No.  Persistent symptomatology with worsening cough.         Objective: Vitals:   08/10/19 2302 08/11/19 0239 08/11/19 0549 08/11/19 0759  BP: 103/67  102/70   Pulse: (!) 107  (!) 102   Resp: 20  18   Temp: 98.7 F (37.1 C)  98.3 F (36.8 C)   TempSrc: Oral  Oral   SpO2: 98% 97% 97% 92%  Weight:   126.3 kg   Height:        Intake/Output Summary (Last 24 hours) at 08/11/2019 1216 Last data filed at 08/11/2019 [id number] Gross per 24 hour  Intake 2854.24 ml  Output 950 ml  Net 1904.24 ml   Filed Weights   08/10/19 0032 08/10/19 0408 08/11/19 0549  Weight: 119.3 kg 126.7 kg 126.3 kg    Exam:  . General: 24 y.o. year-old male well developed well nourished in no acute distress.  Alert and oriented x3. . Cardiovascular: Regular rate and rhythm with no rubs or gallops.  No thyromegaly or JVD noted.   Marland Kitchen. Respiratory: Diffuse wheezing bilaterally.  Poor inspiratory effort. . Abdomen: Soft nontender nondistended with normal bowel sounds x4 quadrants. . Musculoskeletal: No lower extremity edema. 2/4 pulses in all 4  extremities. Marland Kitchen Psychiatry: Mood is appropriate for condition and setting   Data Reviewed: CBC: Recent Labs  Lab 08/10/19 0050 08/10/19 0455 08/11/19 0523  WBC 16.0* 14.2* 13.4*  NEUTROABS 11.6*  --  9.3*  HGB 15.1 13.3 13.7  HCT 43.3 39.3 40.6  MCV 86.9  88.9 88.3  PLT 239 214 208   Basic Metabolic Panel: Recent Labs  Lab 08/10/19 0050 08/10/19 0455 08/11/19 0523  NA 139 136 137  K 3.6 3.3* 3.3*  CL 103 103 100  CO2 27 24 24   GLUCOSE 117* 115* 122*  BUN 14 15 9   CREATININE 0.97 0.94 0.85  CALCIUM 9.0 8.2* 8.4*  MG  --   --  1.7   GFR: Estimated Creatinine Clearance: 181.4 mL/min (by C-G formula based on SCr of 0.85 mg/dL). Liver Function Tests: Recent Labs  Lab 08/11/19 0523  AST 70*  ALT 39  ALKPHOS 59  BILITOT 0.6  PROT 6.6  ALBUMIN 3.5   No results for input(s): LIPASE, AMYLASE in the last 168 hours. No results for input(s): AMMONIA in the last 168 hours. Coagulation Profile: No results for input(s): INR, PROTIME in the last 168 hours. Cardiac Enzymes: No results for input(s): CKTOTAL, CKMB, CKMBINDEX, TROPONINI in the last 168 hours. BNP (last 3 results) No results for input(s): PROBNP in the last 8760 hours. HbA1C: No results for input(s): HGBA1C in the last 72 hours. CBG: No results for input(s): GLUCAP in the last 168 hours. Lipid Profile: No results for input(s): CHOL, HDL, LDLCALC, TRIG, CHOLHDL, LDLDIRECT in the last 72 hours. Thyroid Function Tests: No results for input(s): TSH, T4TOTAL, FREET4, T3FREE, THYROIDAB in the last 72 hours. Anemia Panel: No results for input(s): VITAMINB12, FOLATE, FERRITIN, TIBC, IRON, RETICCTPCT in the last 72 hours. Urine analysis:    Component Value Date/Time   COLORURINE YELLOW 10/21/2018 2105   APPEARANCEUR CLEAR 10/21/2018 2105   LABSPEC 1.020 10/21/2018 2105   PHURINE 6.0 10/21/2018 2105   GLUCOSEU NEGATIVE 10/21/2018 2105   HGBUR NEGATIVE 10/21/2018 2105   BILIRUBINUR NEGATIVE 10/21/2018 2105   KETONESUR 5 (A) 10/21/2018 2105   PROTEINUR NEGATIVE 10/21/2018 2105   UROBILINOGEN 0.2 04/05/2010 2024   NITRITE NEGATIVE 10/21/2018 2105   LEUKOCYTESUR TRACE (A) 10/21/2018 2105   Sepsis Labs: @LABRCNTIP (procalcitonin:4,lacticidven:4)  ) Recent Results (from  the past 240 hour(s))  SARS Coronavirus 2 by RT PCR (hospital order, performed in [facility] hospital lab) Nasopharyngeal Nasopharyngeal Swab     Status: None   Collection Time: 08/10/19 12:50 AM   Specimen: Nasopharyngeal Swab  Result Value Ref Range Status   SARS Coronavirus 2 NEGATIVE NEGATIVE Final    Comment: (NOTE) SARS-CoV-2 target nucleic acids are NOT DETECTED.  The SARS-CoV-2 RNA is generally detectable in upper and lower respiratory specimens during the acute phase of infection. The lowest concentration of SARS-CoV-2 viral copies this assay can detect is 250 copies / mL. A negative result does not preclude SARS-CoV-2 infection and should not be used as the sole basis for treatment or other patient management decisions.  A negative result may occur with improper specimen collection / handling, submission of specimen other than nasopharyngeal swab, presence of viral mutation(s) within the areas targeted by this assay, and inadequate number of viral copies (<250 copies / mL). A negative result must be combined with clinical observations, patient history, and epidemiological information.  Fact Sheet for Patients:    Fact Sheet for Healthcare Providers: [facility]  This test is not yet approved or  cleared by the [place] and has been authorized for detection and/or diagnosis of SARS-CoV-2 by FDA under an Emergency Use Authorization (EUA).  This EUA will remain in effect (meaning this test can be used) for the duration of the COVID-19 declaration under Section 564(b)(1) of the Act, 21 U.S.C. section 360bbb-3(b)(1), unless the authorization is terminated or revoked sooner.  Performed at [facility], [address], [place], [place] [address]   Blood culture (routine x 2)     Status: None (Preliminary result)   Collection Time: 08/10/19  2:05 AM   Specimen: Right Antecubital; Blood  Result  Value Ref Range Status   Specimen Description RIGHT ANTECUBITAL  Final   Special Requests   Final    BOTTLES DRAWN AEROBIC AND ANAEROBIC Blood Culture adequate volume   Culture   Final    NO GROWTH 1 DAY Performed at [facility], [address], [place], [place] [address]    Report Status PENDING  Incomplete  Blood culture (routine x 2)     Status: None (Preliminary result)   Collection Time: 08/10/19  2:10 AM   Specimen: BLOOD LEFT FOREARM  Result Value Ref Range Status   Specimen Description BLOOD LEFT FOREARM  Final   Special Requests   Final    BOTTLES DRAWN AEROBIC AND ANAEROBIC Blood Culture adequate volume   Culture   Final    NO GROWTH 1 DAY Performed at [facility], [address] [place], [place] [address]    Report Status PENDING  Incomplete  Resp Panel by RT PCR (RSV, Flu A&B, Covid) - Nasopharyngeal Swab     Status: Abnormal   Collection Time: 08/10/19  2:21 AM   Specimen: Nasopharyngeal Swab  Result Value Ref Range Status   SARS Coronavirus 2 by RT PCR NEGATIVE NEGATIVE Final    Comment: (NOTE) SARS-CoV-2 target nucleic acids are NOT DETECTED.  The SARS-CoV-2 RNA is generally detectable in upper respiratoy specimens during the acute phase of infection. The lowest concentration of SARS-CoV-2 viral copies this assay can detect is 131 copies/mL. A negative result does not preclude SARS-Cov-2 infection and should not be used as the sole basis for treatment or other patient management decisions. A negative result may occur with  improper specimen collection/handling, submission of specimen other than nasopharyngeal swab, presence of viral mutation(s) within the areas targeted by this assay, and inadequate number of viral copies (<131 copies/mL). A negative result must be combined with clinical observations, patient history, and epidemiological information. The expected result is Negative.  Fact Sheet for Patients:  https://www.moore.com/  Fact  Sheet for Healthcare Providers:  https://www.young.biz/  This test is no t yet approved or cleared by the [place] FDA and  has been authorized for detection and/or diagnosis of SARS-CoV-2 by FDA under an Emergency Use Authorization (EUA). This EUA will remain  in effect (meaning this test can be used) for the duration of the COVID-19 declaration under Section 564(b)(1) of the Act, 21 U.S.C. section 360bbb-3(b)(1), unless the authorization is terminated or revoked sooner.     Influenza A by PCR NEGATIVE NEGATIVE Final   Influenza B by PCR NEGATIVE NEGATIVE Final    Comment: (NOTE) The Xpert Xpress SARS-CoV-2/FLU/RSV assay is intended as an aid in  the diagnosis of influenza from Nasopharyngeal swab specimens and  should not be used as a sole basis for treatment. Nasal washings and  aspirates are unacceptable for Xpert Xpress SARS-CoV-2/FLU/RSV  testing.  Fact Sheet for Patients: https://www.moore.com/  Fact  Sheet for Healthcare Providers: https://www.young.biz/  This test is not yet approved or cleared by the [place] and  has been authorized for detection and/or diagnosis of SARS-CoV-2 by  FDA under an Emergency Use Authorization (EUA). This EUA will remain  in effect (meaning this test can be used) for the duration of the  Covid-19 declaration under Section 564(b)(1) of the Act, 21  U.S.C. section 360bbb-3(b)(1), unless the authorization is  terminated or revoked.    Respiratory Syncytial Virus by PCR POSITIVE (A) NEGATIVE Final    Comment: (NOTE) Fact Sheet for Patients: https://www.moore.com/  Fact Sheet for Healthcare Providers: https://www.young.biz/  This test is not yet approved or cleared by the [place] FDA and  has been authorized for detection and/or diagnosis of SARS-CoV-2 by  FDA under an Emergency Use Authorization (EUA). This EUA will remain    in effect (meaning this test can be used) for the duration of the  COVID-19 declaration under Section 564(b)(1) of the Act, 21 U.S.C.  section 360bbb-3(b)(1), unless the authorization is terminated or  revoked. Performed at [facility], [address], [place], [place] [address]       Studies: No results found.  Scheduled Meds: . famotidine  20 mg Oral BID  . FLUoxetine  20 mg Oral Daily  . ipratropium-albuterol  3 mL Nebulization TID  . lidocaine  1 patch Transdermal Q24H  . metoprolol succinate  25 mg Oral Daily  . potassium chloride  40 mEq Oral BID WC  . predniSONE  50 mg Oral Q breakfast  . sodium chloride flush  3 mL Intravenous Q12H    Continuous Infusions: . sodium chloride    . azithromycin 500 mg (08/10/19 2205)  . cefTRIAXone (ROCEPHIN)  IV 2 g (08/10/19 2104)  . lactated ringers 50 mL/hr at 08/11/19 0425     LOS: 0 days     Darlin Drop, MD [facility] Pager [phone]  If 7PM-7AM, please contact night-coverage [email or web address] Password [email or web address] 08/11/2019, 12:16 PM

## 2019-08-11 NOTE — Progress Notes (Signed)
Patient has a history of falls.  Patient states he is able to walk around safely and does not want bed alarm at this time.  Patient is alert and oriented and independent.

## 2019-08-12 DIAGNOSIS — A419 Sepsis, unspecified organism: Principal | ICD-10-CM

## 2019-08-12 LAB — COMPREHENSIVE METABOLIC PANEL
ALT: 43 U/L (ref 0–44)
AST: 63 U/L — ABNORMAL HIGH (ref 15–41)
Albumin: 3.7 g/dL (ref 3.5–5.0)
Alkaline Phosphatase: 67 U/L (ref 38–126)
Anion gap: 9 (ref 5–15)
BUN: 12 mg/dL (ref 6–20)
CO2: 25 mmol/L (ref 22–32)
Calcium: 9.1 mg/dL (ref 8.9–10.3)
Chloride: 102 mmol/L (ref 98–111)
Creatinine, Ser: 0.73 mg/dL (ref 0.61–1.24)
GFR calc Af Amer: >60 mL/min (ref >60)
GFR calc non Af Amer: >60 mL/min (ref >60)
Glucose, Bld: 130 mg/dL — ABNORMAL HIGH (ref 70–99)
Potassium: 4.4 mmol/L (ref 3.5–5.1)
Sodium: 136 mmol/L (ref 135–145)
Total Bilirubin: 0.5 mg/dL (ref 0.3–1.2)
Total Protein: 7.4 g/dL (ref 6.5–8.1)

## 2019-08-12 LAB — CBC WITH DIFFERENTIAL/PLATELET
Abs Immature Granulocytes: 0.1 10*3/uL — ABNORMAL HIGH (ref 0.00–0.07)
Basophils Absolute: 0 10*3/uL (ref 0.0–0.1)
Basophils Relative: 0 %
Eosinophils Absolute: 0 10*3/uL (ref 0.0–0.5)
Eosinophils Relative: 0 %
HCT: 44.8 % (ref 39.0–52.0)
Hemoglobin: 15.1 g/dL (ref 13.0–17.0)
Immature Granulocytes: 1 %
Lymphocytes Relative: 11 %
Lymphs Abs: 1.7 10*3/uL (ref 0.7–4.0)
MCH: 29.8 pg (ref 26.0–34.0)
MCHC: 33.7 g/dL (ref 30.0–36.0)
MCV: 88.4 fL (ref 80.0–100.0)
Monocytes Absolute: 0.7 10*3/uL (ref 0.1–1.0)
Monocytes Relative: 4 %
Neutro Abs: 13.3 10*3/uL — ABNORMAL HIGH (ref 1.7–7.7)
Neutrophils Relative %: 84 %
Platelets: 280 10*3/uL (ref 150–400)
RBC: 5.07 MIL/uL (ref 4.22–5.81)
RDW: 12.4 % (ref 11.5–15.5)
WBC: 15.8 10*3/uL — ABNORMAL HIGH (ref 4.0–10.5)
nRBC: 0 % (ref 0.0–0.2)

## 2019-08-12 LAB — MAGNESIUM: Magnesium: 2.3 mg/dL (ref 1.7–2.4)

## 2019-08-12 MED ORDER — ALBUTEROL SULFATE HFA 108 (90 BASE) MCG/ACT IN AERS: 1.0000 | INHALATION_SPRAY | RESPIRATORY_TRACT | 2 refills | Status: DC | PRN

## 2019-08-12 MED ORDER — PREDNISONE 20 MG PO TABS
ORAL_TABLET | ORAL | 0 refills | Status: DC
Start: 2019-08-13 — End: 2020-01-26

## 2019-08-12 MED ORDER — PANTOPRAZOLE SODIUM 40 MG PO TBEC: 40.0000 mg | DELAYED_RELEASE_TABLET | Freq: Every day | ORAL | Status: DC

## 2019-08-12 MED ORDER — DEXTROMETHORPHAN POLISTIREX ER 30 MG/5ML PO SUER: 60.0000 mg | Freq: Two times a day (BID) | ORAL | 0 refills | Status: DC | PRN

## 2019-08-12 MED ORDER — METOPROLOL SUCCINATE ER 25 MG PO TB24: 25.0000 mg | ORAL_TABLET | Freq: Every day | ORAL | 0 refills | Status: DC

## 2019-08-12 MED ORDER — DOXYCYCLINE HYCLATE 100 MG PO CAPS
100.0000 mg | ORAL_CAPSULE | Freq: Two times a day (BID) | ORAL | 0 refills | Status: AC
Start: 2019-08-12 — End: 2019-08-15

## 2019-08-12 NOTE — Discharge Instructions (Signed)
Respiratory Syncytial Virus, Adult Respiratory syncytial virus (RSV) infection is an infection caused by RSV, a common virus. This virus is similar to viruses that cause the common cold and the flu. RSV infection can affect the nose, throat, windpipe, and lungs (respiratory system). When the infection is severe, it can cause:  Bronchiolitis. This condition causes inflammation of the air passages in the lungs (bronchioles).  Pneumonia. This condition causes inflammation of the air sacs in the lungs. RSV infection spreads from person to person (is contagious) through droplets from coughs and sneezes (respiratory secretions). This condition is rarely serious when it occurs in adults. What are the causes? This condition is caused by contact with the RSV. This can happen by:  Breathing respiratory secretions that fly through the air.  Touching something that has been exposed to the virus (is contaminated) and then touching your mouth, nose, or eyes.  Coming in close contact with someone who has this infection. This may happen if you: ? Hug or kiss. ? Shake or hold hands. ? Eat or drink using the same dishes or utensils. What increases the risk? The following factors may make you more likely to develop this condition:  Being 65 years of age or older.  Having certain health conditions, including: ? A long-term (chronic) lung condition, such as chronic obstructive pulmonary disease (COPD). ? An immune system that is weak. This is your body's defense system. ? Down syndrome. ? Heart disease.  Working in a hospital or other health care facility.  Living in a long-term health care facility. RVS infections are most common from the months of November to April. But they can happen any time of year. What are the signs or symptoms? Symptoms of this condition include:  Having a runny nose.  Coughing. You may have a cough that brings up mucus (productive cough).  Sneezing.  Having a  fever.  Wanting to eat less than usual.  Breathing loudly (wheezing).  Having shortness of breath.  Having fluid build up in the lungs (respiratory distress). How is this diagnosed? This condition may be diagnosed based on:  Your symptoms.  Your medical history.  A physical exam.  A chest X-ray to rule out pneumonia.  Blood tests or tests of mucus from your lungs (sputum). These tests may be done for older adults.  A test of a sample of your respiratory secretions. How is this treated? In most cases, the RSV infection will go away after 1-2 weeks of caring for yourself at home.  Sometimes, RSV infection is severe and causes pneumonia. If you develop pneumonia, you may need to be treated in the hospital. You may be given:  Oxygen therapy.  Antibiotic medicine.  Medicines to open your bronchioles (bronchodilators). Follow these instructions at home: Medicines  Take over-the-counter and prescription medicines only as told by your health care provider.  If you were prescribed an antibiotic medicine, take it as told by your health care provider. Do not stop using the antibiotic even if you start to feel better. Lifestyle  Eat a healthy diet.  Do not drink alcohol.  Do not use any products that contain nicotine or tobacco, such as cigarettes, e-cigarettes, and chewing tobacco. If you need help quitting, ask your health care provider.  Rest at home until your symptoms go away.  Return to your normal activities as told by your health care provider. Ask your health care provider what activities are safe for you. General instructions  Drink enough fluid to keep your   urine pale yellow.  Gargle with a salt-water mixture 3-4 times a day or as needed. To make a salt-water mixture, completely dissolve -1 tsp (3-6 g) of salt in 1 cup (237 mL) of warm water.  Keep all follow-up visits as told by your health care provider. This is important. How is this prevented? To prevent  catching and spreading RSV:  Wash your hands often with soap and water. If soap and water are not available, use hand sanitizer. This liquid kills germs. Do not touch your face without first cleaning your hands.  Stay home if you have symptoms of the common cold or the flu.  Cover your nose and mouth when you cough or sneeze.  Avoid large groups of people.  Keep a safe distance from people who are coughing or sneezing. Contact a health care provider if:  Your symptoms get worse or have not changed after 2 weeks.  You have: ? A fever. ? Hot flashes, sweating, or chills that keep happening. ? A cough that brings up much more mucus than usual. ? A cough that brings up blood. ? Respiratory distress that gets worse.  You feel: ? Very tired (lethargic). ? Confused. Get help right away if you have:  Respiratory distress that becomes severe.  Loss of consciousness. These symptoms may represent a serious problem that is an emergency. Do not wait to see if the symptoms will go away. Get medical help right away. Call your local emergency services (911 in the [place] Do not drive yourself to the hospital. This information is not intended to replace advice given to you by your health care provider. Make sure you discuss any questions you have with your health care provider. Document Revised: 01/24/2018 Document Reviewed: 07/05/2015 Elsevier Patient Education  2020 Elsevier Inc.    IMPORTANT INFORMATION: PAY CLOSE ATTENTION   PHYSICIAN DISCHARGE INSTRUCTIONS  Follow with Primary care provider  [facility]  and other consultants as instructed by your Hospitalist Physician  SEEK MEDICAL CARE OR RETURN TO EMERGENCY ROOM IF SYMPTOMS COME BACK, WORSEN OR NEW PROBLEM DEVELOPS   Please note: You were cared for by a hospitalist during your hospital stay. Every effort will be made to forward records to your primary care provider.  You can request that your primary  care provider send for your hospital records if they have not received them.  Once you are discharged, your primary care physician will handle any further medical issues. Please note that NO REFILLS for any discharge medications will be authorized once you are discharged, as it is imperative that you return to your primary care physician (or establish a relationship with a primary care physician if you do not have one) for your post hospital discharge needs so that they can reassess your need for medications and monitor your lab values.  Please get a complete blood count and chemistry panel checked by your Primary MD at your next visit, and again as instructed by your Primary MD.  Get Medicines reviewed and adjusted: Please take all your medications with you for your next visit with your Primary MD  Laboratory/radiological data: Please request your Primary MD to go over all hospital tests and procedure/radiological results at the follow up, please ask your primary care provider to get all Hospital records sent to his/her office.  In some cases, they will be blood work, cultures and biopsy results pending at the time of your discharge. Please request that your primary care provider follow up on  these results.  If you are diabetic, please bring your blood sugar readings with you to your follow up appointment with primary care.    Please call and make your follow up appointments as soon as possible.    Also Note the following: If you experience worsening of your admission symptoms, develop shortness of breath, life threatening emergency, suicidal or homicidal thoughts you must seek medical attention immediately by calling 911 or calling your MD immediately  if symptoms less severe.  You must read complete instructions/literature along with all the possible adverse reactions/side effects for all the Medicines you take and that have been prescribed to you. Take any new Medicines after you have  completely understood and accpet all the possible adverse reactions/side effects.   Do not drive when taking Pain medications or sleeping medications (Benzodiazepines)  Do not take more than prescribed Pain, Sleep and Anxiety Medications. It is not advisable to combine anxiety,sleep and pain medications without talking with your primary care practitioner  Special Instructions: If you have smoked or chewed Tobacco  in the last 2 yrs please stop smoking, stop any regular Alcohol  and or any Recreational drug use.  Wear Seat belts while driving.  Do not drive if taking any narcotic, mind altering or controlled substances or recreational drugs or alcohol.

## 2019-08-12 NOTE — Progress Notes (Signed)
Nsg Discharge Note  Admit Date:  08/10/2019 Discharge date: 08/12/2019   Logan Dawson to be D/C'd Home per MD order.  AVS completed.  Copy for chart, and copy for patient signed, and dated. Patient/caregiver able to verbalize understanding.  Discharge Medication: Allergies as of 08/12/2019      Reactions   Lisinopril    Sulfa Antibiotics Hives      Medication List    STOP taking these medications   amoxicillin-clavulanate 875-125 MG tablet Commonly known as: AUGMENTIN   famotidine 20 MG tablet Commonly known as: PEPCID   levofloxacin 750 MG tablet Commonly known as: Levaquin   ondansetron 4 MG disintegrating tablet Commonly known as: Zofran ODT   ondansetron 4 MG tablet Commonly known as: ZOFRAN   potassium chloride SA 20 MEQ tablet Commonly known as: KLOR-CON     TAKE these medications   albuterol 108 (90 Base) MCG/ACT inhaler Commonly known as: VENTOLIN HFA Inhale 1 puff into the lungs every 4 (four) hours as needed for wheezing or shortness of breath (cough). What changed: reasons to take this   dextromethorphan 30 MG/5ML liquid Commonly known as: Delsym Take 10 mLs (60 mg total) by mouth 2 (two) times daily as needed for cough.   doxycycline 100 MG capsule Commonly known as: VIBRAMYCIN Take 1 capsule (100 mg total) by mouth 2 (two) times daily for 3 days.   FLUoxetine 20 MG capsule Commonly known as: PROZAC Take 20 mg by mouth daily.   metoprolol succinate 25 MG 24 hr tablet Commonly known as: TOPROL-XL Take 1 tablet (25 mg total) by mouth daily. What changed: how much to take   pantoprazole 40 MG tablet Commonly known as: PROTONIX Take 1 tablet (40 mg total) by mouth daily. What changed: when to take this   predniSONE 20 MG tablet Commonly known as: DELTASONE Take 2 PO QAM x3days, 1 PO QAM x4days Start taking on: August 13, 2019 What changed:   medication strength  additional instructions   Spacer/Aero Chamber Mouthpiece Misc 1 each by Does  not apply route every 6 (six) hours as needed.       Discharge Assessment: Vitals:   08/12/19 0427 08/12/19 0809  BP: 100/63   Pulse: 70   Resp: 20   Temp: 98.1 F (36.7 C)   SpO2: 95% 95%   Skin clean, dry and intact without evidence of skin break down, no evidence of skin tears noted. IV catheter discontinued intact. Site without signs and symptoms of complications - no redness or edema noted at insertion site, patient denies c/o pain - only slight tenderness at site.  Dressing with slight pressure applied.  D/c Instructions-Education: Discharge instructions given to patient/family with verbalized understanding. D/c education completed with patient/family including follow up instructions, medication list, d/c activities limitations if indicated, with other d/c instructions as indicated by MD - patient able to verbalize understanding, all questions fully answered. Patient instructed to return to ED, call 911, or call MD for any changes in condition.  Patient escorted via WC, and D/C home via private auto.  Brandy Hale, LPN 04/13/1827 93:71 PM

## 2019-08-12 NOTE — Discharge Summary (Addendum)
Physician Discharge Summary  Logan MowJacob C Belote ZOX:[id number] DOB: 1995/03/26 DOA: 08/10/2019  [facility]  Admit date: 08/10/2019 Discharge date: 08/12/2019  Admitted From: Disposition:   Recommendations for Outpatient Follow-up:  Follow up with PCP in 1-2  weeks  Discharge Condition: STABLE   CODE STATUS: FULL    Brief Hospitalization Summary: Please see all hospital notes, images, labs for full details of the hospitalization. ADMISSION HPI: Logan Dawson is a 24 y.o. male with medical history significant for hypertension, asthma, depression, GERD, history of abdominal surgeries, skull fracture with subdural hematoma 2015 who presented to the ER with persistent cough, shortness of breath.  Patient reports he has been having a cough for about a month and a half.  He was treated with oral azithromycin for 5 days but continued to have a productive cough.  He was then seen in the ER on 07/29/2019 as per prior records and had a work-up including a CT PE study that was negative for PE but noted likely bronchopneumonia.  Patient was discharged home on Augmentin and doxycycline but never began the treatment.  Patient returned back to the ER on 08/08/2019 after having a mechanical fall.  Work-up was done including a CT of the head which was negative.  He was then prescribed Levaquin and discharged home.  He returned back to the ER again for shortness of breath. He reports productive cough, chest tightness but no wheezing. He had mild fever today with chills.  No nausea, vomiting, abdominal pain, diarrhea, dizziness, lightheadedness, seizure, syncope.  ED Course: Vital Signs reviewed on presentation, significant for temperature 100, heart rate 121, blood pressure 123/76, saturation 97% on room air. Labs reviewed, significant for sodium 139, potassium 3.6, BUN 14, creatinine 0.9, lactic acid 1.5, WBC count 16.0, hemoglobin 15.1, hematocrit 43, platelets 239, SARS Covid RT-PCR is  negative.  Imaging personally Reviewed, chest x-ray shows increasing reticulonodular opacities in the right mid to upper lung which could represent worsening multifocal infection. EKG personally reviewed, shows sinus tachycardia, no acute ST-T changes.  RSV Pneumonia, Sepsis secondary to community-acquired pneumonia superimposed by viral infection RSV.  Sepsis ruled in Patient has been having cough for about a month and a half.  [facility] and treated with 5 days of oral azithromycin.  [facility] with persistent symptoms and was prescribed Augmentin and doxycycline however never actually took the medication.  Return back to ER on 08/08/2019 and was prescribed Levaquin orally but has only taken 2 doses so far.  Patient was referred for admission to hospital service for presumed failure of oral antibiotics however it seems he had some issues with compliance.  Nonetheless patient presented with a temperature of 100, mild tachycardia, leukocytosis to 16K.  -Treated empiric antibiotics Rocephin and azithromycin, DC on doxycycline x 3 more days.  -Sputum culture still pending -Blood cultures negative to date -Started IV Solu-Medrol 60 mg twice daily and now on oral prednisone -Resume prednisone at discharge and taper off   Other Active Problems: Mild intermittent asthma with acute exacerbation -Continue bronchodilators -Continue steroids   Hypokalemia RESOLVED  Serum potassium 3.3 Repleted orally Goal serum potassium 4.0   Hypomagnesemia RESOLVED  Serum magnesium 1.7 Repleted intravenously Goal serum magnesium 2.0   Hypertension Blood pressure is at goal -Continue metoprolol ER 25 mg daily    Chronic anxiety/depression Stable Continue Prozac   GERD Stable Continue PPI     DVT prophylaxis:  Subcu Lovenox daily  Code Status:  Full code Family communication: updated by phone   Consults called: N/A    Status is: Inpatient     Dispo:              Patient From: Home             Planned Disposition: Home             Expected discharge date: 08/12/19             Medically stable for discharge: Yes      Discharge Diagnoses:  Principal Problem:   Community acquired pneumonia Active Problems:   Sepsis due to pneumonia (HCC)   CAP (community acquired pneumonia)  Discharge Instructions:   Allergies as of 08/12/2019       Reactions   Lisinopril    Sulfa Antibiotics Hives        Medication List     STOP taking these medications    amoxicillin-clavulanate 875-125 MG tablet Commonly known as: AUGMENTIN   famotidine 20 MG tablet Commonly known as: PEPCID   levofloxacin 750 MG tablet Commonly known as: Levaquin   ondansetron 4 MG disintegrating tablet Commonly known as: Zofran ODT   ondansetron 4 MG tablet Commonly known as: ZOFRAN   potassium chloride SA 20 MEQ tablet Commonly known as: KLOR-CON       TAKE these medications    albuterol 108 (90 Base) MCG/ACT inhaler Commonly known as: VENTOLIN HFA Inhale 1 puff into the lungs every 4 (four) hours as needed for wheezing or shortness of breath (cough). What changed: reasons to take this   dextromethorphan 30 MG/5ML liquid Commonly known as: Delsym Take 10 mLs (60 mg total) by mouth 2 (two) times daily as needed for cough.   doxycycline 100 MG capsule Commonly known as: VIBRAMYCIN Take 1 capsule (100 mg total) by mouth 2 (two) times daily for 3 days.   FLUoxetine 20 MG capsule Commonly known as: PROZAC Take 20 mg by mouth daily.   metoprolol succinate 25 MG 24 hr tablet Commonly known as: TOPROL-XL Take 1 tablet (25 mg total) by mouth daily. What changed: how much to take   pantoprazole 40 MG tablet Commonly known as: PROTONIX Take 1 tablet (40 mg total) by mouth daily. What changed: when to take this   predniSONE 20 MG tablet Commonly known as: DELTASONE Take 2 PO QAM x3days, 1 PO QAM x4days Start taking on: August 13, 2019 What changed:   medication strength additional instructions   Spacer/Aero Chamber Mouthpiece Misc 1 each by Does not apply route every 6 (six) hours as needed.        Follow-up Information     [facility]. Schedule an appointment as soon as possible for a visit in 2 week(s).   Why: Hospital Follow Up  Contact information: [address] [place] [address] [phone]                Allergies  Allergen Reactions   Lisinopril    Sulfa Antibiotics Hives   Allergies as of 08/12/2019       Reactions   Lisinopril    Sulfa Antibiotics Hives        Medication List     STOP taking these medications    amoxicillin-clavulanate 875-125 MG tablet Commonly known as: AUGMENTIN   famotidine 20 MG tablet Commonly known as: PEPCID   levofloxacin 750 MG tablet Commonly known as: Levaquin   ondansetron 4 MG disintegrating tablet Commonly  known as: Zofran ODT   ondansetron 4 MG tablet Commonly known as: ZOFRAN   potassium chloride SA 20 MEQ tablet Commonly known as: KLOR-CON       TAKE these medications    albuterol 108 (90 Base) MCG/ACT inhaler Commonly known as: VENTOLIN HFA Inhale 1 puff into the lungs every 4 (four) hours as needed for wheezing or shortness of breath (cough). What changed: reasons to take this   dextromethorphan 30 MG/5ML liquid Commonly known as: Delsym Take 10 mLs (60 mg total) by mouth 2 (two) times daily as needed for cough.   doxycycline 100 MG capsule Commonly known as: VIBRAMYCIN Take 1 capsule (100 mg total) by mouth 2 (two) times daily for 3 days.   FLUoxetine 20 MG capsule Commonly known as: PROZAC Take 20 mg by mouth daily.   metoprolol succinate 25 MG 24 hr tablet Commonly known as: TOPROL-XL Take 1 tablet (25 mg total) by mouth daily. What changed: how much to take   pantoprazole 40 MG tablet Commonly known as: PROTONIX Take 1 tablet (40 mg total) by mouth daily. What changed: when to take this    predniSONE 20 MG tablet Commonly known as: DELTASONE Take 2 PO QAM x3days, 1 PO QAM x4days Start taking on: August 13, 2019 What changed:  medication strength additional instructions   Spacer/Aero Chamber Mouthpiece Misc 1 each by Does not apply route every 6 (six) hours as needed.        Procedures/Studies: DG Chest 2 View  Result Date: 08/08/2019 CLINICAL DATA:  Cough, syncope EXAM: CHEST - 2 VIEW COMPARISON:  Chest radiograph dated 07/28/2019 FINDINGS: The heart size and mediastinal contours are within normal limits. Both lungs are clear. The visualized skeletal structures are unremarkable. IMPRESSION: No active cardiopulmonary disease. Electronically Signed   By: Romona Curls M.D.   On: 08/08/2019 15:06   DG Chest 2 View  Result Date: 07/28/2019 CLINICAL DATA:  Shortness of breath, recently diagnosed pneumonia EXAM: CHEST - 2 VIEW COMPARISON:  Radiograph 07/27/2019, 07/21/2019 FINDINGS: Slightly low lung volumes with some central vascular crowding. Mild residual airways thickening. No consolidation, features of edema, pneumothorax, or effusion. The cardiomediastinal contours are unremarkable. No acute osseous or soft tissue abnormality. IMPRESSION: Low volumes with atelectatic change. Mild persistent airways thickening could reflect some residual bronchitic change. Electronically Signed   By: Kreg Shropshire M.D.   On: 07/28/2019 23:46   CT Head Wo Contrast  Result Date: 08/08/2019 CLINICAL DATA:  Status post fall. EXAM: CT HEAD WITHOUT CONTRAST TECHNIQUE: Contiguous axial images were obtained from the base of the skull through the vertex without intravenous contrast. COMPARISON:  March 19, 2013 FINDINGS: Brain: No evidence of acute infarction, hemorrhage, hydrocephalus, extra-axial collection or mass lesion/mass effect. Vascular: No hyperdense vessel or unexpected calcification. Skull: Normal. Negative for fracture or focal lesion. Sinuses/Orbits: There is marked severity right maxillary  sinus and right ethmoid sinus mucosal thickening. Mild right-sided frontal sinus mucosal thickening is also noted. Other: None. IMPRESSION: 1. No acute intracranial abnormality. 2. Marked severity right maxillary sinus and right ethmoid sinus mucosal thickening. Mild right-sided frontal sinus mucosal thickening is also noted. Electronically Signed   By: Aram Candela M.D.   On: 08/08/2019 15:43   CT Angio Chest PE W and/or Wo Contrast  Result Date: 07/29/2019 CLINICAL DATA:  Shortness of breath, recent diagnosis of pneumonia. EXAM: CT ANGIOGRAPHY CHEST WITH CONTRAST TECHNIQUE: Multidetector CT imaging of the chest was performed using the standard protocol during bolus administration of intravenous  contrast. Multiplanar CT image reconstructions and MIPs were obtained to evaluate the vascular anatomy. CONTRAST:  OMNIPAQUE IOHEXOL 350 MG/ML SOLN COMPARISON:  Same-day radiograph, CT abdomen and pelvis 10/21/2018 FINDINGS: Cardiovascular: Satisfactory opacification the pulmonary arteries to the segmental level. No pulmonary artery filling defects are identified. Central pulmonary arteries are normal caliber. Mediastinum/Nodes: No mediastinal fluid or gas. Normal thyroid gland and thoracic inlet. No acute abnormality of the trachea or esophagus. Few subcentimeter low-attenuation mediastinal and left hilar nodes favored to be reactive. No worrisome mediastinal, hilar or axillary adenopathy. Lungs/Pleura: There is focal centrilobular nodularity in the left lung base with some associated mild airways thickening, likely reflecting a bronchiolitis/developing bronchopneumonia. No other areas of consolidation. Mild basilar atelectatic changes are present as well. No effusion or pneumothorax. Small amount of subpleural fat in the lung bases. A 3 mm subpleural nodule seen along the right major fissure (6/60) is most likely a subpleural lymph node in a patient of this age. Upper Abdomen: No acute abnormalities present  in the visualized portions of the upper abdomen. Prior cholecystectomy. Musculoskeletal: Mild straightening of normal thoracic kyphosis. No acute osseous abnormality. No worrisome chest wall abnormalities. Review of the MIP images confirms the above findings. IMPRESSION: 1. No evidence of pulmonary embolism. 2. Focal centrilobular nodularity in the left lung base with associated mild airways thickening, likely reflecting a bronchiolitis/bronchopneumonia. Electronically Signed   By: Kreg Shropshire M.D.   On: 07/29/2019 05:19   DG Chest Port 1 View  Result Date: 08/10/2019 CLINICAL DATA:  Shortness of breath, recently diagnosed with pneumonia EXAM: PORTABLE CHEST 1 VIEW COMPARISON:  Radiograph 08/08/2019, CT 07/29/2019 FINDINGS: Opacity predominantly seen in the superior segment left lower lobe on prior chest CT is less well visualized on this examination. However there is increasing reticulonodular opacities elsewhere in the right mid to upper lung which could reflect worsening infection elsewhere. No pneumothorax or effusion. Stable cardiomediastinal contours. No acute osseous or soft tissue abnormality. IMPRESSION: 1. Increasing reticulonodular opacities in the right mid to upper lung which could reflect worsening multifocal infection. Electronically Signed   By: Kreg Shropshire M.D.   On: 08/10/2019 01:27     Subjective: Pt c/o of cough only otherwise doing well, no SOB and no CP.   Discharge Exam: Vitals:   08/12/19 0427 08/12/19 0809  BP: 100/63   Pulse: 70   Resp: 20   Temp: 98.1 F (36.7 C)   SpO2: 95% 95%   Vitals:   08/11/19 2114 08/12/19 0427 08/12/19 0600 08/12/19 0809  BP: 120/73 100/63    Pulse: 92 70    Resp: 18 20    Temp: 97.9 F (36.6 C) 98.1 F (36.7 C)    TempSrc: Oral Oral    SpO2: 92% 95%  95%  Weight:   124.5 kg   Height:       General: Pt is alert, awake, not in acute distress Cardiovascular: RRR, S1/S2 +, no rubs, no gallops Respiratory: CTA bilaterally, no  wheezing, no rhonchi Abdominal: Soft, NT, ND, bowel sounds + Extremities: no edema, no cyanosis   The results of significant diagnostics from this hospitalization (including imaging, microbiology, ancillary and laboratory) are listed below for reference.     Microbiology: Recent Results (from the past 240 hour(s))  SARS Coronavirus 2 by RT PCR (hospital order, performed in [facility] hospital lab) Nasopharyngeal Nasopharyngeal Swab     Status: None   Collection Time: 08/10/19 12:50 AM   Specimen: Nasopharyngeal Swab  Result Value Ref  Range Status   SARS Coronavirus 2 NEGATIVE NEGATIVE Final    Comment: (NOTE) SARS-CoV-2 target nucleic acids are NOT DETECTED.  The SARS-CoV-2 RNA is generally detectable in upper and lower respiratory specimens during the acute phase of infection. The lowest concentration of SARS-CoV-2 viral copies this assay can detect is 250 copies / mL. A negative result does not preclude SARS-CoV-2 infection and should not be used as the sole basis for treatment or other patient management decisions.  A negative result may occur with improper specimen collection / handling, submission of specimen other than nasopharyngeal swab, presence of viral mutation(s) within the areas targeted by this assay, and inadequate number of viral copies (<250 copies / mL). A negative result must be combined with clinical observations, patient history, and epidemiological information.  Fact Sheet for Patients:   BoilerBrush.com.cy  Fact Sheet for Healthcare Providers: https://pope.com/  This test is not yet approved or  cleared by the [place] FDA and has been authorized for detection and/or diagnosis of SARS-CoV-2 by FDA under an Emergency Use Authorization (EUA).  This EUA will remain in effect (meaning this test can be used) for the duration of the COVID-19 declaration under Section 564(b)(1) of the Act, 21 U.S.C. section  360bbb-3(b)(1), unless the authorization is terminated or revoked sooner.  Performed at [facility], [address] [place], [place] [address]   Blood culture (routine x 2)     Status: None (Preliminary result)   Collection Time: 08/10/19  2:05 AM   Specimen: Right Antecubital; Blood  Result Value Ref Range Status   Specimen Description RIGHT ANTECUBITAL  Final   Special Requests   Final    BOTTLES DRAWN AEROBIC AND ANAEROBIC Blood Culture adequate volume   Culture   Final    NO GROWTH 2 DAYS Performed at [facility], [address], [place], [place] [address]    Report Status PENDING  Incomplete  Blood culture (routine x 2)     Status: None (Preliminary result)   Collection Time: 08/10/19  2:10 AM   Specimen: BLOOD LEFT FOREARM  Result Value Ref Range Status   Specimen Description BLOOD LEFT FOREARM  Final   Special Requests   Final    BOTTLES DRAWN AEROBIC AND ANAEROBIC Blood Culture adequate volume   Culture   Final    NO GROWTH 2 DAYS Performed at [facility], [address], [place], [place] [address]    Report Status PENDING  Incomplete  Resp Panel by RT PCR (RSV, Flu A&B, Covid) - Nasopharyngeal Swab     Status: Abnormal   Collection Time: 08/10/19  2:21 AM   Specimen: Nasopharyngeal Swab  Result Value Ref Range Status   SARS Coronavirus 2 by RT PCR NEGATIVE NEGATIVE Final    Comment: (NOTE) SARS-CoV-2 target nucleic acids are NOT DETECTED.  The SARS-CoV-2 RNA is generally detectable in upper respiratoy specimens during the acute phase of infection. The lowest concentration of SARS-CoV-2 viral copies this assay can detect is 131 copies/mL. A negative result does not preclude SARS-Cov-2 infection and should not be used as the sole basis for treatment or other patient management decisions. A negative result may occur with  improper specimen collection/handling, submission of specimen other than nasopharyngeal swab, presence of viral mutation(s) within the areas  targeted by this assay, and inadequate number of viral copies (<131 copies/mL). A negative result must be combined with clinical observations, patient history, and epidemiological information. The expected result is Negative.  Fact Sheet for Patients:  https://www.moore.com/  Fact Sheet for Healthcare Providers:  https://www.young.biz/  This test is no t yet approved or cleared by the [place] FDA and  has been authorized for detection and/or diagnosis of SARS-CoV-2 by FDA under an Emergency Use Authorization (EUA). This EUA will remain  in effect (meaning this test can be used) for the duration of the COVID-19 declaration under Section 564(b)(1) of the Act, 21 U.S.C. section 360bbb-3(b)(1), unless the authorization is terminated or revoked sooner.     Influenza A by PCR NEGATIVE NEGATIVE Final   Influenza B by PCR NEGATIVE NEGATIVE Final    Comment: (NOTE) The Xpert Xpress SARS-CoV-2/FLU/RSV assay is intended as an aid in  the diagnosis of influenza from Nasopharyngeal swab specimens and  should not be used as a sole basis for treatment. Nasal washings and  aspirates are unacceptable for Xpert Xpress SARS-CoV-2/FLU/RSV  testing.  Fact Sheet for Patients: https://www.moore.com/  Fact Sheet for Healthcare Providers: https://www.young.biz/  This test is not yet approved or cleared by the [place] FDA and  has been authorized for detection and/or diagnosis of SARS-CoV-2 by  FDA under an Emergency Use Authorization (EUA). This EUA will remain  in effect (meaning this test can be used) for the duration of the  Covid-19 declaration under Section 564(b)(1) of the Act, 21  U.S.C. section 360bbb-3(b)(1), unless the authorization is  terminated or revoked.    Respiratory Syncytial Virus by PCR POSITIVE (A) NEGATIVE Final    Comment: (NOTE) Fact Sheet for  Patients: https://www.moore.com/  Fact Sheet for Healthcare Providers: https://www.young.biz/  This test is not yet approved or cleared by the [place] FDA and  has been authorized for detection and/or diagnosis of SARS-CoV-2 by  FDA under an Emergency Use Authorization (EUA). This EUA will remain  in effect (meaning this test can be used) for the duration of the  COVID-19 declaration under Section 564(b)(1) of the Act, 21 U.S.C.  section 360bbb-3(b)(1), unless the authorization is terminated or  revoked. Performed at [facility], [address], [place], [place] [address]      Labs: BNP (last 3 results) No results for input(s): BNP in the last 8760 hours. Basic Metabolic Panel: Recent Labs  Lab 08/10/19 0050 08/10/19 0455 08/11/19 0523 08/12/19 0451  NA 139 136 137 136  K 3.6 3.3* 3.3* 4.4  CL 103 103 100 102  CO2 GLUCOSE 117* 115* 122* 130*  BUN CREATININE 0.97 0.94 0.85 0.73  CALCIUM 9.0 8.2* 8.4* 9.1  MG  --   --  1.7 2.3   Liver Function Tests: Recent Labs  Lab 08/11/19 0523 08/12/19 0451  AST 70* 63*  ALT 39 43  ALKPHOS 59 67  BILITOT 0.6 0.5  PROT 6.6 7.4  ALBUMIN 3.5 3.7   No results for input(s): LIPASE, AMYLASE in the last 168 hours. No results for input(s): AMMONIA in the last 168 hours. CBC: Recent Labs  Lab 08/10/19 0050 08/10/19 0455 08/11/19 0523 08/12/19 0451  WBC 16.0* 14.2* 13.4* 15.8*  NEUTROABS 11.6*  --  9.3* 13.3*  HGB 15.1 13.3 13.7 15.1  HCT 43.3 39.3 40.6 44.8  MCV 86.9 88.9 88.3 88.4  PLT 239 214 208 280   Cardiac Enzymes: No results for input(s): CKTOTAL, CKMB, CKMBINDEX, TROPONINI in the last 168 hours. BNP: Invalid input(s): POCBNP CBG: No results for input(s): GLUCAP in the last 168 hours. D-Dimer No results for input(s): DDIMER in the last 72 hours.  Hgb A1c No results for input(s): HGBA1C in the last 72 hours. Lipid Profile No results for  input(s): CHOL, HDL, LDLCALC, TRIG, CHOLHDL, LDLDIRECT in the last 72 hours. Thyroid function studies No results for input(s): TSH, T4TOTAL, T3FREE, THYROIDAB in the last 72 hours.  Invalid input(s): FREET3 Anemia work up No results for input(s): VITAMINB12, FOLATE, FERRITIN, TIBC, IRON, RETICCTPCT in the last 72 hours. Urinalysis    Component Value Date/Time   COLORURINE YELLOW 10/21/2018 2105   APPEARANCEUR CLEAR 10/21/2018 2105   LABSPEC 1.020 10/21/2018 2105   PHURINE 6.0 10/21/2018 2105   GLUCOSEU NEGATIVE 10/21/2018 2105   HGBUR NEGATIVE 10/21/2018 2105   BILIRUBINUR NEGATIVE 10/21/2018 2105   KETONESUR 5 (A) 10/21/2018 2105   PROTEINUR NEGATIVE 10/21/2018 2105   UROBILINOGEN 0.2 04/05/2010 2024   NITRITE NEGATIVE 10/21/2018 2105   LEUKOCYTESUR TRACE (A) 10/21/2018 2105   Sepsis Labs Invalid input(s): PROCALCITONIN,  WBC,  LACTICIDVEN Microbiology Recent Results (from the past 240 hour(s))  SARS Coronavirus 2 by RT PCR (hospital order, performed in [facility] hospital lab) Nasopharyngeal Nasopharyngeal Swab     Status: None   Collection Time: 08/10/19 12:50 AM   Specimen: Nasopharyngeal Swab  Result Value Ref Range Status   SARS Coronavirus 2 NEGATIVE NEGATIVE Final    Comment: (NOTE) SARS-CoV-2 target nucleic acids are NOT DETECTED.  The SARS-CoV-2 RNA is generally detectable in upper and lower respiratory specimens during the acute phase of infection. The lowest concentration of SARS-CoV-2 viral copies this assay can detect is 250 copies / mL. A negative result does not preclude SARS-CoV-2 infection and should not be used as the sole basis for treatment or other patient management decisions.  A negative result may occur with improper specimen collection / handling, submission of specimen other than nasopharyngeal swab, presence of viral mutation(s) within the areas targeted by this assay, and inadequate number of viral copies (<250 copies / mL). A negative result  must be combined with clinical observations, patient history, and epidemiological information.  Fact Sheet for Patients:   BoilerBrush.com.cy  Fact Sheet for Healthcare Providers: https://pope.com/  This test is not yet approved or  cleared by the [place] FDA and has been authorized for detection and/or diagnosis of SARS-CoV-2 by FDA under an Emergency Use Authorization (EUA).  This EUA will remain in effect (meaning this test can be used) for the duration of the COVID-19 declaration under Section 564(b)(1) of the Act, 21 U.S.C. section 360bbb-3(b)(1), unless the authorization is terminated or revoked sooner.  Performed at [facility], [address], [place], [place] [address]   Blood culture (routine x 2)     Status: None (Preliminary result)   Collection Time: 08/10/19  2:05 AM   Specimen: Right Antecubital; Blood  Result Value Ref Range Status   Specimen Description RIGHT ANTECUBITAL  Final   Special Requests   Final    BOTTLES DRAWN AEROBIC AND ANAEROBIC Blood Culture adequate volume   Culture   Final    NO GROWTH 2 DAYS Performed at [facility], [address] [address], [place], [place] [address]    Report Status PENDING  Incomplete  Blood culture (routine x 2)     Status: None (Preliminary result)   Collection Time: 08/10/19  2:10 AM   Specimen: BLOOD LEFT FOREARM  Result Value Ref Range Status   Specimen Description BLOOD LEFT FOREARM  Final   Special Requests   Final    BOTTLES DRAWN AEROBIC AND ANAEROBIC Blood Culture adequate volume  Culture   Final    NO GROWTH 2 DAYS Performed at [facility], [address] [place], [place] [address]    Report Status PENDING  Incomplete  Resp Panel by RT PCR (RSV, Flu A&B, Covid) - Nasopharyngeal Swab     Status: Abnormal   Collection Time: 08/10/19  2:21 AM   Specimen: Nasopharyngeal Swab  Result Value Ref Range Status   SARS Coronavirus 2 by RT PCR NEGATIVE NEGATIVE Final     Comment: (NOTE) SARS-CoV-2 target nucleic acids are NOT DETECTED.  The SARS-CoV-2 RNA is generally detectable in upper respiratoy specimens during the acute phase of infection. The lowest concentration of SARS-CoV-2 viral copies this assay can detect is 131 copies/mL. A negative result does not preclude SARS-Cov-2 infection and should not be used as the sole basis for treatment or other patient management decisions. A negative result may occur with  improper specimen collection/handling, submission of specimen other than nasopharyngeal swab, presence of viral mutation(s) within the areas targeted by this assay, and inadequate number of viral copies (<131 copies/mL). A negative result must be combined with clinical observations, patient history, and epidemiological information. The expected result is Negative.  Fact Sheet for Patients:  https://www.moore.com/  Fact Sheet for Healthcare Providers:  https://www.young.biz/  This test is no t yet approved or cleared by the [place] FDA and  has been authorized for detection and/or diagnosis of SARS-CoV-2 by FDA under an Emergency Use Authorization (EUA). This EUA will remain  in effect (meaning this test can be used) for the duration of the COVID-19 declaration under Section 564(b)(1) of the Act, 21 U.S.C. section 360bbb-3(b)(1), unless the authorization is terminated or revoked sooner.     Influenza A by PCR NEGATIVE NEGATIVE Final   Influenza B by PCR NEGATIVE NEGATIVE Final    Comment: (NOTE) The Xpert Xpress SARS-CoV-2/FLU/RSV assay is intended as an aid in  the diagnosis of influenza from Nasopharyngeal swab specimens and  should not be used as a sole basis for treatment. Nasal washings and  aspirates are unacceptable for Xpert Xpress SARS-CoV-2/FLU/RSV  testing.  Fact Sheet for Patients: https://www.moore.com/  Fact Sheet for Healthcare  Providers: https://www.young.biz/  This test is not yet approved or cleared by the [place] FDA and  has been authorized for detection and/or diagnosis of SARS-CoV-2 by  FDA under an Emergency Use Authorization (EUA). This EUA will remain  in effect (meaning this test can be used) for the duration of the  Covid-19 declaration under Section 564(b)(1) of the Act, 21  U.S.C. section 360bbb-3(b)(1), unless the authorization is  terminated or revoked.    Respiratory Syncytial Virus by PCR POSITIVE (A) NEGATIVE Final    Comment: (NOTE) Fact Sheet for Patients: https://www.moore.com/  Fact Sheet for Healthcare Providers: https://www.young.biz/  This test is not yet approved or cleared by the [place] FDA and  has been authorized for detection and/or diagnosis of SARS-CoV-2 by  FDA under an Emergency Use Authorization (EUA). This EUA will remain  in effect (meaning this test can be used) for the duration of the  COVID-19 declaration under Section 564(b)(1) of the Act, 21 U.S.C.  section 360bbb-3(b)(1), unless the authorization is terminated or  revoked. Performed at [facility], [address] [place], [place] [address]    Time coordinating discharge: 35 minutes   SIGNED:  Standley Dakins, MD  [facility] 08/12/2019, 9:38 AM How to contact the [facility] Attending or Consulting provider 7A - 7P or covering provider during after hours  7P -7A, for this patient?  Check the care team in [facility] and look for a) attending/consulting [facility] provider listed and b) the [facility] team listed Log into [email or web address] and use [place]'s universal password to access. If you do not have the password, please contact the hospital operator. Locate the [facility] provider you are looking for under [facility] and page to a number that you can be directly reached. If you still have difficulty reaching the provider, please page the [facility] (Director on  Call) for the Hospitalists listed on amion for assistance.

## 2019-08-15 LAB — CULTURE, BLOOD (ROUTINE X 2)
Culture: NO GROWTH
Culture: NO GROWTH
Special Requests: ADEQUATE
Special Requests: ADEQUATE

## 2020-01-26 ENCOUNTER — Encounter (HOSPITAL_COMMUNITY): Payer: Self-pay | Admitting: *Deleted

## 2020-01-26 ENCOUNTER — Emergency Department (HOSPITAL_COMMUNITY): Payer: Self-pay

## 2020-01-26 ENCOUNTER — Other Ambulatory Visit: Payer: Self-pay

## 2020-01-26 ENCOUNTER — Emergency Department (HOSPITAL_COMMUNITY)
Admission: EM | Admit: 2020-01-26 | Discharge: 2020-01-26 | Disposition: A | Discharge status: Home / Self Care | Payer: Self-pay | Attending: Emergency Medicine | Admitting: Emergency Medicine

## 2020-01-26 DIAGNOSIS — Z7951 Long term (current) use of inhaled steroids: Secondary | ICD-10-CM | POA: Insufficient documentation

## 2020-01-26 DIAGNOSIS — J45909 Unspecified asthma, uncomplicated: Secondary | ICD-10-CM | POA: Insufficient documentation

## 2020-01-26 DIAGNOSIS — K529 Noninfective gastroenteritis and colitis, unspecified: Secondary | ICD-10-CM | POA: Insufficient documentation

## 2020-01-26 DIAGNOSIS — I10 Essential (primary) hypertension: Secondary | ICD-10-CM | POA: Insufficient documentation

## 2020-01-26 DIAGNOSIS — R109 Unspecified abdominal pain: Secondary | ICD-10-CM | POA: Insufficient documentation

## 2020-01-26 DIAGNOSIS — Z79899 Other long term (current) drug therapy: Secondary | ICD-10-CM | POA: Insufficient documentation

## 2020-01-26 LAB — COMPREHENSIVE METABOLIC PANEL
ALT: 31 U/L (ref 0–44)
AST: 51 U/L — ABNORMAL HIGH (ref 15–41)
Albumin: 4 g/dL (ref 3.5–5.0)
Alkaline Phosphatase: 76 U/L (ref 38–126)
Anion gap: 7 (ref 5–15)
BUN: 10 mg/dL (ref 6–20)
CO2: 29 mmol/L (ref 22–32)
Calcium: 8.9 mg/dL (ref 8.9–10.3)
Chloride: 101 mmol/L (ref 98–111)
Creatinine, Ser: 0.8 mg/dL (ref 0.61–1.24)
GFR, Estimated: >60 mL/min (ref >60)
Glucose, Bld: 98 mg/dL (ref 70–99)
Potassium: 4.1 mmol/L (ref 3.5–5.1)
Sodium: 137 mmol/L (ref 135–145)
Total Bilirubin: 0.5 mg/dL (ref 0.3–1.2)
Total Protein: 7.3 g/dL (ref 6.5–8.1)

## 2020-01-26 LAB — CBC WITH DIFFERENTIAL/PLATELET
Abs Immature Granulocytes: 0.06 10*3/uL (ref 0.00–0.07)
Basophils Absolute: 0.1 10*3/uL (ref 0.0–0.1)
Basophils Relative: 0 %
Eosinophils Absolute: 0.1 10*3/uL (ref 0.0–0.5)
Eosinophils Relative: 1 %
HCT: 45.3 % (ref 39.0–52.0)
Hemoglobin: 15.6 g/dL (ref 13.0–17.0)
Immature Granulocytes: 0 %
Lymphocytes Relative: 16 %
Lymphs Abs: 2.5 10*3/uL (ref 0.7–4.0)
MCH: 30.2 pg (ref 26.0–34.0)
MCHC: 34.4 g/dL (ref 30.0–36.0)
MCV: 87.8 fL (ref 80.0–100.0)
Monocytes Absolute: 1.1 10*3/uL — ABNORMAL HIGH (ref 0.1–1.0)
Monocytes Relative: 8 %
Neutro Abs: 11.2 10*3/uL — ABNORMAL HIGH (ref 1.7–7.7)
Neutrophils Relative %: 75 %
Platelets: 261 10*3/uL (ref 150–400)
RBC: 5.16 MIL/uL (ref 4.22–5.81)
RDW: 12.7 % (ref 11.5–15.5)
WBC: 14.9 10*3/uL — ABNORMAL HIGH (ref 4.0–10.5)
nRBC: 0 % (ref 0.0–0.2)

## 2020-01-26 LAB — URINALYSIS, ROUTINE W REFLEX MICROSCOPIC
Bilirubin Urine: NEGATIVE
Glucose, UA: NEGATIVE mg/dL
Hgb urine dipstick: NEGATIVE
Ketones, ur: NEGATIVE mg/dL
Leukocytes,Ua: NEGATIVE
Nitrite: NEGATIVE
Protein, ur: NEGATIVE mg/dL
Specific Gravity, Urine: 1.017 (ref 1.005–1.030)
pH: 7 (ref 5.0–8.0)

## 2020-01-26 LAB — LIPASE, BLOOD: Lipase: 34 U/L (ref 11–51)

## 2020-01-26 MED ORDER — ALUM & MAG HYDROXIDE-SIMETH 200-200-20 MG/5ML PO SUSP
30.0000 mL | Freq: Once | ORAL | Status: AC
  Administered 2020-01-26: 30 mL via ORAL
  Filled 2020-01-26: qty 30

## 2020-01-26 MED ORDER — ONDANSETRON 4 MG PO TBDP
4.0000 mg | ORAL_TABLET | Freq: Three times a day (TID) | ORAL | 0 refills | Status: DC | PRN
Start: 2020-01-26 — End: 2020-04-07

## 2020-01-26 MED ORDER — PANTOPRAZOLE SODIUM 40 MG PO TBEC
40.0000 mg | DELAYED_RELEASE_TABLET | Freq: Every day | ORAL | 0 refills | Status: DC
Start: 2020-01-26 — End: 2021-02-22

## 2020-01-26 MED ORDER — IOHEXOL 300 MG/ML  SOLN
100.0000 mL | Freq: Once | INTRAMUSCULAR | Status: AC | PRN
  Administered 2020-01-26: 100 mL via INTRAVENOUS

## 2020-01-26 MED ORDER — SUCRALFATE 1 GM/10ML PO SUSP
1.0000 g | Freq: Three times a day (TID) | ORAL | 0 refills | Status: DC
Start: 2020-01-26 — End: 2021-03-21

## 2020-01-26 MED ORDER — ONDANSETRON HCL 4 MG/2ML IJ SOLN
4.0000 mg | Freq: Once | INTRAMUSCULAR | Status: AC
  Administered 2020-01-26: 4 mg via INTRAVENOUS
  Filled 2020-01-26: qty 2

## 2020-01-26 MED ORDER — LIDOCAINE VISCOUS HCL 2 % MT SOLN
15.0000 mL | Freq: Once | OROMUCOSAL | Status: AC
  Administered 2020-01-26: 15 mL via ORAL
  Filled 2020-01-26: qty 15

## 2020-01-26 MED ORDER — SODIUM CHLORIDE 0.9 % IV BOLUS
1000.0000 mL | Freq: Once | INTRAVENOUS | Status: AC
  Administered 2020-01-26: 1000 mL via INTRAVENOUS

## 2020-01-26 MED ORDER — MORPHINE SULFATE (PF) 4 MG/ML IV SOLN
4.0000 mg | Freq: Once | INTRAVENOUS | Status: AC
  Administered 2020-01-26: 4 mg via INTRAVENOUS
  Filled 2020-01-26: qty 1

## 2020-01-26 NOTE — Discharge Instructions (Addendum)
You were see in the ED today for abdominal pain.  Your labs were overall reassuring- your white blood cell count was mildly elevated this can happen for a variety of reasons- have this rechecked by your primary care provider within 1 month. Your CT scan did not show any acute processes, there were fatty liver changes noted and you did have one stone within your kidney which would not currently be causing your symptoms.   Your symptoms may be due to stomach lining irritation or a virus.  .We are sending you home with the following medications to help with your symptoms:  - Protonix- please take 1 tablet in the morning prior to any meals to help with stomach acidity/pain.  - Carafate- please take prior to each meal and prior to bedtime to help with stomach acidity/pain.  - Zofran- please take every 8 hours as needed for nausea/vomiting.   We have prescribed you new medication(s) today. Discuss the medications prescribed today with your pharmacist as they can have adverse effects and interactions with your other medicines including over the counter and prescribed medications. Seek medical evaluation if you start to experience new or abnormal symptoms after taking one of these medicines, seek care immediately if you start to experience difficulty breathing, feeling of your throat closing, facial swelling, or rash as these could be indications of a more serious allergic reaction  Please follow attached diet guidelines.   Follow up with your primary care provider or with the GI doctor in your discharge instructions within 3 days for re-evaluation.  Return to the ER for new or worsening symptoms including but not limited to worsened pain, new pain, inability to keep fluids down, blood in vomit/stool, passing out, or any other concerns.

## 2020-01-26 NOTE — ED Notes (Signed)
Patient returned from CT at this time.

## 2020-01-26 NOTE — ED Notes (Signed)
Patient off floor to CT at this time.

## 2020-01-26 NOTE — ED Triage Notes (Signed)
Abdominal pain , states he has been hospitalized previously for same

## 2020-01-26 NOTE — ED Provider Notes (Signed)
[facility]   CSN: [id number] Arrival date & time: 01/26/20  1231     History Chief Complaint  Patient presents with  . Abdominal Pain    Logan Dawson is a 24 y.o. male with a hx of hypertension, appendectomy, cholecystectomy, SBO, SAH, & ADHD who presents to the ED with complaints of abdominal pain x 3 days. Patient states pain is mostly to the upper abdomen but can be in the lower abdomen as well. It feels tight, it is constant, worse when he tries to eat, alleviated some if in the fetal position on his side. Reports associated nausea. Last BM yesterday was loose, has passed gas since. States he has had similar pain in the past with variable diagnoses. Denies fever, chills, vomiting, melena, hematochezia, dysuria, or testicular pain/swelling.   HPI     Past Medical History:  Diagnosis Date  . ADHD (attention deficit hyperactivity disorder)   . Asthma   . Gastroenteritis   . Hypertension   . Left shoulder pain   . Meningitis   . Skull fracture (HCC)   . Subarachnoid hemorrhage [facility](HCC)     Patient Active Problem List   Diagnosis Date Noted  . CAP (community acquired pneumonia) 08/11/2019  . Community acquired pneumonia 08/10/2019  . Sepsis due to pneumonia (HCC) 08/10/2019  . Gastroenteritis 04/14/2018  . Black-out (not amnesia) 11/27/2012  . Concussion with < 1 hr loss of consciousness 11/27/2012  . Hearing voices 11/27/2012  . Skull fracture with concussion (HCC) 11/27/2012  . Subarachnoid hemorrhage (HCC) 11/27/2012  . Unable to control anger 11/27/2012  . Abrasions of multiple sites 11/14/2012  . Linear skull fracture (HCC) 11/14/2012  . SAH (subarachnoid hemorrhage) (HCC) 11/14/2012  . Trauma 11/14/2012    Past Surgical History:  Procedure Laterality Date  . APPENDECTOMY    . CHOLECYSTECTOMY    . HERNIA REPAIR         Family History  Problem Relation Age of Onset  . Diabetes Mother   . COPD Mother   . Diabetes  Father   . Hypertension Father   . Heart disease Father     Social History   Tobacco Use  . Smoking status: Never Smoker  . Smokeless tobacco: Never Used  Vaping Use  . Vaping Use: Never used  Substance Use Topics  . Alcohol use: Yes    Comment: occ.  . Drug use: No    Home Medications Prior to Admission medications   Medication Sig Start Date End Date Taking? Authorizing Provider  albuterol (VENTOLIN HFA) 108 (90 Base) MCG/ACT inhaler Inhale 1 puff into the lungs every 4 (four) hours as needed for wheezing or shortness of breath (cough). 08/12/19   Johnson, Clanford L, MD  dextromethorphan (DELSYM) 30 MG/5ML liquid Take 10 mLs (60 mg total) by mouth 2 (two) times daily as needed for cough. 08/12/19   Johnson, Clanford L, MD  FLUoxetine (PROZAC) 20 MG capsule Take 20 mg by mouth daily. 06/10/19   [provider]  metoprolol succinate (TOPROL-XL) 25 MG 24 hr tablet Take 1 tablet (25 mg total) by mouth daily. 08/12/19 09/11/19  Johnson, Clanford L, MD  pantoprazole (PROTONIX) 40 MG tablet Take 1 tablet (40 mg total) by mouth daily. 08/12/19   Johnson, Clanford L, MD  predniSONE (DELTASONE) 20 MG tablet Take 2 PO QAM x3days, 1 PO QAM x4days 08/13/19   Cleora FleetJohnson, Clanford L, MD  Spacer/Aero Chamber Mouthpiece MISC 1 each by Does not apply route  every 6 (six) hours as needed. Patient not taking: Reported on 08/10/2019 07/20/16   Johna Sheriff, MD  dicyclomine (BENTYL) 20 MG tablet Take 1 tablet (20 mg total) by mouth every 8 (eight) hours as needed for spasms. 12/15/18 06/07/19  Ercia Crisafulli R, PA-C    Allergies    Lisinopril and Sulfa antibiotics  Review of Systems   Review of Systems  Constitutional: Negative for chills and fever.  Respiratory: Negative for cough and shortness of breath.   Cardiovascular: Negative for chest pain.  Gastrointestinal: Positive for abdominal pain, diarrhea and nausea. Negative for anal bleeding, blood in stool, constipation and vomiting.   Genitourinary: Negative for dysuria, hematuria, scrotal swelling and testicular pain.  Neurological: Negative for syncope.  All other systems reviewed and are negative.   Physical Exam Updated Vital Signs BP 131/79   Pulse 88   Temp 98.1 F (36.7 C) (Oral)   Resp 18   Ht 6' (1.829 m)   Wt 120.2 kg   SpO2 98%   BMI 35.94 kg/m   Physical Exam Vitals and nursing note reviewed.  Constitutional:      General: He is not in acute distress.    Appearance: He is well-developed. He is obese. He is not toxic-appearing.  HENT:     Head: Normocephalic and atraumatic.  Eyes:     General:        Right eye: No discharge.        Left eye: No discharge.     Conjunctiva/sclera: Conjunctivae normal.  Cardiovascular:     Rate and Rhythm: Normal rate and regular rhythm.  Pulmonary:     Effort: Pulmonary effort is normal. No respiratory distress.     Breath sounds: Normal breath sounds. No wheezing, rhonchi or rales.  Abdominal:     General: There is no distension.     Palpations: Abdomen is soft.     Tenderness: There is abdominal tenderness (Primarily in the epigastrium as well as the right side of the abdomen, also has some mild suprapubic tenderness). There is no right CVA tenderness, left CVA tenderness, guarding or rebound.     Comments: Scab in the periumbilical region, no significant surrounding erythema, warmth, or purulent drainage.   Musculoskeletal:     Cervical back: Neck supple.  Skin:    General: Skin is warm and dry.     Findings: No rash.  Neurological:     Mental Status: He is alert.     Comments: Clear speech.   Psychiatric:        Behavior: Behavior normal.    ED Results / Procedures / Treatments   Labs (all labs ordered are listed, but only abnormal results are displayed) Labs Reviewed  COMPREHENSIVE METABOLIC PANEL - Abnormal; Notable for the following components:      Result Value   AST 51 (*)    All other components within normal limits  CBC WITH  DIFFERENTIAL/PLATELET - Abnormal; Notable for the following components:   WBC 14.9 (*)    Neutro Abs 11.2 (*)    Monocytes Absolute 1.1 (*)    All other components within normal limits  LIPASE, BLOOD  URINALYSIS, ROUTINE W REFLEX MICROSCOPIC    EKG None  Radiology CT Abdomen Pelvis W Contrast  Result Date: 01/26/2020 CLINICAL DATA:  24 year old male with abdominal pain. EXAM: CT ABDOMEN AND PELVIS WITH CONTRAST TECHNIQUE: Multidetector CT imaging of the abdomen and pelvis was performed using the standard protocol following bolus administration of intravenous contrast.  CONTRAST:  OMNIPAQUE IOHEXOL 300 MG/ML  SOLN COMPARISON:  CT abdomen pelvis dated 07/21/2019. FINDINGS: Lower chest: There is a 3 mm left lung base subpleural nodule. The visualized lung bases are otherwise clear. No intra-abdominal free air or free fluid. Hepatobiliary: There is fatty infiltration of the liver. No intrahepatic biliary dilatation. Cholecystectomy. No retained calcified stone noted in the central CBD. Pancreas: Unremarkable. No pancreatic ductal dilatation or surrounding inflammatory changes. Spleen: Normal in size without focal abnormality. Adrenals/Urinary Tract: The adrenal glands unremarkable. There is a 2 mm nonobstructing right renal inferior pole calculus. No hydronephrosis. The left kidney is unremarkable. The visualized ureters are unremarkable. The urinary bladder is minimally distended and grossly unremarkable. Stomach/Bowel: There is no bowel obstruction or active inflammation. Appendectomy. Vascular/Lymphatic: The abdominal aorta and IVC unremarkable. No portal venous gas. There is no adenopathy. There are small scattered mesenteric lymph nodes. There is a 2.4 x 1.2 cm nodular density in the mesentery (55/2) which was also present on the prior CT and likely a cluster of adjacent lymph nodes. Reproductive: The prostate and seminal vesicles are grossly unremarkable. No pelvic mass. Other: None  Musculoskeletal: No acute or significant osseous findings. IMPRESSION: 1. No acute intra-abdominal or pelvic pathology. 2. Fatty liver. 3. A 2 mm nonobstructing right renal inferior pole calculus. No hydronephrosis. Electronically Signed   By: Elgie Collard M.D.   On: 01/26/2020 15:30    Procedures Procedures (including critical care time)  Medications Ordered in ED Medications  sodium chloride 0.9 % bolus 1,000 mL (1,000 mLs Intravenous New Bag/Given 01/26/20 1333)  ondansetron (ZOFRAN) injection 4 mg (4 mg Intravenous Given 01/26/20 1335)  morphine 4 MG/ML injection 4 mg (4 mg Intravenous Given 01/26/20 1333)    ED Course  I have reviewed the triage vital signs and the nursing notes.  Pertinent labs & imaging results that were available during my care of the patient were reviewed by me and considered in my medical decision making (see chart for details).    MDM Rules/Calculators/A&P                          Patient presents to the ED with complaints of abdominal pain.  Patient is nontoxic, initial tachycardia improved on my exam.  Primarily tender to palpation in the epigastrium area as well as into the right side of the abdomen as well as suprapubic region.  No peritoneal signs.  Additional history obtained:  Additional history obtained from chart review & nursing note review.  Lab Tests:  I Ordered, reviewed, and interpreted labs, which included:  CBC: Leukocytosis  CMP: mild AST elevation, otherwise unremarkable.  Lipase: WNL UA: No UTI  Morphine ordered for pain, zofran for nausea, and fluids for hydration.   Imaging Studies ordered:  I ordered imaging studies which included CT A/P, I independently visualized and interpreted imaging which showed 1. No acute intra-abdominal or pelvic pathology. 2. Fatty liver. 3. A 2 mm nonobstructing right renal inferior pole calculus. No hydronephrosis.  GI cocktail given.  On reassessment patient is feeling much better, pain is  resolved, he is tolerating p.o.  Repeat abdominal exam remains without peritoneal signs I have a low suspicion for perforation, obstruction, diverticulitis, colitis, or general acute surgical process.  Unclear definitive etiology, possibly viral, possibly GERD/PUD, will place on PPI, Carafate, and provide Zofran.  PCP/GI follow-up. I discussed results, treatment plan, need for follow-up, and return precautions with the patient. Provided opportunity for questions, patient  confirmed understanding and is in agreement with plan.   Blood pressure 133/73, pulse 94, temperature 98.1 F (36.7 C), temperature source Oral, resp. rate 18, height 6' (1.829 m), weight 120.2 kg, SpO2 99 %.  Portions of this note were generated with Scientist, clinical (histocompatibility and immunogenetics). Dictation errors may occur despite best attempts at proofreading.  Final Clinical Impression(s) / ED Diagnoses Final diagnoses:  Abdominal pain, unspecified abdominal location    Rx / DC Orders ED Discharge Orders         Ordered    pantoprazole (PROTONIX) 40 MG tablet  Daily        01/26/20 1655    sucralfate (CARAFATE) 1 GM/10ML suspension  3 times daily with meals & bedtime        01/26/20 1655    ondansetron (ZOFRAN ODT) 4 MG disintegrating tablet  Every 8 hours PRN        01/26/20 1655           Raylee Adamec, Hansen, PA-C 01/26/20 1707    Pollyann Savoy, MD 01/27/20 [phone]

## 2020-02-03 NOTE — ED Notes (Signed)
Attempted to reach out to pt again. No answer.

## 2020-02-03 NOTE — ED Notes (Signed)
Pt called and left voicemail with department director regarding kidney stone being listed on discharge instructions but pt stated was never informed of this finding during treatment/discharge. Attempted to follow up with pt, no answer at contact number provided in voicemail.

## 2020-04-01 ENCOUNTER — Ambulatory Visit: Payer: Self-pay | Admitting: Urology

## 2020-04-07 ENCOUNTER — Other Ambulatory Visit: Payer: Self-pay

## 2020-04-07 ENCOUNTER — Ambulatory Visit (INDEPENDENT_AMBULATORY_CARE_PROVIDER_SITE_OTHER): Payer: Medicaid Other | Admitting: Urology

## 2020-04-07 VITALS — BP 136/90 | HR 102 | Ht 72.0 in | Wt 265.0 lb

## 2020-04-07 DIAGNOSIS — Z3009 Encounter for other general counseling and advice on contraception: Secondary | ICD-10-CM | POA: Diagnosis not present

## 2020-04-07 DIAGNOSIS — N529 Male erectile dysfunction, unspecified: Secondary | ICD-10-CM | POA: Diagnosis not present

## 2020-04-07 NOTE — Patient Instructions (Signed)

## 2020-04-08 ENCOUNTER — Encounter: Payer: Self-pay | Admitting: Urology

## 2020-04-08 MED ORDER — SILDENAFIL CITRATE 100 MG PO TABS
ORAL_TABLET | ORAL | 0 refills | Status: DC
Start: 2020-04-08 — End: 2021-02-23

## 2020-04-08 MED ORDER — DIAZEPAM 10 MG PO TABS
ORAL_TABLET | ORAL | 0 refills | Status: DC
Start: 2020-04-08 — End: 2020-08-15

## 2020-04-08 NOTE — Progress Notes (Signed)
04/07/2020 7:41 AM   Logan Dawson April 22, 1995 [id number]  Referring provider: [facility] [address] [place],  [place] [address]  Chief Complaint  Patient presents with  . VAS Consult    HPI: Logan Dawson is a 25 y.o. male who presents for vasectomy counseling.  . Single with 1 child and does not desire additional children . Denies prior history urologic problems including chronic scrotal pain, epididymitis or orchitis . No previous history inguinal/pelvic hernia . No history of bleeding or clotting disorders . Also complains of ED and was given Rx sildenafil 20 mg which has not been effective   PMH: Past Medical History:  Diagnosis Date  . ADHD (attention deficit hyperactivity disorder)   . Asthma   . Gastroenteritis   . Hypertension   . Left shoulder pain   . Meningitis   . Skull fracture (HCC)   . Subarachnoid hemorrhage [facility])     Surgical History: Past Surgical History:  Procedure Laterality Date  . APPENDECTOMY    . CHOLECYSTECTOMY    . HERNIA REPAIR      Home Medications:  Allergies as of 04/07/2020      Reactions   Lisinopril    Sulfa Antibiotics Hives      Medication List       Accurate as of April 07, 2020 11:59 PM. If you have any questions, ask your nurse or doctor.        STOP taking these medications   ondansetron 4 MG disintegrating tablet Commonly known as: Zofran ODT Stopped by: Riki Altes, MD     TAKE these medications   albuterol 108 (90 Base) MCG/ACT inhaler Commonly known as: VENTOLIN HFA Inhale 1 puff into the lungs every 4 (four) hours as needed for wheezing or shortness of breath (cough).   FLUoxetine 20 MG capsule Commonly known as: PROZAC Take 20 mg by mouth daily.   metoprolol succinate 25 MG 24 hr tablet Commonly known as: TOPROL-XL Take 1 tablet (25 mg total) by mouth daily.   pantoprazole 40 MG tablet Commonly known as: Protonix Take 1 tablet (40 mg total) by mouth daily.    sildenafil 20 MG tablet Commonly known as: REVATIO Take 20 mg by mouth daily.   sucralfate 1 GM/10ML suspension Commonly known as: Carafate Take 10 mLs (1 g total) by mouth 4 (four) times daily -  with meals and at bedtime.   Vitamin D3 1.25 MG (50000 UT) Caps 1 capsule       Allergies:  Allergies  Allergen Reactions  . Lisinopril   . Sulfa Antibiotics Hives    Family History: Family History  Problem Relation Age of Onset  . Diabetes Mother   . COPD Mother   . Diabetes Father   . Hypertension Father   . Heart disease Father     Social History:  reports that he has never smoked. He has never used smokeless tobacco. He reports current alcohol use. He reports that he does not use drugs.   Physical Exam: BP 136/90   Pulse (!) 102   Ht 6' (1.829 m)   Wt 265 lb (120.2 kg)   BMI 35.94 kg/m   Constitutional:  Alert and oriented, No acute distress. HEENT: [place] AT, moist mucus membranes.  Trachea midline, no masses. Cardiovascular: No clubbing, cyanosis, or edema. Respiratory: Normal respiratory effort, no increased work of breathing. GI: Abdomen is soft, nontender, nondistended, no abdominal masses GU: Phallus without lesions, testes descended bilaterally without masses  or tenderness, spermatic cord/epididymis palpably thick bilaterally.  Vasa present but difficult to palpate due to cord thickness Skin: No rashes, bruises or suspicious lesions. Neurologic: Grossly intact, no focal deficits, moving all 4 extremities. Psychiatric: Normal mood and affect.   Assessment & Plan:    1.  Undesired fertility . Desires to schedule vasectomy . We had a long discussion about vasectomy. We specifically discussed the procedure, recovery and the risks, benefits and alternatives of vasectomy. I explained that the procedure entails removal of a segment of each vas deferens, each of which conducts sperm, and that the purpose of this procedure is to cause sterility (inability to produce  children or cause pregnancy). Vasectomy is intended to be permanent and irreversible form of contraception. Options for fertility after vasectomy include vasectomy reversal, or sperm retrieval with in vitro fertilization. These options are not always successful, and they may be expensive. We discussed reversible forms of birth control such as condoms, IUD or diaphragms, as well as the option of freezing sperm in a sperm bank prior to the vasectomy procedure. We discussed the importance of avoiding strenuous exercise for four days after vasectomy, and the importance of refraining from any form of ejaculation for seven days after vasectomy. I explained that vasectomy does not produce immediate sterility so another form of contraceptive must be used until sterility is assured by having semen checked for sperm. Thus, a post vasectomy semen analysis is necessary to confirm sterility. Rarely, vasectomy must be repeated. We discussed the approximately 1 in 2,000 risk of pregnancy after vasectomy for men who have post-vasectomy semen analysis showing absent sperm or rare non-motile sperm. Typical side effects include a small amount of oozing blood, some discomfort and mild swelling in the area of incision.  Vasectomy does not affect sexual performance, function, please, sensation, interest, desire, satisfaction, penile erection, volume of semen or ejaculation. Other rare risks include allergy or adverse reaction to an anesthetic, testicular atrophy, hematoma, infection/abscess, prolonged tenderness of the vas deferens, pain, swelling, painful nodule or scar (called sperm granuloma) or epididymtis. We discussed chronic testicular pain syndrome. This has been reported to occur in as many as 1-2% of men and may be permanent. This can be treated with medication, small procedures or (rarely) surgery. . Valium 10 mg as a preprocedure anxiolytic sent to pharmacy and he was informed he would need a driver if utilizing this  medication  2.  Erectile dysfunction  Rx sildenafil 100 mg sent to pharmacy   Riki Altes, MD  [facility] [address] [place], [place] [address] [phone]

## 2020-04-15 ENCOUNTER — Other Ambulatory Visit: Payer: Self-pay

## 2020-04-15 ENCOUNTER — Emergency Department (HOSPITAL_COMMUNITY)
Admission: EM | Admit: 2020-04-15 | Discharge: 2020-04-15 | Disposition: A | Discharge status: Home / Self Care | Payer: Medicaid Other | Attending: Emergency Medicine | Admitting: Emergency Medicine

## 2020-04-15 ENCOUNTER — Emergency Department (HOSPITAL_COMMUNITY): Payer: Medicaid Other

## 2020-04-15 ENCOUNTER — Encounter (HOSPITAL_COMMUNITY): Payer: Self-pay

## 2020-04-15 DIAGNOSIS — R61 Generalized hyperhidrosis: Secondary | ICD-10-CM | POA: Insufficient documentation

## 2020-04-15 DIAGNOSIS — Z20822 Contact with and (suspected) exposure to covid-19: Secondary | ICD-10-CM | POA: Insufficient documentation

## 2020-04-15 DIAGNOSIS — R059 Cough, unspecified: Secondary | ICD-10-CM | POA: Insufficient documentation

## 2020-04-15 DIAGNOSIS — J45909 Unspecified asthma, uncomplicated: Secondary | ICD-10-CM | POA: Insufficient documentation

## 2020-04-15 DIAGNOSIS — R0789 Other chest pain: Secondary | ICD-10-CM

## 2020-04-15 DIAGNOSIS — R202 Paresthesia of skin: Secondary | ICD-10-CM | POA: Insufficient documentation

## 2020-04-15 DIAGNOSIS — I10 Essential (primary) hypertension: Secondary | ICD-10-CM | POA: Insufficient documentation

## 2020-04-15 DIAGNOSIS — Z79899 Other long term (current) drug therapy: Secondary | ICD-10-CM | POA: Insufficient documentation

## 2020-04-15 LAB — BASIC METABOLIC PANEL
Anion gap: 7 (ref 5–15)
BUN: 12 mg/dL (ref 6–20)
CO2: 29 mmol/L (ref 22–32)
Calcium: 9.2 mg/dL (ref 8.9–10.3)
Chloride: 100 mmol/L (ref 98–111)
Creatinine, Ser: 0.91 mg/dL (ref 0.61–1.24)
GFR, Estimated: >60 mL/min (ref >60)
Glucose, Bld: 116 mg/dL — ABNORMAL HIGH (ref 70–99)
Potassium: 3.9 mmol/L (ref 3.5–5.1)
Sodium: 136 mmol/L (ref 135–145)

## 2020-04-15 LAB — CBC
HCT: 48.1 % (ref 39.0–52.0)
Hemoglobin: 16.4 g/dL (ref 13.0–17.0)
MCH: 29.6 pg (ref 26.0–34.0)
MCHC: 34.1 g/dL (ref 30.0–36.0)
MCV: 86.8 fL (ref 80.0–100.0)
Platelets: 268 10*3/uL (ref 150–400)
RBC: 5.54 MIL/uL (ref 4.22–5.81)
RDW: 12.6 % (ref 11.5–15.5)
WBC: 10.7 10*3/uL — ABNORMAL HIGH (ref 4.0–10.5)
nRBC: 0 % (ref 0.0–0.2)

## 2020-04-15 LAB — HEPATIC FUNCTION PANEL
ALT: 39 U/L (ref 0–44)
AST: 59 U/L — ABNORMAL HIGH (ref 15–41)
Albumin: 3.8 g/dL (ref 3.5–5.0)
Alkaline Phosphatase: 70 U/L (ref 38–126)
Bilirubin, Direct: 0.2 mg/dL (ref 0.0–0.2)
Indirect Bilirubin: 0.4 mg/dL (ref 0.3–0.9)
Total Bilirubin: 0.6 mg/dL (ref 0.3–1.2)
Total Protein: 7.1 g/dL (ref 6.5–8.1)

## 2020-04-15 LAB — RESP PANEL BY RT-PCR (FLU A&B, COVID) ARPGX2
Influenza A by PCR: NEGATIVE
Influenza B by PCR: NEGATIVE
SARS Coronavirus 2 by RT PCR: NEGATIVE

## 2020-04-15 LAB — BRAIN NATRIURETIC PEPTIDE: B Natriuretic Peptide: 10.7 pg/mL (ref 0.0–100.0)

## 2020-04-15 LAB — TROPONIN I (HIGH SENSITIVITY)
Troponin I (High Sensitivity): 7 ng/L (ref <18)
Troponin I (High Sensitivity): 8 ng/L (ref <18)
Troponin I (High Sensitivity): 8 ng/L (ref <18)

## 2020-04-15 LAB — LIPASE, BLOOD: Lipase: 38 U/L (ref 11–51)

## 2020-04-15 LAB — D-DIMER, QUANTITATIVE: D-Dimer, Quant: 0.27 ug/mL-FEU (ref 0.00–0.50)

## 2020-04-15 MED ORDER — BENZONATATE 100 MG PO CAPS: 100.0000 mg | ORAL_CAPSULE | Freq: Three times a day (TID) | ORAL | 0 refills | Status: DC

## 2020-04-15 MED ORDER — BENZONATATE 100 MG PO CAPS
100.0000 mg | ORAL_CAPSULE | Freq: Once | ORAL | Status: AC
  Administered 2020-04-15: 100 mg via ORAL
  Filled 2020-04-15: qty 1

## 2020-04-15 NOTE — ED Provider Notes (Signed)
[facility] EMERGENCY DEPARTMENT Provider Note   CSN: [id number] Arrival date & time: 04/15/20  0865     History Chief Complaint  Patient presents with  . Chest Pain    Logan Dawson is a 25 y.o. male with a past medical history significant for ADHD, asthma, and hypertension who presents to the ED due to left-sided chest pain x3 days.  Patient describes chest pain as a sharp and aching sensation that radiates from the left side to substernal region.  Chest pain associated with intermittent diaphoresis and numbness/tingling down bilateral upper extremities.  Chest pain typically lasts 30 to 45 minutes.  No relationship to exertion or change in position.  Patient also admits to a productive cough with yellow phlegm.  Chest pain worse when coughing.  Denies fever and chills.  Patient has received his first COVID vaccine.  Denies sick contacts and known Covid exposures.  Denies history of blood clots, recent surgeries, recent long immobilizations, hormonal treatments, lower extremity edema.  Patient admits to previous episodes of chest pain.  He was evaluated by [facility] cardiology in 2015 where an echocardiogram was performed which patient notes was normal (unable to view results in chart).  No recent illness.  Patient denies tobacco, alcohol, and drug use.  He admits to a family history of early CAD.  Denies associated shortness of breath and lower extremity edema.    History obtained from patient and past medical records. No interpreter used during encounter.   HPI: A 25 year old patient with a history of hypertension and obesity presents for evaluation of chest pain. Initial onset of pain was less than one hour ago. The patient's chest pain is sharp and is not worse with exertion. The patient reports some diaphoresis. The patient's chest pain is middle- or left-sided, is not well-localized, is not described as heaviness/pressure/tightness and does not radiate to the arms/jaw/neck.  The patient does not complain of nausea. The patient has a family history of coronary artery disease in a first-degree relative with onset less than age 71. The patient has no history of stroke, has no history of peripheral artery disease, has not smoked in the past 90 days, denies any history of treated diabetes and has no history of hypercholesterolemia.   Past Medical History:  Diagnosis Date  . ADHD (attention deficit hyperactivity disorder)   . Asthma   . Gastroenteritis   . Hypertension   . Left shoulder pain   . Meningitis   . Skull fracture (HCC)   . Subarachnoid hemorrhage [facility])     Patient Active Problem List   Diagnosis Date Noted  . CAP (community acquired pneumonia) 08/11/2019  . Community acquired pneumonia 08/10/2019  . Sepsis due to pneumonia (HCC) 08/10/2019  . Gastroenteritis 04/14/2018  . Black-out (not amnesia) 11/27/2012  . Concussion with < 1 hr loss of consciousness 11/27/2012  . Hearing voices 11/27/2012  . Skull fracture with concussion (HCC) 11/27/2012  . Subarachnoid hemorrhage (HCC) 11/27/2012  . Unable to control anger 11/27/2012  . Abrasions of multiple sites 11/14/2012  . Linear skull fracture (HCC) 11/14/2012  . SAH (subarachnoid hemorrhage) (HCC) 11/14/2012  . Trauma 11/14/2012    Past Surgical History:  Procedure Laterality Date  . APPENDECTOMY    . CHOLECYSTECTOMY    . HERNIA REPAIR         Family History  Problem Relation Age of Onset  . Diabetes Mother   . COPD Mother   . Diabetes Father   . Hypertension Father   .  Heart disease Father     Social History   Tobacco Use  . Smoking status: Never Smoker  . Smokeless tobacco: Never Used  Vaping Use  . Vaping Use: Never used  Substance Use Topics  . Alcohol use: Yes    Comment: occ.  . Drug use: No    Home Medications Prior to Admission medications   Medication Sig Start Date End Date Taking? Authorizing Provider  albuterol (VENTOLIN HFA) 108 (90 Base) MCG/ACT inhaler  Inhale 1 puff into the lungs every 4 (four) hours as needed for wheezing or shortness of breath (cough). 08/12/19  Yes Johnson, Clanford L, MD  benzonatate (TESSALON) 100 MG capsule Take 1 capsule (100 mg total) by mouth every 8 (eight) hours. 04/15/20  Yes Rykin Route, Merla Riches, PA-C  cholecalciferol (VITAMIN D3) 25 MCG (1000 UNIT) tablet Take 1,000 Units by mouth daily.   Yes [provider]  metoprolol succinate (TOPROL-XL) 25 MG 24 hr tablet Take 1 tablet (25 mg total) by mouth daily. 08/12/19 09/11/19 Yes Johnson, Clanford L, MD  pantoprazole (PROTONIX) 40 MG tablet Take 1 tablet (40 mg total) by mouth daily. 01/26/20  Yes Petrucelli, Samantha R, PA-C  sildenafil (VIAGRA) 100 MG tablet 1 tablet by mouth 1 hour prior to intercourse Patient taking differently: Take 100 mg by mouth daily as needed for erectile dysfunction (1 hour prior to intercourse). 04/08/20  Yes Stoioff, Verna Czech, MD  diazepam (VALIUM) 10 MG tablet 1 tab po 30 min prior to procedure Patient not taking: No sig reported 04/08/20   Stoioff, Verna Czech, MD  FLUoxetine (PROZAC) 20 MG capsule Take 20 mg by mouth daily. 06/10/19   [provider]  sucralfate (CARAFATE) 1 GM/10ML suspension Take 10 mLs (1 g total) by mouth 4 (four) times daily -  with meals and at bedtime. 01/26/20   Petrucelli, Samantha R, PA-C  dicyclomine (BENTYL) 20 MG tablet Take 1 tablet (20 mg total) by mouth every 8 (eight) hours as needed for spasms. 12/15/18 06/07/19  Petrucelli, Samantha R, PA-C    Allergies    Lisinopril and Sulfa antibiotics  Review of Systems   Review of Systems  Constitutional: Negative for chills and fever.  Respiratory: Positive for cough. Negative for shortness of breath.   Cardiovascular: Positive for chest pain. Negative for leg swelling.  Gastrointestinal: Negative for abdominal pain, diarrhea, nausea and vomiting.  All other systems reviewed and are negative.   Physical Exam Updated Vital Signs BP 130/86 (BP Location:  Right Arm)   Pulse 84   Temp 98 F (36.7 C)   Resp (!) 22   Ht 6' (1.829 m)   Wt 120 kg   SpO2 99%   BMI 35.88 kg/m   Physical Exam Vitals and nursing note reviewed.  Constitutional:      General: He is not in acute distress. HENT:     Head: Normocephalic.  Eyes:     Pupils: Pupils are equal, round, and reactive to light.  Cardiovascular:     Rate and Rhythm: Normal rate and regular rhythm.     Pulses: Normal pulses.     Heart sounds: Normal heart sounds. No murmur heard. No friction rub. No gallop.   Pulmonary:     Effort: Pulmonary effort is normal.     Breath sounds: Normal breath sounds.     Comments: Respirations equal and unlabored, patient able to speak in full sentences, lungs clear to auscultation bilaterally Abdominal:     General: Abdomen is flat. Bowel  sounds are normal. There is no distension.     Palpations: Abdomen is soft.     Tenderness: There is abdominal tenderness. There is no guarding or rebound.     Comments: Tenderness in epigastric region with no rebound or guarding  Musculoskeletal:     Cervical back: Neck supple.     Comments: 1+ pitting edema bilaterally  Skin:    General: Skin is warm and dry.  Neurological:     General: No focal deficit present.  Psychiatric:        Mood and Affect: Mood normal.        Behavior: Behavior normal.     ED Results / Procedures / Treatments   Labs (all labs ordered are listed, but only abnormal results are displayed) Labs Reviewed  BASIC METABOLIC PANEL - Abnormal; Notable for the following components:      Result Value   Glucose, Bld 116 (*)    All other components within normal limits  CBC - Abnormal; Notable for the following components:   WBC 10.7 (*)    All other components within normal limits  HEPATIC FUNCTION PANEL - Abnormal; Notable for the following components:   AST 59 (*)    All other components within normal limits  RESP PANEL BY RT-PCR (FLU A&B, COVID) ARPGX2  D-DIMER, QUANTITATIVE   LIPASE, BLOOD  BRAIN NATRIURETIC PEPTIDE  TROPONIN I (HIGH SENSITIVITY)  TROPONIN I (HIGH SENSITIVITY)  TROPONIN I (HIGH SENSITIVITY)    EKG None  Radiology DG Chest 2 View  Result Date: 04/15/2020 CLINICAL DATA:  Chest pain EXAM: CHEST - 2 VIEW COMPARISON:  01/03/2020 FINDINGS: The heart size and mediastinal contours are within normal limits. Both lungs are clear. The visualized skeletal structures are unremarkable. IMPRESSION: No active cardiopulmonary disease. Electronically Signed   By: Helyn Numbers MD   On: 04/15/2020 06:40    Procedures Procedures   Medications Ordered in ED Medications  benzonatate (TESSALON) capsule 100 mg (100 mg Oral Given 04/15/20 1023)    ED Course  I have reviewed the triage vital signs and the nursing notes.  Pertinent labs & imaging results that were available during my care of the patient were reviewed by me and considered in my medical decision making (see chart for details).  Clinical Course as of 04/15/20 1038  Fri Apr 15, 2020  0719 WBC(!): 10.7 [CA]  0909 D-Dimer, Quant: 0.27 [CA]    Clinical Course User Index [CA] Mannie Stabile, PA-C   MDM Rules/Calculators/A&P HEAR Score: 2                       25 year old male presents to the ED due to intermittent episodes of left-sided CP x 3 days associated with intermittent diaphoresis. He also admits to a productive cough with yellow phlegm. Denies history of blood clots, recent surgeries, recent long immobilizations, and hormonal treatments.  Patient notes he has had previous episodes of chest pain and has been evaluated by cardiology years ago with unremarkable findings per the patient.  Upon arrival, patient is afebrile, tachycardic at 107 with otherwise reassuring vitals. Patient in no acute distress and non-toxic appearing. Physical exam significant for 1+ pitting edema bilaterally. Negative homan sign bilaterally. No calf tenderness. Tenderness in epigastric region without rebound or  guarding. No reproducible tenderness throughout anterior chest wall. Lungs clear to auscultation bilaterally with wheeze, rales, or rhonchi. Routine labs ordered at triage. Hepatic function panel and lipase added to rule out  pancreatitis and liver/gallbladder etiology given epigastric tenderness. D-dimer to rule out DVT/PE due to tachycardia. COVID test to rule out infection.  BNP to rule out CHF due to bilateral lower extremity edema.   EKG personally reviewed which demonstrates sinus tachycardia with no signs of acute ischemia.  Chest x-ray personally reviewed which is negative for signs of pneumonia, pneumothorax, or widened mediastinum.  CBC significant for leukocytosis at 10.7, but otherwise unremarkable.  Initial troponin normal at 8.  Given most recent onset was less than 1 hour ago will obtain delta troponin to rule out ACS.  BMP significant for hyperglycemia at 116 with no anion gap.  Doubt DKA.  Normal renal function no major electrolyte derangements.  D-dimer normal.  Doubt PE/DVT.  BNP normal.  Doubt CHF.  Hepatic function panel significant for elevated AST of 59, but otherwise unremarkable.  Lipase normal at 38.  Doubt pancreatitis.  Low suspicion for acute abdomen at this time. COVID negative.  Delta troponin flat.  Doubt ACS.  Dissection was considered but thought to be less likely given presentation.  Advised patient to take over-the-counter ibuprofen or Tylenol as needed for pain. Suspect cough related to viral etiology. Patient advised to follow-up with his cardiologist for further evaluation given strong family history of early CAD. Strict ED precautions discussed with patient. Patient states understanding and agrees to plan. Patient discharged home in no acute distress and stable vitals. Final Clinical Impression(s) / ED Diagnoses Final diagnoses:  Atypical chest pain  Cough    Rx / DC Orders ED Discharge Orders         Ordered    benzonatate (TESSALON) 100 MG capsule  Every 8 hours         04/15/20 0931           Mannie Stabileberman, Josemanuel Eakins C, PA-C 04/15/20 1043    Arby BarrettePfeiffer, Marcy, MD 04/15/20 1117

## 2020-04-15 NOTE — ED Triage Notes (Signed)
Left sided chest pain (nonradiating)  and nonproductive cough x few days. Hx of HTN

## 2020-04-15 NOTE — ED Notes (Signed)
Patient transported to X-ray 

## 2020-04-15 NOTE — Discharge Instructions (Addendum)
As discussed, all of your labs were reassuring today.  Your cardiac markers were normal.  Your chest x-ray did not show any signs of pneumonia.  Your Covid test is negative.  I suspect you have a viral infection causing your cough.  I am sending you home with cough medication.  I have included the number of your cardiologist.  Please call today to schedule an appointment for further evaluation of your chest pain given your family history.  Return to the ER for new or worsening symptoms

## 2020-05-04 ENCOUNTER — Ambulatory Visit: Payer: Self-pay | Admitting: Urology

## 2020-05-12 ENCOUNTER — Ambulatory Visit (INDEPENDENT_AMBULATORY_CARE_PROVIDER_SITE_OTHER): Payer: Medicaid Other | Admitting: Urology

## 2020-05-12 ENCOUNTER — Other Ambulatory Visit: Payer: Self-pay

## 2020-05-12 ENCOUNTER — Encounter: Payer: Self-pay | Admitting: Urology

## 2020-05-12 VITALS — BP 139/83 | HR 125 | Ht 72.0 in | Wt 253.0 lb

## 2020-05-12 DIAGNOSIS — Z9852 Vasectomy status: Secondary | ICD-10-CM

## 2020-05-12 DIAGNOSIS — Z302 Encounter for sterilization: Secondary | ICD-10-CM

## 2020-05-12 MED ORDER — HYDROCODONE-ACETAMINOPHEN 5-325 MG PO TABS: 1.0000 | ORAL_TABLET | Freq: Four times a day (QID) | ORAL | 0 refills | Status: DC | PRN

## 2020-05-12 NOTE — Patient Instructions (Addendum)
Vasectomy Postoperative Instructions  What to Expect  - slight redness, swelling and scant drainage along the incision  - mild to moderate discomfort  - black and blue (bruising) as the tissue heals  - low grade fever  - scrotal sensitivity and/or tenderness - Edges of the incision may pull apart and heal slowly, sometimes a knot may be present which remains for several months.  This is NORMAL and all part of the healing process. - if stitches are placed, they do not need to be removed - if you have pain or discomfort immediately after the vasectomy, you may use OTC pain medication for relief , ex: tylenol.  After local anesthetic wears off an ice pack will provide additional comfort and can also prevent swelling if used  Activity  - no sexual intercourse for at lease 5 days depending on comfort  - no heavy lifting for 48-72 hours (anything over 5-10 lbs)  Wound Care  - shower only after 24 hours  - no tub baths, hot tub, or pools for at least 7 days  - ice packs for 48 hours: 30 minutes on and 30 minutes off  Problem to Report  - generalized redness  - increased pain and swelling  - fever greater than 101 F  - significant drainage or bleeding from the wound  TO DO - Ejaculations help to clear the passage of sperm, but you must use another from of birth control until you are told you may discontinue its use!! - You will be given a specimen cup to bring back a semen sample in 3 months to check and see if its clear of sperm.  Only after the semen is sent for analysis and is reported back as clear should you use this as your primary form of birth control!  

## 2020-05-13 ENCOUNTER — Encounter: Payer: Self-pay | Admitting: Urology

## 2020-05-13 NOTE — Progress Notes (Signed)
Vasectomy Procedure Note  Indications: The patient is a 25 y.o. male who presents today for elective sterilization.  He has been consented for the procedure.  He is aware of the risks and benefits.  He had no additional questions.  He agrees to proceed.  He denies any other significant change since his last visit.  Pre-operative Diagnosis: Elective sterilization  Post-operative Diagnosis: Elective sterilization  Premedication: Valium 10 mg po  Surgeon: Lorin Picket C. Quavis Klutz, M.D  Description: The patient was prepped and draped in the standard fashion.  The right vas deferens was identified and brought superiorly to the anterior scrotal skin.  The skin and vas was then anesthetized utilizing 7 ml 1% lidocaine.  A small stab incision was made and spread with the vas dissector.  The vas was grasped utilizing the vas clamp and elevated out of the incision.  The vas was dissected free from surrounding tissue and vessels and an ~1 cm segment was excised.  The vas lumens were cauterized utilizing electrocautery.  The distal segment was buried in the surrounding sheath with a 3-0 chromic suture.  No significant bleeding was observed.  The vas ends were then dropped back into the hemiscrotum.  The skin was closed with hemostatic pressure.  An identical procedure was performed on the contralateral side.  Clean dry gauze was applied to the incision sites.  The patient tolerated the procedure well.  Complications:None  Recommendations: 1.  No lifting greater than 10 pounds or strenuousactivity for 1 week. 2.  Scrotal support for 1 week. 3.  Shower only for 1 week; may shower in the morning 4.  May resume intercourse in one week if no significant discomfort.  Continue alternate contraception for 12 weeks.  5.  Call for significant pain, swelling, redness, drainage or fever greater than 100.5. 6.  Rx hydrocodone/APAP 5/325 1-2 every 6 hours as needed for pain. 7.  Follow-up semen analysis in 12 weeks.

## 2020-05-15 ENCOUNTER — Encounter: Payer: Self-pay | Admitting: Urology

## 2020-05-23 ENCOUNTER — Emergency Department (HOSPITAL_COMMUNITY)
Admission: EM | Admit: 2020-05-23 | Discharge: 2020-05-23 | Disposition: A | Discharge status: Home / Self Care | Payer: Medicaid Other | Attending: Emergency Medicine | Admitting: Emergency Medicine

## 2020-05-23 ENCOUNTER — Emergency Department (HOSPITAL_COMMUNITY): Payer: Medicaid Other

## 2020-05-23 ENCOUNTER — Other Ambulatory Visit: Payer: Self-pay

## 2020-05-23 ENCOUNTER — Encounter (HOSPITAL_COMMUNITY): Payer: Self-pay | Admitting: *Deleted

## 2020-05-23 DIAGNOSIS — I10 Essential (primary) hypertension: Secondary | ICD-10-CM | POA: Insufficient documentation

## 2020-05-23 DIAGNOSIS — R1110 Vomiting, unspecified: Secondary | ICD-10-CM | POA: Insufficient documentation

## 2020-05-23 DIAGNOSIS — J111 Influenza due to unidentified influenza virus with other respiratory manifestations: Secondary | ICD-10-CM

## 2020-05-23 DIAGNOSIS — Z28311 Partially vaccinated for covid-19: Secondary | ICD-10-CM | POA: Insufficient documentation

## 2020-05-23 DIAGNOSIS — J101 Influenza due to other identified influenza virus with other respiratory manifestations: Secondary | ICD-10-CM | POA: Insufficient documentation

## 2020-05-23 DIAGNOSIS — Z20822 Contact with and (suspected) exposure to covid-19: Secondary | ICD-10-CM | POA: Insufficient documentation

## 2020-05-23 DIAGNOSIS — R000 Tachycardia, unspecified: Secondary | ICD-10-CM | POA: Insufficient documentation

## 2020-05-23 DIAGNOSIS — J45909 Unspecified asthma, uncomplicated: Secondary | ICD-10-CM | POA: Insufficient documentation

## 2020-05-23 LAB — RESP PANEL BY RT-PCR (FLU A&B, COVID) ARPGX2
Influenza A by PCR: POSITIVE — AB
Influenza B by PCR: NEGATIVE
SARS Coronavirus 2 by RT PCR: NEGATIVE

## 2020-05-23 LAB — BASIC METABOLIC PANEL
Anion gap: 9 (ref 5–15)
BUN: 8 mg/dL (ref 6–20)
CO2: 28 mmol/L (ref 22–32)
Calcium: 8.8 mg/dL — ABNORMAL LOW (ref 8.9–10.3)
Chloride: 98 mmol/L (ref 98–111)
Creatinine, Ser: 0.99 mg/dL (ref 0.61–1.24)
GFR, Estimated: >60 mL/min (ref >60)
Glucose, Bld: 82 mg/dL (ref 70–99)
Potassium: 3.7 mmol/L (ref 3.5–5.1)
Sodium: 135 mmol/L (ref 135–145)

## 2020-05-23 LAB — CBC WITH DIFFERENTIAL/PLATELET
Abs Immature Granulocytes: 0.04 10*3/uL (ref 0.00–0.07)
Basophils Absolute: 0.1 10*3/uL (ref 0.0–0.1)
Basophils Relative: 1 %
Eosinophils Absolute: 0.2 10*3/uL (ref 0.0–0.5)
Eosinophils Relative: 2 %
HCT: 46.6 % (ref 39.0–52.0)
Hemoglobin: 15.9 g/dL (ref 13.0–17.0)
Immature Granulocytes: 0 %
Lymphocytes Relative: 28 %
Lymphs Abs: 2.6 10*3/uL (ref 0.7–4.0)
MCH: 29.7 pg (ref 26.0–34.0)
MCHC: 34.1 g/dL (ref 30.0–36.0)
MCV: 86.9 fL (ref 80.0–100.0)
Monocytes Absolute: 1 10*3/uL (ref 0.1–1.0)
Monocytes Relative: 11 %
Neutro Abs: 5.7 10*3/uL (ref 1.7–7.7)
Neutrophils Relative %: 58 %
Platelets: 259 10*3/uL (ref 150–400)
RBC: 5.36 MIL/uL (ref 4.22–5.81)
RDW: 12.7 % (ref 11.5–15.5)
WBC: 9.6 10*3/uL (ref 4.0–10.5)
nRBC: 0 % (ref 0.0–0.2)

## 2020-05-23 MED ORDER — BENZONATATE 100 MG PO CAPS: 100.0000 mg | ORAL_CAPSULE | Freq: Three times a day (TID) | ORAL | 0 refills | Status: DC

## 2020-05-23 MED ORDER — BENZONATATE 100 MG PO CAPS
200.0000 mg | ORAL_CAPSULE | Freq: Once | ORAL | Status: AC
  Administered 2020-05-23: 200 mg via ORAL
  Filled 2020-05-23: qty 2

## 2020-05-23 MED ORDER — SODIUM CHLORIDE 0.9 % IV BOLUS
1000.0000 mL | Freq: Once | INTRAVENOUS | Status: AC
  Administered 2020-05-23: 1000 mL via INTRAVENOUS

## 2020-05-23 MED ORDER — OSELTAMIVIR PHOSPHATE 75 MG PO CAPS: 75.0000 mg | ORAL_CAPSULE | Freq: Two times a day (BID) | ORAL | 0 refills | Status: DC

## 2020-05-23 NOTE — ED Provider Notes (Signed)
[facility] EMERGENCY DEPARTMENT Provider Note   CSN: [id number] Arrival date & time: 05/23/20  1555     History Chief Complaint  Patient presents with  . Shortness of Breath    Logan Dawson is a 25 y.o. male.  He is here with a complaint of 4 days of nonproductive cough, fevers and chills, and one episode of vomiting today.  He said he went to [facility] yesterday but "they did not do anything" for him.  He does not have any abdominal pain or chest pain.  He said his throat hurts from coughing.  He has had 1 COVID vaccination.  Denies any sick contacts or recent travel.  Non-smoker.  The history is provided by the patient.  Shortness of Breath Severity:  Moderate Onset quality:  Gradual Timing:  Intermittent Progression:  Worsening Chronicity:  New Context: activity   Relieved by:  Nothing Worsened by:  Activity and coughing Ineffective treatments:  None tried Associated symptoms: cough, fever, sore throat and vomiting   Associated symptoms: no abdominal pain, no chest pain, no headaches, no hemoptysis, no neck pain, no rash and no sputum production   Risk factors: no tobacco use        Past Medical History:  Diagnosis Date  . ADHD (attention deficit hyperactivity disorder)   . Asthma   . Gastroenteritis   . Hypertension   . Left shoulder pain   . Meningitis   . Skull fracture (HCC)   . Subarachnoid hemorrhage [facility])     Patient Active Problem List   Diagnosis Date Noted  . CAP (community acquired pneumonia) 08/11/2019  . Community acquired pneumonia 08/10/2019  . Sepsis due to pneumonia (HCC) 08/10/2019  . Gastroenteritis 04/14/2018  . Black-out (not amnesia) 11/27/2012  . Concussion with < 1 hr loss of consciousness 11/27/2012  . Hearing voices 11/27/2012  . Skull fracture with concussion (HCC) 11/27/2012  . Subarachnoid hemorrhage (HCC) 11/27/2012  . Unable to control anger 11/27/2012  . Abrasions of multiple sites 11/14/2012  . Linear skull fracture  (HCC) 11/14/2012  . SAH (subarachnoid hemorrhage) (HCC) 11/14/2012  . Trauma 11/14/2012    Past Surgical History:  Procedure Laterality Date  . APPENDECTOMY    . CHOLECYSTECTOMY    . HERNIA REPAIR         Family History  Problem Relation Age of Onset  . Diabetes Mother   . COPD Mother   . Diabetes Father   . Hypertension Father   . Heart disease Father     Social History   Tobacco Use  . Smoking status: Never Smoker  . Smokeless tobacco: Never Used  Vaping Use  . Vaping Use: Never used  Substance Use Topics  . Alcohol use: Yes    Comment: occ.  . Drug use: No    Home Medications Prior to Admission medications   Medication Sig Start Date End Date Taking? Authorizing Provider  albuterol (VENTOLIN HFA) 108 (90 Base) MCG/ACT inhaler Inhale 1 puff into the lungs every 4 (four) hours as needed for wheezing or shortness of breath (cough). 08/12/19   Johnson, Clanford L, MD  cholecalciferol (VITAMIN D3) 25 MCG (1000 UNIT) tablet Take 1,000 Units by mouth daily.    [provider]  diazepam (VALIUM) 10 MG tablet 1 tab po 30 min prior to procedure 04/08/20   Stoioff, Verna Czech, MD  HYDROcodone-acetaminophen (NORCO/VICODIN) 5-325 MG tablet Take 1 tablet by mouth every 6 (six) hours as needed for moderate pain. 05/12/20   Stoioff,  Verna Czech, MD  pantoprazole (PROTONIX) 40 MG tablet Take 1 tablet (40 mg total) by mouth daily. 01/26/20   Petrucelli, Samantha R, PA-C  sildenafil (VIAGRA) 100 MG tablet 1 tablet by mouth 1 hour prior to intercourse Patient taking differently: Take 100 mg by mouth daily as needed for erectile dysfunction (1 hour prior to intercourse). 04/08/20   Stoioff, Verna Czech, MD  sucralfate (CARAFATE) 1 GM/10ML suspension Take 10 mLs (1 g total) by mouth 4 (four) times daily -  with meals and at bedtime. 01/26/20   Petrucelli, Samantha R, PA-C  dicyclomine (BENTYL) 20 MG tablet Take 1 tablet (20 mg total) by mouth every 8 (eight) hours as needed for spasms. 12/15/18  06/07/19  Petrucelli, Samantha R, PA-C    Allergies    Lisinopril and Sulfa antibiotics  Review of Systems   Review of Systems  Constitutional: Positive for chills and fever.  HENT: Positive for sore throat.   Eyes: Negative for visual disturbance.  Respiratory: Positive for cough and shortness of breath. Negative for hemoptysis and sputum production.   Cardiovascular: Negative for chest pain.  Gastrointestinal: Positive for vomiting. Negative for abdominal pain.  Genitourinary: Negative for dysuria.  Musculoskeletal: Negative for neck pain.  Skin: Negative for rash.  Neurological: Negative for headaches.    Physical Exam Updated Vital Signs BP (!) 144/92 (BP Location: Right Arm)   Pulse (!) 128   Temp 99.5 F (37.5 C) (Oral)   Resp 18   SpO2 99%   Physical Exam Vitals and nursing note reviewed.  Constitutional:      Appearance: He is well-developed.  HENT:     Head: Normocephalic and atraumatic.  Eyes:     Conjunctiva/sclera: Conjunctivae normal.  Cardiovascular:     Rate and Rhythm: Regular rhythm. Tachycardia present.     Heart sounds: No murmur heard.   Pulmonary:     Effort: Pulmonary effort is normal. No respiratory distress.     Breath sounds: Normal breath sounds.  Abdominal:     Palpations: Abdomen is soft.     Tenderness: There is no abdominal tenderness.  Musculoskeletal:        General: Normal range of motion.     Cervical back: Neck supple.     Right lower leg: No tenderness. No edema.     Left lower leg: No tenderness. No edema.  Skin:    General: Skin is warm and dry.     Capillary Refill: Capillary refill takes less than 2 seconds.  Neurological:     General: No focal deficit present.     Mental Status: He is alert.     ED Results / Procedures / Treatments   Labs (all labs ordered are listed, but only abnormal results are displayed) Labs Reviewed  RESP PANEL BY RT-PCR (FLU A&B, COVID) ARPGX2 - Abnormal; Notable for the following  components:      Result Value   Influenza A by PCR POSITIVE (*)    All other components within normal limits  BASIC METABOLIC PANEL - Abnormal; Notable for the following components:   Calcium 8.8 (*)    All other components within normal limits  CBC WITH DIFFERENTIAL/PLATELET    EKG None  Radiology DG Chest 2 View  Result Date: 05/23/2020 CLINICAL DATA:  Cough, fever, shortness of breath. EXAM: CHEST - 2 VIEW COMPARISON:  May 22, 2020. FINDINGS: The heart size and mediastinal contours are within normal limits. Both lungs are clear. No pneumothorax or pleural effusion is noted.  The visualized skeletal structures are unremarkable. IMPRESSION: No active cardiopulmonary disease. Electronically Signed   By: Lupita Raider M.D.   On: 05/23/2020 16:35    Procedures Procedures   Medications Ordered in ED Medications  sodium chloride 0.9 % bolus 1,000 mL (0 mLs Intravenous Stopped 05/23/20 1842)  benzonatate (TESSALON) capsule 200 mg (200 mg Oral Given 05/23/20 1845)    ED Course  I have reviewed the triage vital signs and the nursing notes.  Pertinent labs & imaging results that were available during my care of the patient were reviewed by me and considered in my medical decision making (see chart for details).  Clinical Course as of 05/24/20 0929  Mon May 23, 2020  1732 EKG not crossing in epic.  Sinus tachycardia rate of 125, normal intervals, no acute ST-T's. [MB]  1856 Reviewed work-up with patient.  His heart rate is improved somewhat.  Oxygen remained stable. [MB]    Clinical Course User Index [MB] Terrilee Files, MD   MDM Rules/Calculators/A&P                         DEBRA CALABRETTA was evaluated in Emergency Department on 05/23/2020 for the symptoms described in the history of present illness. He was evaluated in the context of the global COVID-19 pandemic, which necessitated consideration that the patient might be at risk for infection with the SARS-CoV-2 virus that  causes COVID-19. Institutional protocols and algorithms that pertain to the evaluation of patients at risk for COVID-19 are in a state of rapid change based on information released by regulatory bodies including the CDC and federal and state organizations. These policies and algorithms were followed during the patient's care in the ED.  This patient complains of cough, shortness of breath, fatigue; this involves an extensive number of treatment Options and is a complaint that carries with it a high risk of complications and Morbidity. The differential includes COVID, flu, viral syndrome, pneumonia  I ordered, reviewed and interpreted labs, which included CBC with normal white count hemoglobin, chemistries normal, COVID testing negative, flu a positive I ordered medication IV fluids, Tessalon Perle I ordered imaging studies which included chest x-ray and I independently    visualized and interpreted imaging which showed no acute infiltrates Previous records obtained and reviewed in epic, no recent admissions  After the interventions stated above, I reevaluated the patient and found patient to remain tachycardic although well-appearing.  Reviewed results of work-up with him.  Will cover with Tamiflu although doubt any significant improvement due to advanced age of illness.  No indications for admission.  Return instructions discussed   Final Clinical Impression(s) / ED Diagnoses Final diagnoses:  Influenza    Rx / DC Orders ED Discharge Orders         Ordered    oseltamivir (TAMIFLU) 75 MG capsule  Every 12 hours        05/23/20 1857    benzonatate (TESSALON) 100 MG capsule  Every 8 hours        05/23/20 1857           Terrilee Files, MD 05/24/20 [phone]

## 2020-05-23 NOTE — ED Triage Notes (Signed)
C/o cough, fever, shortness of breath x 3-4 days

## 2020-08-11 ENCOUNTER — Other Ambulatory Visit: Payer: Self-pay

## 2020-08-12 ENCOUNTER — Encounter: Payer: Self-pay | Admitting: Urology

## 2020-08-15 ENCOUNTER — Encounter (INDEPENDENT_AMBULATORY_CARE_PROVIDER_SITE_OTHER): Payer: Self-pay | Admitting: Gastroenterology

## 2020-08-15 ENCOUNTER — Ambulatory Visit (INDEPENDENT_AMBULATORY_CARE_PROVIDER_SITE_OTHER): Payer: Managed Care, Other (non HMO) | Admitting: Gastroenterology

## 2020-08-15 ENCOUNTER — Other Ambulatory Visit (INDEPENDENT_AMBULATORY_CARE_PROVIDER_SITE_OTHER): Payer: Self-pay

## 2020-08-15 ENCOUNTER — Encounter (INDEPENDENT_AMBULATORY_CARE_PROVIDER_SITE_OTHER): Payer: Self-pay

## 2020-08-15 ENCOUNTER — Other Ambulatory Visit: Payer: Self-pay

## 2020-08-15 DIAGNOSIS — G8929 Other chronic pain: Secondary | ICD-10-CM | POA: Diagnosis not present

## 2020-08-15 DIAGNOSIS — R1013 Epigastric pain: Secondary | ICD-10-CM | POA: Diagnosis not present

## 2020-08-15 DIAGNOSIS — K9089 Other intestinal malabsorption: Secondary | ICD-10-CM | POA: Diagnosis not present

## 2020-08-15 DIAGNOSIS — R768 Other specified abnormal immunological findings in serum: Secondary | ICD-10-CM | POA: Diagnosis not present

## 2020-08-15 MED ORDER — HYOSCYAMINE SULFATE 0.125 MG SL SUBL: 0.1250 mg | SUBLINGUAL_TABLET | Freq: Four times a day (QID) | SUBLINGUAL | 3 refills | Status: DC | PRN

## 2020-08-15 MED ORDER — COLESTIPOL HCL 1 G PO TABS: 2.0000 g | ORAL_TABLET | Freq: Every day | ORAL | 3 refills | Status: DC

## 2020-08-15 NOTE — Progress Notes (Signed)
Katrinka Blazing, M.D. Gastroenterology & Hepatology [facility] [address] [place], [place] [address] Primary Care Physician: [facility] [address] [place] [address]  Referring MD: PCP  Chief Complaint: Abdominal pain  History of Present Illness: PHI AVANS is a 25 y.o. male with PMH HTN, who presents for evaluation of epigastric abdominal pain.  Patient reports that in 2019 he had a cholecystectomy for GB polyps, he had this performed at [facility]. 6 months after this surgery he presented presented intermittent episodes of epigastric abdominal pain, which does not radiate anywhere else. He describes it as a dull aching pain. When it hurts, its very tender to the touch.  He denies having any nighttime symptoms.  The patient reports that the pain usually lasts 4-5 days when present and can be free of pain for 2-3 weeks . He has presented some episode sof nausea but no vomiting when he is having the pain.  He denies taking any medication to relieve his pain which resolves on its own. The patient denies having any fever, chills, hematochezia, melena, hematemesis, abdominal distention, diarrhea, jaundice, pruritus or weight loss.  The patient also reports that after he had his cholecystectomy he has presented 8-10 BM s per day. Stool is watery, sometimes normal in consistency. Denies taking any antidiarrheals. No melena or hematochezia. He used to have regular Bms in the past.  He does take Protonix for GERD but does not think it helps for his abdominal pain..  Patient reports that he has tested positive for H. Pylori x2 in the past (serology). States he had some treatment in the past but does not know when he had it done.  Upon review of the patient's medical record, he has had several visits to the ER for evaluation of these episodes.  His most recent visits in the ER his CT of the abdomen and pelvis  has been unremarkable but he had 1 visit on 04/12/2018 where a CT of the abdomen and pelvis with IV contrast was performed where he was found to have a high-grade small bowel obstruction.  He had CT of the abdomen pelvis with IV contrast on 07/21/2019, 10/21/2018 and on 01/26/2020 which did not show any presence of any acute intra-abdominal pathology or obstruction.  Last QBH:ALPFX Last Colonoscopy:never  FHx: neg for any gastrointestinal/liver disease, colon cancer uncle Social: neg smoking, or illicit drug use. Quit drinking alcohol  Surgical: cholecystectomy, hernia repair and appendectomy  Past Medical History: Past Medical History:  Diagnosis Date   ADHD (attention deficit hyperactivity disorder)    Asthma    Gastroenteritis    Hypertension    Left shoulder pain    Meningitis    Skull fracture (HCC)    Subarachnoid hemorrhage (HCC)     Past Surgical History: Past Surgical History:  Procedure Laterality Date   APPENDECTOMY     CHOLECYSTECTOMY     HERNIA REPAIR      Family History: Family History  Problem Relation Age of Onset   Diabetes Mother    COPD Mother    Diabetes Father    Hypertension Father    Heart disease Father     Social History: Social History   Tobacco Use  Smoking Status Never  Smokeless Tobacco Never   Social History   Substance and Sexual Activity  Alcohol Use Not Currently   Social History   Substance and Sexual Activity  Drug Use No  Allergies: Allergies  Allergen Reactions   Lisinopril Other (See Comments)    Unknown reaction   Sulfa Antibiotics Hives    Medications: Current Outpatient Medications  Medication Sig Dispense Refill   albuterol (VENTOLIN HFA) 108 (90 Base) MCG/ACT inhaler Inhale 1 puff into the lungs every 4 (four) hours as needed for wheezing or shortness of breath (cough). 8 g 2   metoprolol succinate (TOPROL-XL) 50 MG 24 hr tablet Take 50 mg by mouth daily. Take with or immediately following a meal.      pantoprazole (PROTONIX) 40 MG tablet Take 1 tablet (40 mg total) by mouth daily. (Patient taking differently: Take 40 mg by mouth 2 (two) times daily.) 30 tablet 0   sildenafil (VIAGRA) 100 MG tablet 1 tablet by mouth 1 hour prior to intercourse (Patient taking differently: Take 100 mg by mouth daily as needed for erectile dysfunction (1 hour prior to intercourse).) 6 tablet 0   cholecalciferol (VITAMIN D3) 25 MCG (1000 UNIT) tablet Take 1,000 Units by mouth daily. (Patient not taking: Reported on 08/15/2020)     sucralfate (CARAFATE) 1 GM/10ML suspension Take 10 mLs (1 g total) by mouth 4 (four) times daily -  with meals and at bedtime. (Patient not taking: Reported on 08/15/2020) 420 mL 0   No current facility-administered medications for this visit.    Review of Systems: GENERAL: negative for malaise, night sweats HEENT: No changes in hearing or vision, no nose bleeds or other nasal problems. NECK: Negative for lumps, goiter, pain and significant neck swelling RESPIRATORY: Negative for cough, wheezing CARDIOVASCULAR: Negative for chest pain, leg swelling, palpitations, orthopnea GI: SEE HPI MUSCULOSKELETAL: Negative for joint pain or swelling, back pain, and muscle pain. SKIN: Negative for lesions, rash PSYCH: Negative for sleep disturbance, mood disorder and recent psychosocial stressors. HEMATOLOGY Negative for prolonged bleeding, bruising easily, and swollen nodes. ENDOCRINE: Negative for cold or heat intolerance, polyuria, polydipsia and goiter. NEURO: negative for tremor, gait imbalance, syncope and seizures. The remainder of the review of systems is noncontributory.   Physical Exam: BP (!) 132/92 (BP Location: Right Arm, Patient Position: Sitting, Cuff Size: Large)   Pulse (!) 111   Temp 98.6 F (37 C) (Oral)   Ht 6' (1.829 m)   Wt 288 lb (130.6 kg)   BMI 39.06 kg/m  GENERAL: The patient is AO x3, in no acute distress. Obese. HEENT: Head is normocephalic and atraumatic. EOMI  are intact. Mouth is well hydrated and without lesions. NECK: Supple. No masses LUNGS: Clear to auscultation. No presence of rhonchi/wheezing/rales. Adequate chest expansion HEART: RRR, normal s1 and s2. ABDOMEN: Soft, nontender, no guarding, no peritoneal signs, and nondistended. BS +. No masses. EXTREMITIES: Without any cyanosis, clubbing, rash, lesions or edema. NEUROLOGIC: AOx3, no focal motor deficit. SKIN: no jaundice, no rashes   Imaging/Labs: as above  I personally reviewed and interpreted the available labs, imaging and endoscopic files.  Impression and Plan: WILLEM KLINGENSMITH is a 25 y.o. male with PMH HTN, who presents for evaluation of epigastric abdominal pain.  The patient has presented chronic recurrent episodes of abdominal pain in the epigastric area of unclear etiology.  He has had cross-sectional abdominal imaging that showed presence of partial small bowel obstruction was prepped with subsequent abdominal imaging studies have been unremarkable.  His initial episode of bowel obstruction possibly was related to adhesions but I do not think his current symptoms are related to a bowel obstruction as he is not having any other symptoms otherwise.  Explained to him that we will investigate this further with an EGD, will need to rule out organic causes such as peptic ulcer disease, but I also explained that it is possible his symptoms are related to functional etiology.  We will verify if he has had adequate eradication of the H. pylori with a breath test, he will need to have this test performed off PPI for 2 weeks which he understood.  I will start him on Levsin as needed for abdominal pain for now.  Finally, he has presented diarrhea after he had his cholecystectomy, there is very likely this is related to bile salt induced diarrhea for which he will benefit of starting on colestipol.  - Schedule EGD - Start Levsin 1 tablet q6h as needed for abdominal pain - Start colestipol once  every day - take 4 hours apart from your other medicines - Perform breath test off pantoprazole for 2 weeks  All questions were answered.      Katrinka Blazing, MD Gastroenterology and Hepatology [facility]

## 2020-08-15 NOTE — H&P (View-Only) (Signed)
Katrinka Blazing, M.D. Gastroenterology & Hepatology [facility] [address] [place], [place] [address] Primary Care Physician: [facility] [address] [place] [address]  Referring MD: PCP  Chief Complaint: Abdominal pain  History of Present Illness: PHI AVANS is a 25 y.o. male with PMH HTN, who presents for evaluation of epigastric abdominal pain.  Patient reports that in 2019 he had a cholecystectomy for GB polyps, he had this performed at [facility]. 6 months after this surgery he presented presented intermittent episodes of epigastric abdominal pain, which does not radiate anywhere else. He describes it as a dull aching pain. When it hurts, its very tender to the touch.  He denies having any nighttime symptoms.  The patient reports that the pain usually lasts 4-5 days when present and can be free of pain for 2-3 weeks . He has presented some episode sof nausea but no vomiting when he is having the pain.  He denies taking any medication to relieve his pain which resolves on its own. The patient denies having any fever, chills, hematochezia, melena, hematemesis, abdominal distention, diarrhea, jaundice, pruritus or weight loss.  The patient also reports that after he had his cholecystectomy he has presented 8-10 BM s per day. Stool is watery, sometimes normal in consistency. Denies taking any antidiarrheals. No melena or hematochezia. He used to have regular Bms in the past.  He does take Protonix for GERD but does not think it helps for his abdominal pain..  Patient reports that he has tested positive for H. Pylori x2 in the past (serology). States he had some treatment in the past but does not know when he had it done.  Upon review of the patient's medical record, he has had several visits to the ER for evaluation of these episodes.  His most recent visits in the ER his CT of the abdomen and pelvis  has been unremarkable but he had 1 visit on 04/12/2018 where a CT of the abdomen and pelvis with IV contrast was performed where he was found to have a high-grade small bowel obstruction.  He had CT of the abdomen pelvis with IV contrast on 07/21/2019, 10/21/2018 and on 01/26/2020 which did not show any presence of any acute intra-abdominal pathology or obstruction.  Last QBH:ALPFX Last Colonoscopy:never  FHx: neg for any gastrointestinal/liver disease, colon cancer uncle Social: neg smoking, or illicit drug use. Quit drinking alcohol  Surgical: cholecystectomy, hernia repair and appendectomy  Past Medical History: Past Medical History:  Diagnosis Date   ADHD (attention deficit hyperactivity disorder)    Asthma    Gastroenteritis    Hypertension    Left shoulder pain    Meningitis    Skull fracture (HCC)    Subarachnoid hemorrhage (HCC)     Past Surgical History: Past Surgical History:  Procedure Laterality Date   APPENDECTOMY     CHOLECYSTECTOMY     HERNIA REPAIR      Family History: Family History  Problem Relation Age of Onset   Diabetes Mother    COPD Mother    Diabetes Father    Hypertension Father    Heart disease Father     Social History: Social History   Tobacco Use  Smoking Status Never  Smokeless Tobacco Never   Social History   Substance and Sexual Activity  Alcohol Use Not Currently   Social History   Substance and Sexual Activity  Drug Use No  Allergies: Allergies  Allergen Reactions   Lisinopril Other (See Comments)    Unknown reaction   Sulfa Antibiotics Hives    Medications: Current Outpatient Medications  Medication Sig Dispense Refill   albuterol (VENTOLIN HFA) 108 (90 Base) MCG/ACT inhaler Inhale 1 puff into the lungs every 4 (four) hours as needed for wheezing or shortness of breath (cough). 8 g 2   metoprolol succinate (TOPROL-XL) 50 MG 24 hr tablet Take 50 mg by mouth daily. Take with or immediately following a meal.      pantoprazole (PROTONIX) 40 MG tablet Take 1 tablet (40 mg total) by mouth daily. (Patient taking differently: Take 40 mg by mouth 2 (two) times daily.) 30 tablet 0   sildenafil (VIAGRA) 100 MG tablet 1 tablet by mouth 1 hour prior to intercourse (Patient taking differently: Take 100 mg by mouth daily as needed for erectile dysfunction (1 hour prior to intercourse).) 6 tablet 0   cholecalciferol (VITAMIN D3) 25 MCG (1000 UNIT) tablet Take 1,000 Units by mouth daily. (Patient not taking: Reported on 08/15/2020)     sucralfate (CARAFATE) 1 GM/10ML suspension Take 10 mLs (1 g total) by mouth 4 (four) times daily -  with meals and at bedtime. (Patient not taking: Reported on 08/15/2020) 420 mL 0   No current facility-administered medications for this visit.    Review of Systems: GENERAL: negative for malaise, night sweats HEENT: No changes in hearing or vision, no nose bleeds or other nasal problems. NECK: Negative for lumps, goiter, pain and significant neck swelling RESPIRATORY: Negative for cough, wheezing CARDIOVASCULAR: Negative for chest pain, leg swelling, palpitations, orthopnea GI: SEE HPI MUSCULOSKELETAL: Negative for joint pain or swelling, back pain, and muscle pain. SKIN: Negative for lesions, rash PSYCH: Negative for sleep disturbance, mood disorder and recent psychosocial stressors. HEMATOLOGY Negative for prolonged bleeding, bruising easily, and swollen nodes. ENDOCRINE: Negative for cold or heat intolerance, polyuria, polydipsia and goiter. NEURO: negative for tremor, gait imbalance, syncope and seizures. The remainder of the review of systems is noncontributory.   Physical Exam: BP (!) 132/92 (BP Location: Right Arm, Patient Position: Sitting, Cuff Size: Large)   Pulse (!) 111   Temp 98.6 F (37 C) (Oral)   Ht 6' (1.829 m)   Wt 288 lb (130.6 kg)   BMI 39.06 kg/m  GENERAL: The patient is AO x3, in no acute distress. Obese. HEENT: Head is normocephalic and atraumatic. EOMI  are intact. Mouth is well hydrated and without lesions. NECK: Supple. No masses LUNGS: Clear to auscultation. No presence of rhonchi/wheezing/rales. Adequate chest expansion HEART: RRR, normal s1 and s2. ABDOMEN: Soft, nontender, no guarding, no peritoneal signs, and nondistended. BS +. No masses. EXTREMITIES: Without any cyanosis, clubbing, rash, lesions or edema. NEUROLOGIC: AOx3, no focal motor deficit. SKIN: no jaundice, no rashes   Imaging/Labs: as above  I personally reviewed and interpreted the available labs, imaging and endoscopic files.  Impression and Plan: WILLEM KLINGENSMITH is a 25 y.o. male with PMH HTN, who presents for evaluation of epigastric abdominal pain.  The patient has presented chronic recurrent episodes of abdominal pain in the epigastric area of unclear etiology.  He has had cross-sectional abdominal imaging that showed presence of partial small bowel obstruction was prepped with subsequent abdominal imaging studies have been unremarkable.  His initial episode of bowel obstruction possibly was related to adhesions but I do not think his current symptoms are related to a bowel obstruction as he is not having any other symptoms otherwise.  Explained to him that we will investigate this further with an EGD, will need to rule out organic causes such as peptic ulcer disease, but I also explained that it is possible his symptoms are related to functional etiology.  We will verify if he has had adequate eradication of the H. pylori with a breath test, he will need to have this test performed off PPI for 2 weeks which he understood.  I will start him on Levsin as needed for abdominal pain for now.  Finally, he has presented diarrhea after he had his cholecystectomy, there is very likely this is related to bile salt induced diarrhea for which he will benefit of starting on colestipol.  - Schedule EGD - Start Levsin 1 tablet q6h as needed for abdominal pain - Start colestipol once  every day - take 4 hours apart from your other medicines - Perform breath test off pantoprazole for 2 weeks  All questions were answered.      Katrinka Blazing, MD Gastroenterology and Hepatology [facility]

## 2020-08-15 NOTE — Patient Instructions (Addendum)
Schedule EGD Start Levsin 1 tablet q6h as needed for abdominal pain Start colestipol once every day - take 4 hours apart from your other medicines Perform breath test off pantoprazole for 2 weeks

## 2020-08-16 ENCOUNTER — Ambulatory Visit (HOSPITAL_COMMUNITY)
Admission: RE | Admit: 2020-08-16 | Discharge: 2020-08-16 | Disposition: A | Discharge status: Home / Self Care | Payer: Managed Care, Other (non HMO) | Attending: Gastroenterology | Admitting: Gastroenterology

## 2020-08-16 ENCOUNTER — Ambulatory Visit (HOSPITAL_COMMUNITY): Payer: Managed Care, Other (non HMO) | Admitting: Anesthesiology

## 2020-08-16 ENCOUNTER — Encounter (HOSPITAL_COMMUNITY)
Admission: RE | Disposition: A | Discharge status: Home / Self Care | Payer: Self-pay | Source: Home / Self Care | Attending: Gastroenterology

## 2020-08-16 ENCOUNTER — Encounter (HOSPITAL_COMMUNITY): Payer: Self-pay | Admitting: Gastroenterology

## 2020-08-16 ENCOUNTER — Other Ambulatory Visit: Payer: Self-pay

## 2020-08-16 DIAGNOSIS — Z888 Allergy status to other drugs, medicaments and biological substances status: Secondary | ICD-10-CM | POA: Diagnosis not present

## 2020-08-16 DIAGNOSIS — Z9049 Acquired absence of other specified parts of digestive tract: Secondary | ICD-10-CM | POA: Insufficient documentation

## 2020-08-16 DIAGNOSIS — R197 Diarrhea, unspecified: Secondary | ICD-10-CM | POA: Insufficient documentation

## 2020-08-16 DIAGNOSIS — I10 Essential (primary) hypertension: Secondary | ICD-10-CM | POA: Insufficient documentation

## 2020-08-16 DIAGNOSIS — K2950 Unspecified chronic gastritis without bleeding: Secondary | ICD-10-CM | POA: Diagnosis not present

## 2020-08-16 DIAGNOSIS — Z79899 Other long term (current) drug therapy: Secondary | ICD-10-CM | POA: Diagnosis not present

## 2020-08-16 DIAGNOSIS — Z882 Allergy status to sulfonamides status: Secondary | ICD-10-CM | POA: Diagnosis not present

## 2020-08-16 DIAGNOSIS — Z800 Family history of malignant neoplasm of digestive organs: Secondary | ICD-10-CM | POA: Insufficient documentation

## 2020-08-16 DIAGNOSIS — Z8619 Personal history of other infectious and parasitic diseases: Secondary | ICD-10-CM | POA: Diagnosis not present

## 2020-08-16 DIAGNOSIS — R1013 Epigastric pain: Secondary | ICD-10-CM

## 2020-08-16 DIAGNOSIS — K219 Gastro-esophageal reflux disease without esophagitis: Secondary | ICD-10-CM | POA: Insufficient documentation

## 2020-08-16 HISTORY — PX: ESOPHAGOGASTRODUODENOSCOPY (EGD) WITH PROPOFOL: SHX5813

## 2020-08-16 HISTORY — PX: BIOPSY: SHX5522

## 2020-08-16 SURGERY — ESOPHAGOGASTRODUODENOSCOPY (EGD) WITH PROPOFOL
Anesthesia: General

## 2020-08-16 MED ORDER — LACTATED RINGERS IV SOLN: INTRAVENOUS | Status: DC | PRN

## 2020-08-16 MED ORDER — STERILE WATER FOR IRRIGATION IR SOLN
Status: DC | PRN
  Administered 2020-08-16: 100 mL

## 2020-08-16 MED ORDER — PROPOFOL 10 MG/ML IV BOLUS
INTRAVENOUS | Status: DC | PRN
  Administered 2020-08-16: 130 mg via INTRAVENOUS
  Administered 2020-08-16: 40 mg via INTRAVENOUS
  Administered 2020-08-16: 30 mg via INTRAVENOUS
  Administered 2020-08-16 (×2): 40 mg via INTRAVENOUS

## 2020-08-16 MED ORDER — MIDAZOLAM HCL 2 MG/2ML IJ SOLN
INTRAMUSCULAR | Status: AC
  Filled 2020-08-16: qty 2

## 2020-08-16 MED ORDER — LIDOCAINE HCL (CARDIAC) PF 100 MG/5ML IV SOSY
PREFILLED_SYRINGE | INTRAVENOUS | Status: DC | PRN
  Administered 2020-08-16: 50 mg via INTRAVENOUS

## 2020-08-16 MED ORDER — MIDAZOLAM HCL 2 MG/2ML IJ SOLN
INTRAMUSCULAR | Status: DC | PRN
  Administered 2020-08-16: 2 mg via INTRAVENOUS

## 2020-08-16 NOTE — Transfer of Care (Signed)
Immediate Anesthesia Transfer of Care Note  Patient: Logan Dawson  Procedure(s) Performed: ESOPHAGOGASTRODUODENOSCOPY (EGD) WITH PROPOFOL BIOPSY  Patient Location: Short Stay  Anesthesia Type:General  Level of Consciousness: awake, alert  and oriented  Airway & Oxygen Therapy: Patient Spontanous Breathing  Post-op Assessment: Report given to RN and Post -op Vital signs reviewed and stable  Post vital signs: Reviewed and stable  Last Vitals:  Vitals Value Taken Time  BP 11/50   Temp    Pulse 109   Resp    SpO2 96%     Last Pain:  Vitals:   08/16/20 0911  TempSrc:   PainSc: 3       Patients Stated Pain Goal: 6 (08/16/20 0840)  Complications: No notable events documented.

## 2020-08-16 NOTE — Discharge Instructions (Addendum)
We are waiting for your pathology results.  You are being discharged to home.  Resume your previous diet.  Take Levsin as needed for abdominal pain

## 2020-08-16 NOTE — Op Note (Addendum)
[facility] Patient Name: Logan Dawson Procedure Date: 08/16/2020 9:02 AM MRN: [id number] Date of Birth: 1995-12-02 Attending MD: Katrinka Blazing ,  CSN: [id number] Age: 25 Admit Type: Outpatient Procedure:                Upper GI endoscopy Indications:              Epigastric abdominal pain Providers:                Katrinka Blazing, Crystal Page, Kristine L. Jessee Avers, Technician Referring MD:              Medicines:                Monitored Anesthesia Care Complications:            No immediate complications. Estimated Blood Loss:     Estimated blood loss: none. Procedure:                Pre-Anesthesia Assessment:                           - Prior to the procedure, a History and Physical                            was performed, and patient medications, allergies                            and sensitivities were reviewed. The patient's                            tolerance of previous anesthesia was reviewed.                           - The risks and benefits of the procedure and the                            sedation options and risks were discussed with the                            patient. All questions were answered and informed                            consent was obtained.                           - ASA Grade Assessment: II - A patient with mild                            systemic disease.                           After obtaining informed consent, the endoscope was                            passed under direct vision. Throughout the  procedure, the patient's blood pressure, pulse, and                            oxygen saturations were monitored continuously. The                            [id number]-H190 ([id number]) scope was introduced through the                            mouth, and advanced to the second part of duodenum.                            The upper GI endoscopy was accomplished without                             difficulty. The patient tolerated the procedure                            well. Scope In: 9:14:58 AM Scope Out: 9:21:10 AM Total Procedure Duration: 0 hours 6 minutes 12 seconds  Findings:      The examined esophagus was normal.      The entire examined stomach was normal. Biopsies were taken with a cold       forceps for Helicobacter pylori testing.      The examined duodenum was normal. Biopsies were taken with a cold       forceps for histology. Impression:               - Normal esophagus.                           - Normal stomach. Biopsied.                           - Normal examined duodenum. Biopsied. Moderate Sedation:      Per Anesthesia Care Recommendation:           - Discharge patient to home (ambulatory).                           - Resume previous diet.                           - Await pathology results.                           - Take Levsin as needed for abdominal pain. Procedure Code(s):        --- Professional ---                           [phone], Esophagogastroduodenoscopy, flexible,                            transoral; with biopsy, single or multiple Diagnosis Code(s):        --- Professional ---  R10.13, Epigastric pain CPT copyright 2019 American Medical Association. All rights reserved. The codes documented in this report are preliminary and upon coder review may  be revised to meet current compliance requirements. Katrinka Blazing, MD Katrinka Blazing,  08/16/2020 9:26:34 AM This report has been signed electronically. Number of Addenda: 0

## 2020-08-16 NOTE — Anesthesia Postprocedure Evaluation (Signed)
Anesthesia Post Note  Patient: Logan Dawson  Procedure(s) Performed: ESOPHAGOGASTRODUODENOSCOPY (EGD) WITH PROPOFOL BIOPSY  Patient location during evaluation: Phase II Anesthesia Type: General Level of consciousness: awake Pain management: pain level controlled Vital Signs Assessment: post-procedure vital signs reviewed and stable Respiratory status: spontaneous breathing and respiratory function stable Cardiovascular status: blood pressure returned to baseline and stable Postop Assessment: no headache and no apparent nausea or vomiting Anesthetic complications: no Comments: Late entry   No notable events documented.   Last Vitals:  Vitals:   08/16/20 0840 08/16/20 0932  BP: (!) 143/88 (!) 111/50  Pulse: 100 (!) 105  Resp: (!) 23 20  Temp: 36.7 C (!) 36.4 C  SpO2:  96%    Last Pain:  Vitals:   08/16/20 0932  TempSrc: Axillary  PainSc: 0-No pain                 Windell Norfolk

## 2020-08-16 NOTE — Interval H&P Note (Signed)
History and Physical Interval Note:  08/16/2020 9:08 AM Logan Dawson is a 25 y.o. male with PMH HTN, who presents for evaluation of epigastric abdominal pain.  Patient states he is having epigastric pain today in the morning, 3 out of 10 intensity. Otherwise, denies having any nausea, vomiting, fever, chills, hematochezia, melena, hematemesis, abdominal distention,  diarrhea, jaundice, pruritus or weight loss.  BP (!) 143/88   Pulse 100   Temp 98.1 F (36.7 C) (Oral)   Resp (!) 23   Ht 6' (1.829 m)   Wt 130.6 kg   BMI 39.05 kg/m  GENERAL: The patient is AO x3, in no acute distress. HEENT: Head is normocephalic and atraumatic. EOMI are intact. Mouth is well hydrated and without lesions. NECK: Supple. No masses LUNGS: Clear to auscultation. No presence of rhonchi/wheezing/rales. Adequate chest expansion HEART: RRR, normal s1 and s2. ABDOMEN: mildly tender in epigastric area, no guarding, no peritoneal signs, and nondistended. BS +. No masses. EXTREMITIES: Without any cyanosis, clubbing, rash, lesions or edema. NEUROLOGIC: AOx3, no focal motor deficit. SKIN: no jaundice, no rashes   [name]  has presented today for surgery, with the diagnosis of Epigastric Pain.  The various methods of treatment have been discussed with the patient and family. After consideration of risks, benefits and other options for treatment, the patient has consented to  Procedure(s) with comments: ESOPHAGOGASTRODUODENOSCOPY (EGD) WITH PROPOFOL (N/A) - 10:10 - ok per Britta Mccreedy as a surgical intervention.  The patient's history has been reviewed, patient examined, no change in status, stable for surgery.  I have reviewed the patient's chart and labs.  Questions were answered to the patient's satisfaction.     Katrinka Blazing Mayorga

## 2020-08-16 NOTE — Anesthesia Preprocedure Evaluation (Signed)
Anesthesia Evaluation  Patient identified by MRN, date of birth, ID band Patient awake    Reviewed: Allergy & Precautions, H&P , NPO status , Patient's Chart, lab work & pertinent test results, reviewed documented beta blocker date and time   Airway Mallampati: II  TM Distance: >3 FB Neck ROM: full    Dental no notable dental hx.    Pulmonary asthma ,    Pulmonary exam normal breath sounds clear to auscultation       Cardiovascular Exercise Tolerance: Good hypertension, negative cardio ROS   Rhythm:regular Rate:Normal     Neuro/Psych PSYCHIATRIC DISORDERS negative neurological ROS     GI/Hepatic negative GI ROS, Neg liver ROS,   Endo/Other  Morbid obesity  Renal/GU negative Renal ROS  negative genitourinary   Musculoskeletal   Abdominal   Peds  Hematology negative hematology ROS (+)   Anesthesia Other Findings   Reproductive/Obstetrics negative OB ROS                             Anesthesia Physical Anesthesia Plan  ASA: 3  Anesthesia Plan: General   Post-op Pain Management:    Induction:   PONV Risk Score and Plan: Propofol infusion  Airway Management Planned:   Additional Equipment:   Intra-op Plan:   Post-operative Plan:   Informed Consent: I have reviewed the patients History and Physical, chart, labs and discussed the procedure including the risks, benefits and alternatives for the proposed anesthesia with the patient or authorized representative who has indicated his/her understanding and acceptance.     Dental Advisory Given  Plan Discussed with: CRNA  Anesthesia Plan Comments:         Anesthesia Quick Evaluation

## 2020-08-17 ENCOUNTER — Other Ambulatory Visit (INDEPENDENT_AMBULATORY_CARE_PROVIDER_SITE_OTHER): Payer: Self-pay | Admitting: Gastroenterology

## 2020-08-17 DIAGNOSIS — K2970 Gastritis, unspecified, without bleeding: Secondary | ICD-10-CM

## 2020-08-17 DIAGNOSIS — B9681 Helicobacter pylori [H. pylori] as the cause of diseases classified elsewhere: Secondary | ICD-10-CM

## 2020-08-17 LAB — SURGICAL PATHOLOGY

## 2020-08-17 MED ORDER — METRONIDAZOLE 500 MG PO TABS: 500.0000 mg | ORAL_TABLET | Freq: Three times a day (TID) | ORAL | 0 refills | Status: AC

## 2020-08-17 MED ORDER — BISMUTH 262 MG PO CHEW: 2.0000 | CHEWABLE_TABLET | Freq: Four times a day (QID) | ORAL | 0 refills | Status: DC

## 2020-08-17 MED ORDER — TETRACYCLINE HCL 500 MG PO CAPS: 500.0000 mg | ORAL_CAPSULE | Freq: Four times a day (QID) | ORAL | 0 refills | Status: DC

## 2020-08-18 ENCOUNTER — Other Ambulatory Visit (INDEPENDENT_AMBULATORY_CARE_PROVIDER_SITE_OTHER): Payer: Self-pay

## 2020-08-18 ENCOUNTER — Telehealth (INDEPENDENT_AMBULATORY_CARE_PROVIDER_SITE_OTHER): Payer: Self-pay

## 2020-08-18 DIAGNOSIS — B9681 Helicobacter pylori [H. pylori] as the cause of diseases classified elsewhere: Secondary | ICD-10-CM

## 2020-08-18 MED ORDER — TETRACYCLINE HCL 500 MG PO CAPS: 500.0000 mg | ORAL_CAPSULE | Freq: Four times a day (QID) | ORAL | 0 refills | Status: AC

## 2020-08-18 NOTE — Telephone Encounter (Signed)
Medication submitted to [facility]. Patient aware to take Flagyl, pepto and tetracycline and ppi together.

## 2020-08-18 NOTE — Telephone Encounter (Signed)
Unfortunately that medication is not part of the regimen, he has failed other treatment previously (unclear which ones), so this would be one of the most efficient against his infection. Can he try getting it thorugh GoodRx?

## 2020-08-18 NOTE — Telephone Encounter (Signed)
Per Mat at [facility]. Patient can not afford the Tetracycline it is over $200.00. They want to know if he can exchange it with Clarithromycin. Please advise.

## 2020-08-18 NOTE — Telephone Encounter (Signed)
I called and left a message on vm asked that patient to return call.  I spoke with [facility] they state they do not participate with Good Rx. Spoke with Dr. Levon Hedger about the cost at [facility] pharmacy is 103.76 and [facility] is 88.32. Ask the patient if he prefers one of these.

## 2020-08-18 NOTE — Telephone Encounter (Signed)
Spoke with the patient he wants his medication sent to [facility]. He will ask them to apply a good rx card. Mat aware at [facility].

## 2020-08-22 ENCOUNTER — Encounter (HOSPITAL_COMMUNITY): Payer: Self-pay | Admitting: Gastroenterology

## 2020-11-01 DIAGNOSIS — F445 Conversion disorder with seizures or convulsions: Secondary | ICD-10-CM | POA: Insufficient documentation

## 2020-12-01 ENCOUNTER — Encounter (INDEPENDENT_AMBULATORY_CARE_PROVIDER_SITE_OTHER): Payer: Self-pay | Admitting: Gastroenterology

## 2020-12-01 ENCOUNTER — Ambulatory Visit (INDEPENDENT_AMBULATORY_CARE_PROVIDER_SITE_OTHER): Payer: Self-pay | Admitting: Gastroenterology

## 2021-02-20 ENCOUNTER — Other Ambulatory Visit: Payer: Self-pay

## 2021-02-20 ENCOUNTER — Ambulatory Visit (INDEPENDENT_AMBULATORY_CARE_PROVIDER_SITE_OTHER): Payer: Self-pay | Admitting: Gastroenterology

## 2021-02-20 ENCOUNTER — Encounter (INDEPENDENT_AMBULATORY_CARE_PROVIDER_SITE_OTHER): Payer: Self-pay | Admitting: *Deleted

## 2021-02-20 ENCOUNTER — Encounter (INDEPENDENT_AMBULATORY_CARE_PROVIDER_SITE_OTHER): Payer: Self-pay | Admitting: Gastroenterology

## 2021-02-20 VITALS — BP 137/88 | HR 98 | Temp 97.9°F | Ht 72.0 in | Wt 300.6 lb

## 2021-02-20 DIAGNOSIS — G8929 Other chronic pain: Secondary | ICD-10-CM

## 2021-02-20 DIAGNOSIS — K921 Melena: Secondary | ICD-10-CM

## 2021-02-20 DIAGNOSIS — R1013 Epigastric pain: Secondary | ICD-10-CM

## 2021-02-20 DIAGNOSIS — K9089 Other intestinal malabsorption: Secondary | ICD-10-CM

## 2021-02-20 DIAGNOSIS — Z8619 Personal history of other infectious and parasitic diseases: Secondary | ICD-10-CM

## 2021-02-20 IMAGING — CT CT HEAD W/O CM
3 series · 16 of 47 positions shown, 19 images · non-contrast
Comparison: March 19, 2013

CLINICAL DATA: Status post fall.

EXAM:
CT HEAD WITHOUT CONTRAST
TECHNIQUE: Contiguous axial images were obtained from the base of the skull
through the vertex without intravenous contrast.

[Series 2: head w o · axial · 0.46mm/px · z∈[-22,+113]mm · 10 of 33 slices shown, 13 images]
[im 3/33  brain]
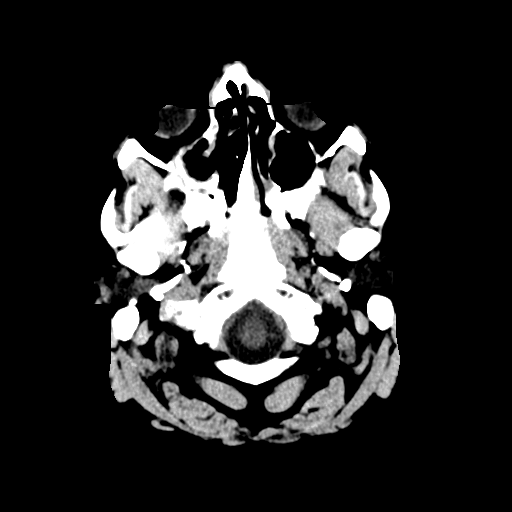
[im 3/33  bone]
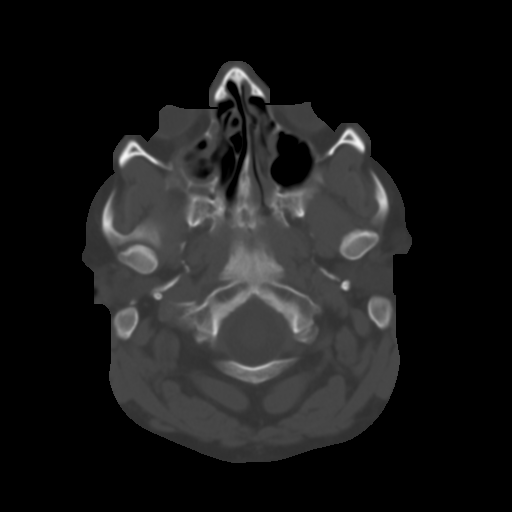
[im 6/33  brain]
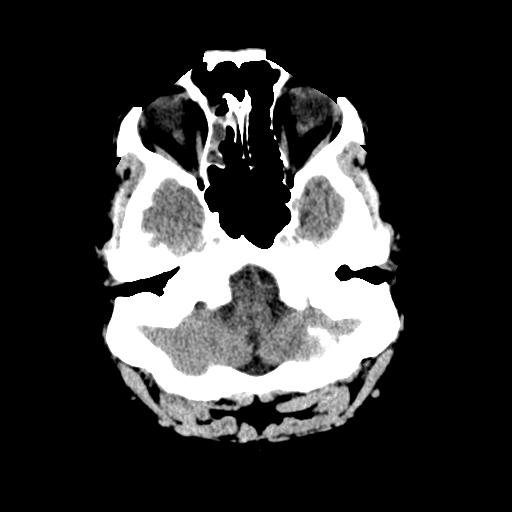
[im 9/33  brain]
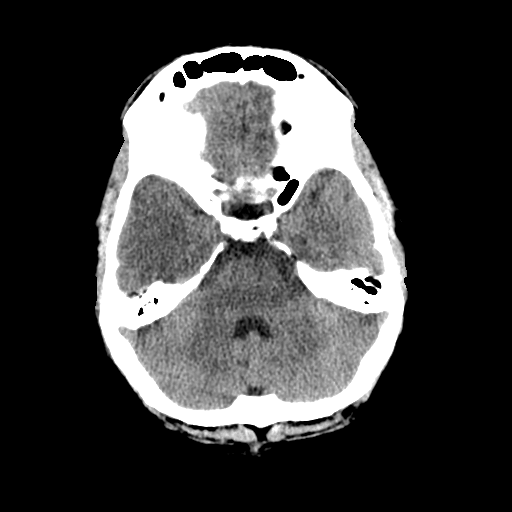
[im 12/33  brain]
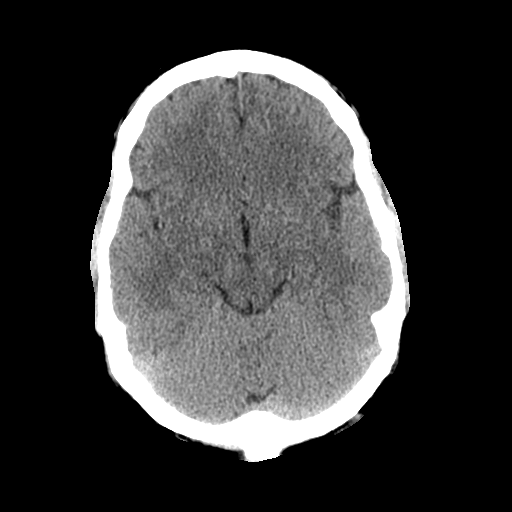
[im 15/33  brain]
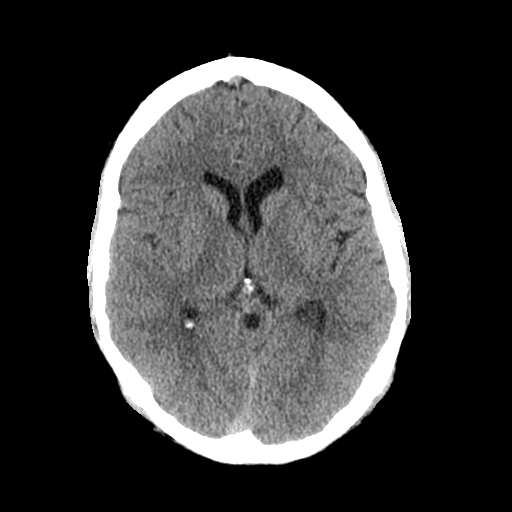
[im 15/33  bone]
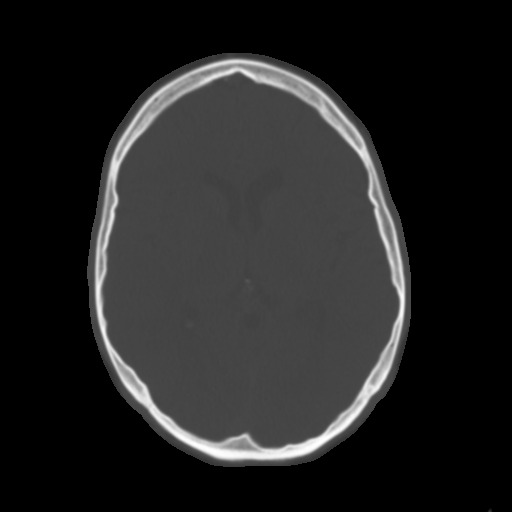
[im 18/33  brain]
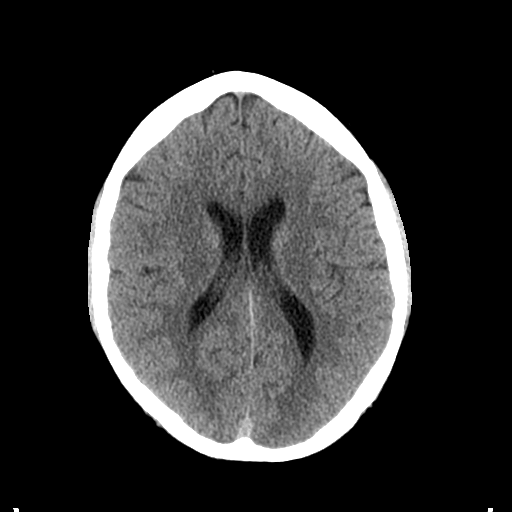
[im 21/33  brain]
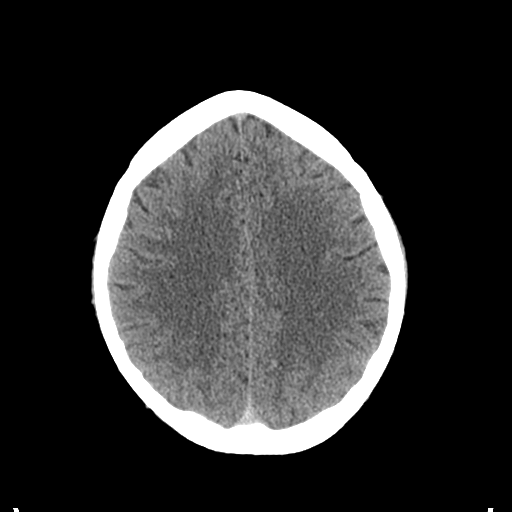
[im 25/33  brain]
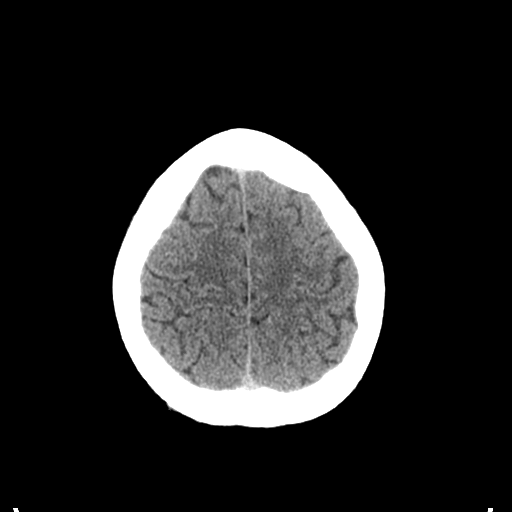
[im 27/33  brain]
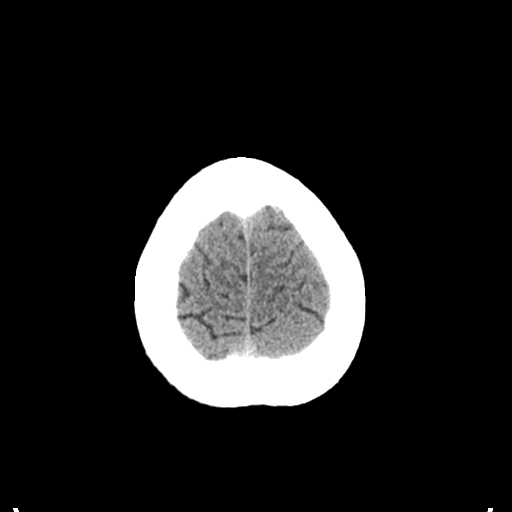
[im 27/33  bone]
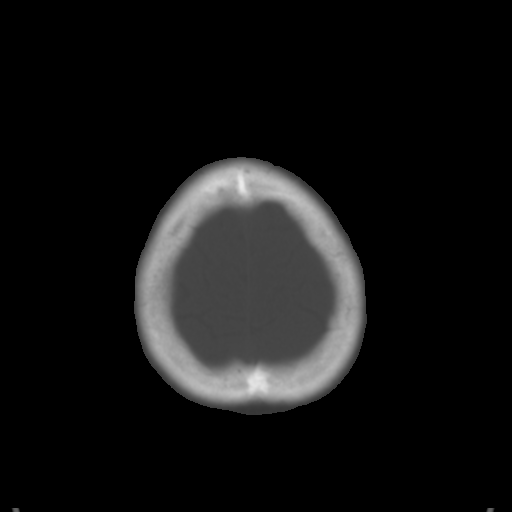
[im 30/33  brain]
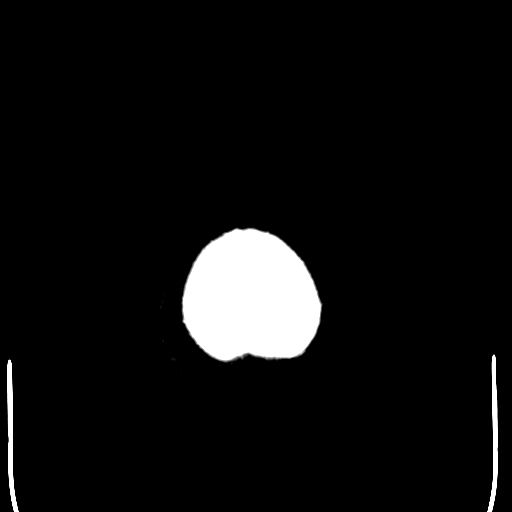

[Series 4: coronal soft · coronal · 0.33mm/px · 3 of 72 slices shown]
[im 24/72  brain]
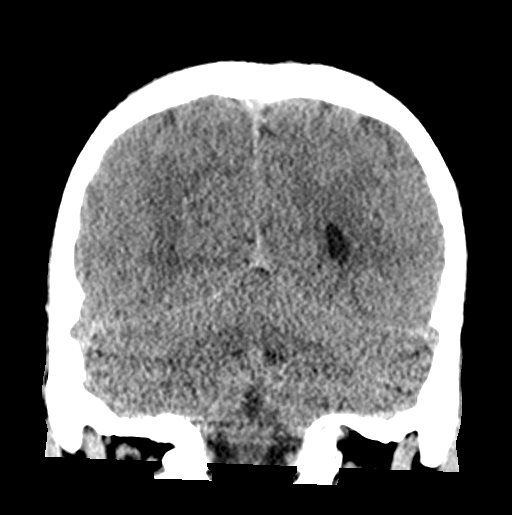
[im 32/72  brain]
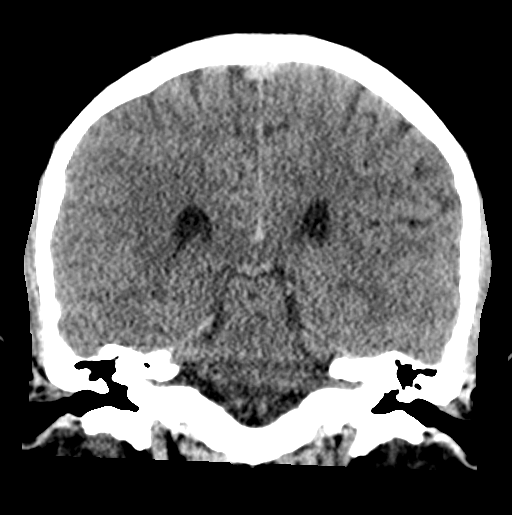
[im 40/72  brain]
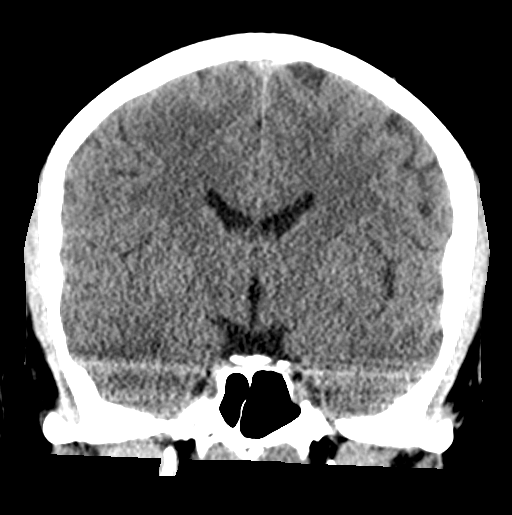

[Series 5: sagittal soft · sagittal · 0.36mm/px · 3 of 57 slices shown]
[im 19/57  brain]
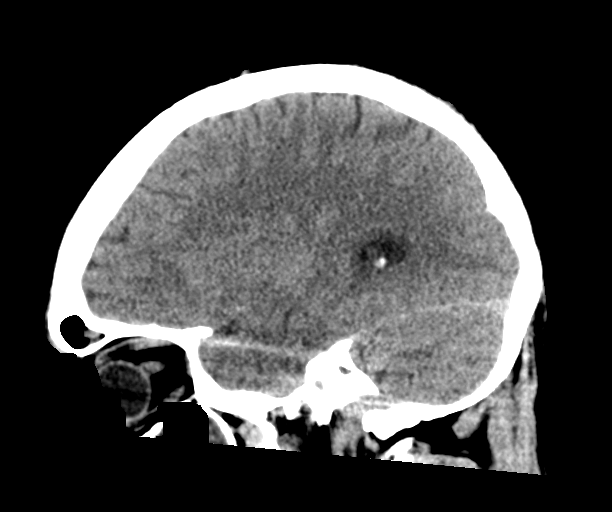
[im 29/57  brain]
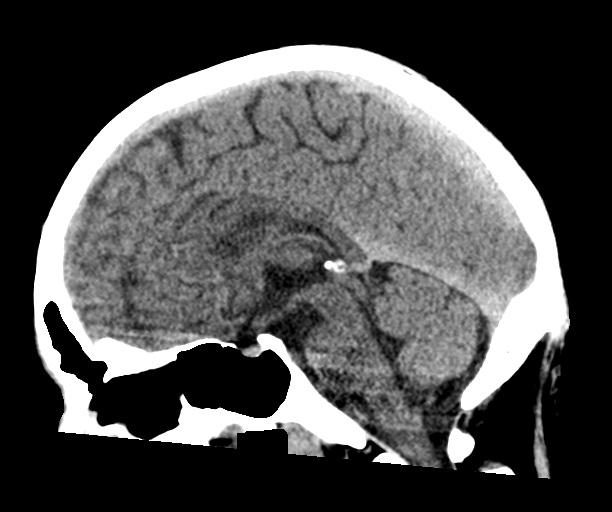
[im 38/57  brain]
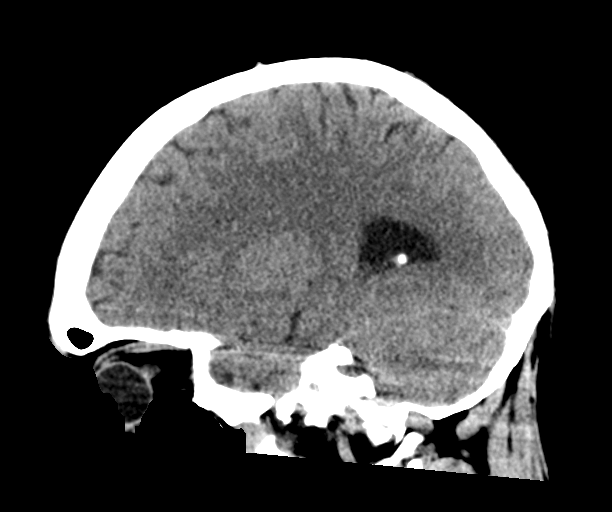

[16 of 47 positions shown; findings below may reference images not displayed]

FINDINGS: Brain: No evidence of acute infarction, hemorrhage, hydrocephalus,
extra-axial collection or mass lesion/mass effect.

Vascular: No hyperdense vessel or unexpected calcification.

Skull: Normal. Negative for fracture or focal lesion.

Sinuses/Orbits: There is marked severity right maxillary sinus and
right ethmoid sinus mucosal thickening. Mild right-sided frontal
sinus mucosal thickening is also noted.

Other: None.
IMPRESSION: 1. No acute intracranial abnormality.
2. Marked severity right maxillary sinus and right ethmoid sinus
mucosal thickening. Mild right-sided frontal sinus mucosal
thickening is also noted.

## 2021-02-20 IMAGING — DX DG CHEST 2V
2 series · 2 of 2 positions shown · non-contrast
Comparison: Chest radiograph dated 07/28/2019

CLINICAL DATA: Cough, syncope

EXAM:
CHEST - 2 VIEW

[chest pa]
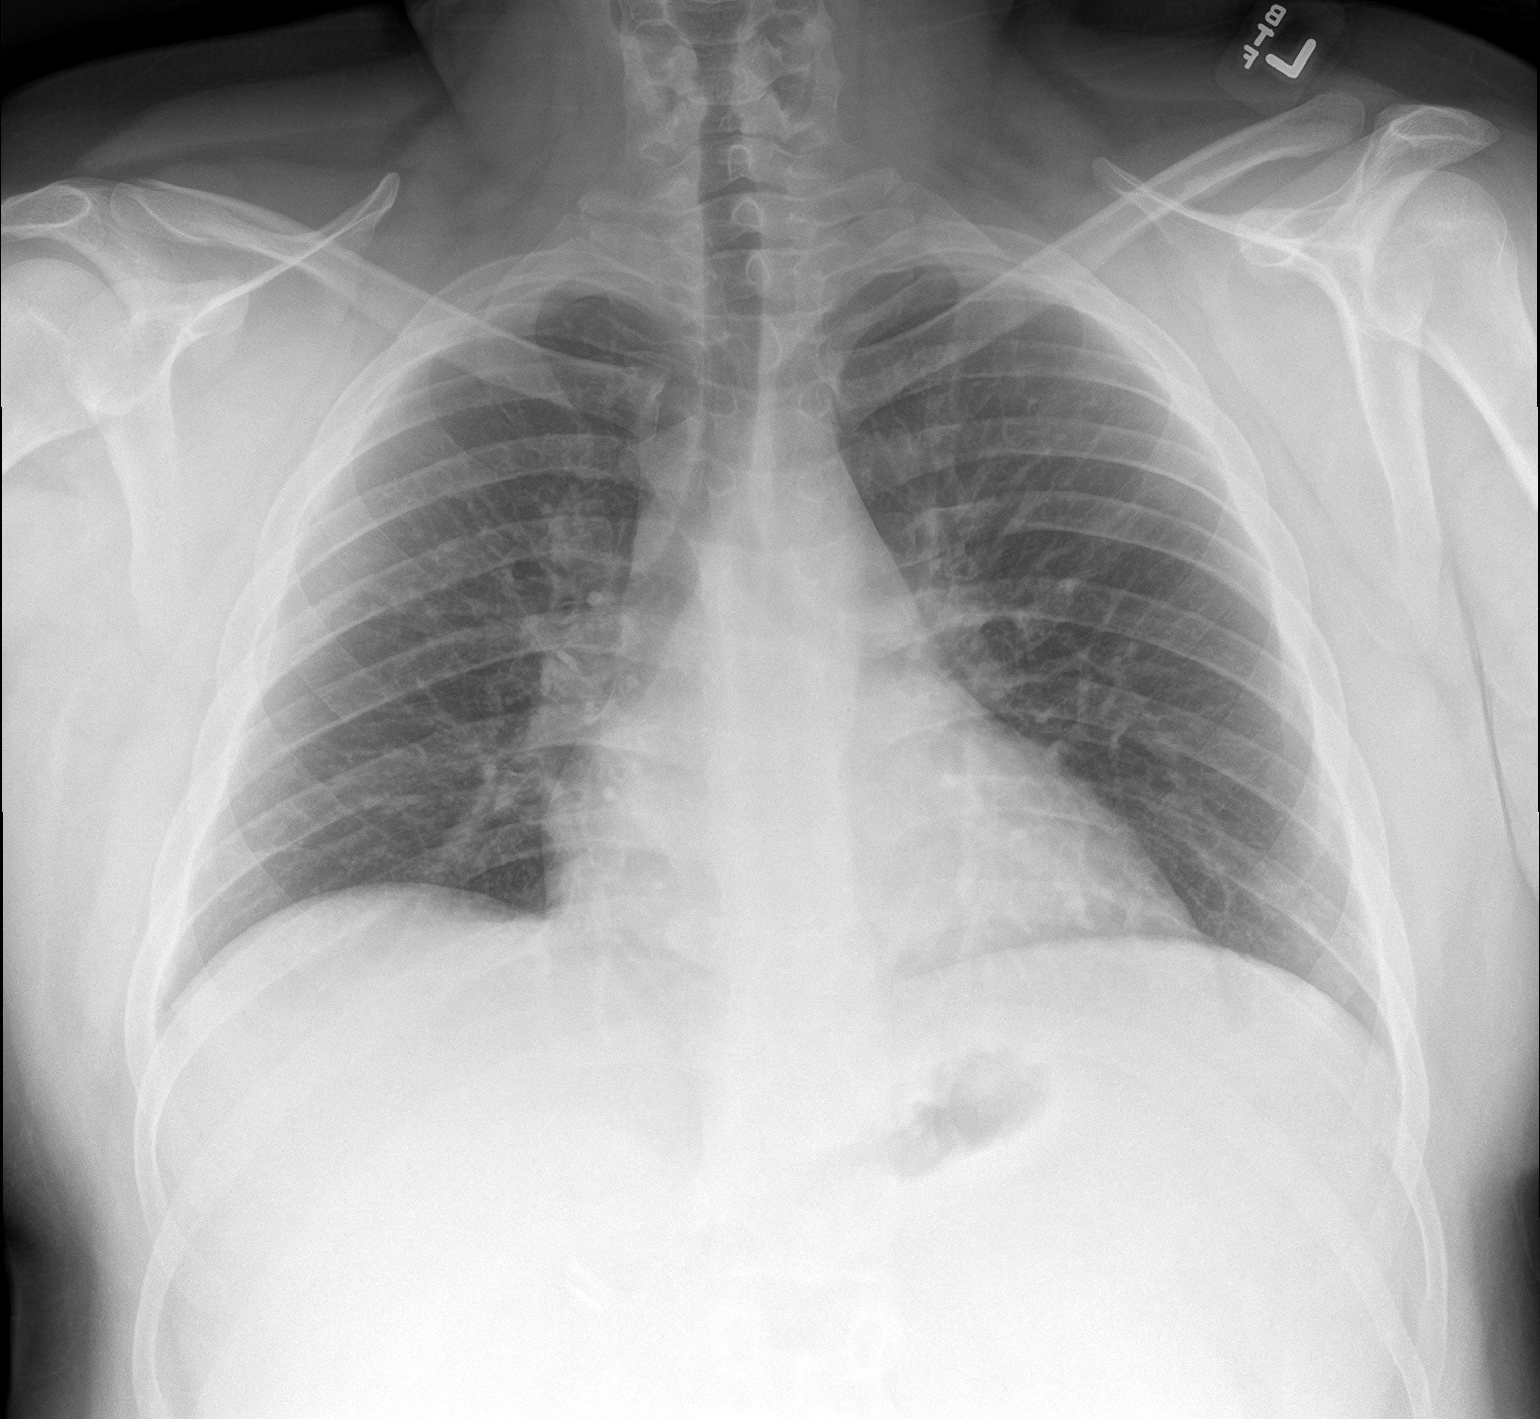

[chest lat]
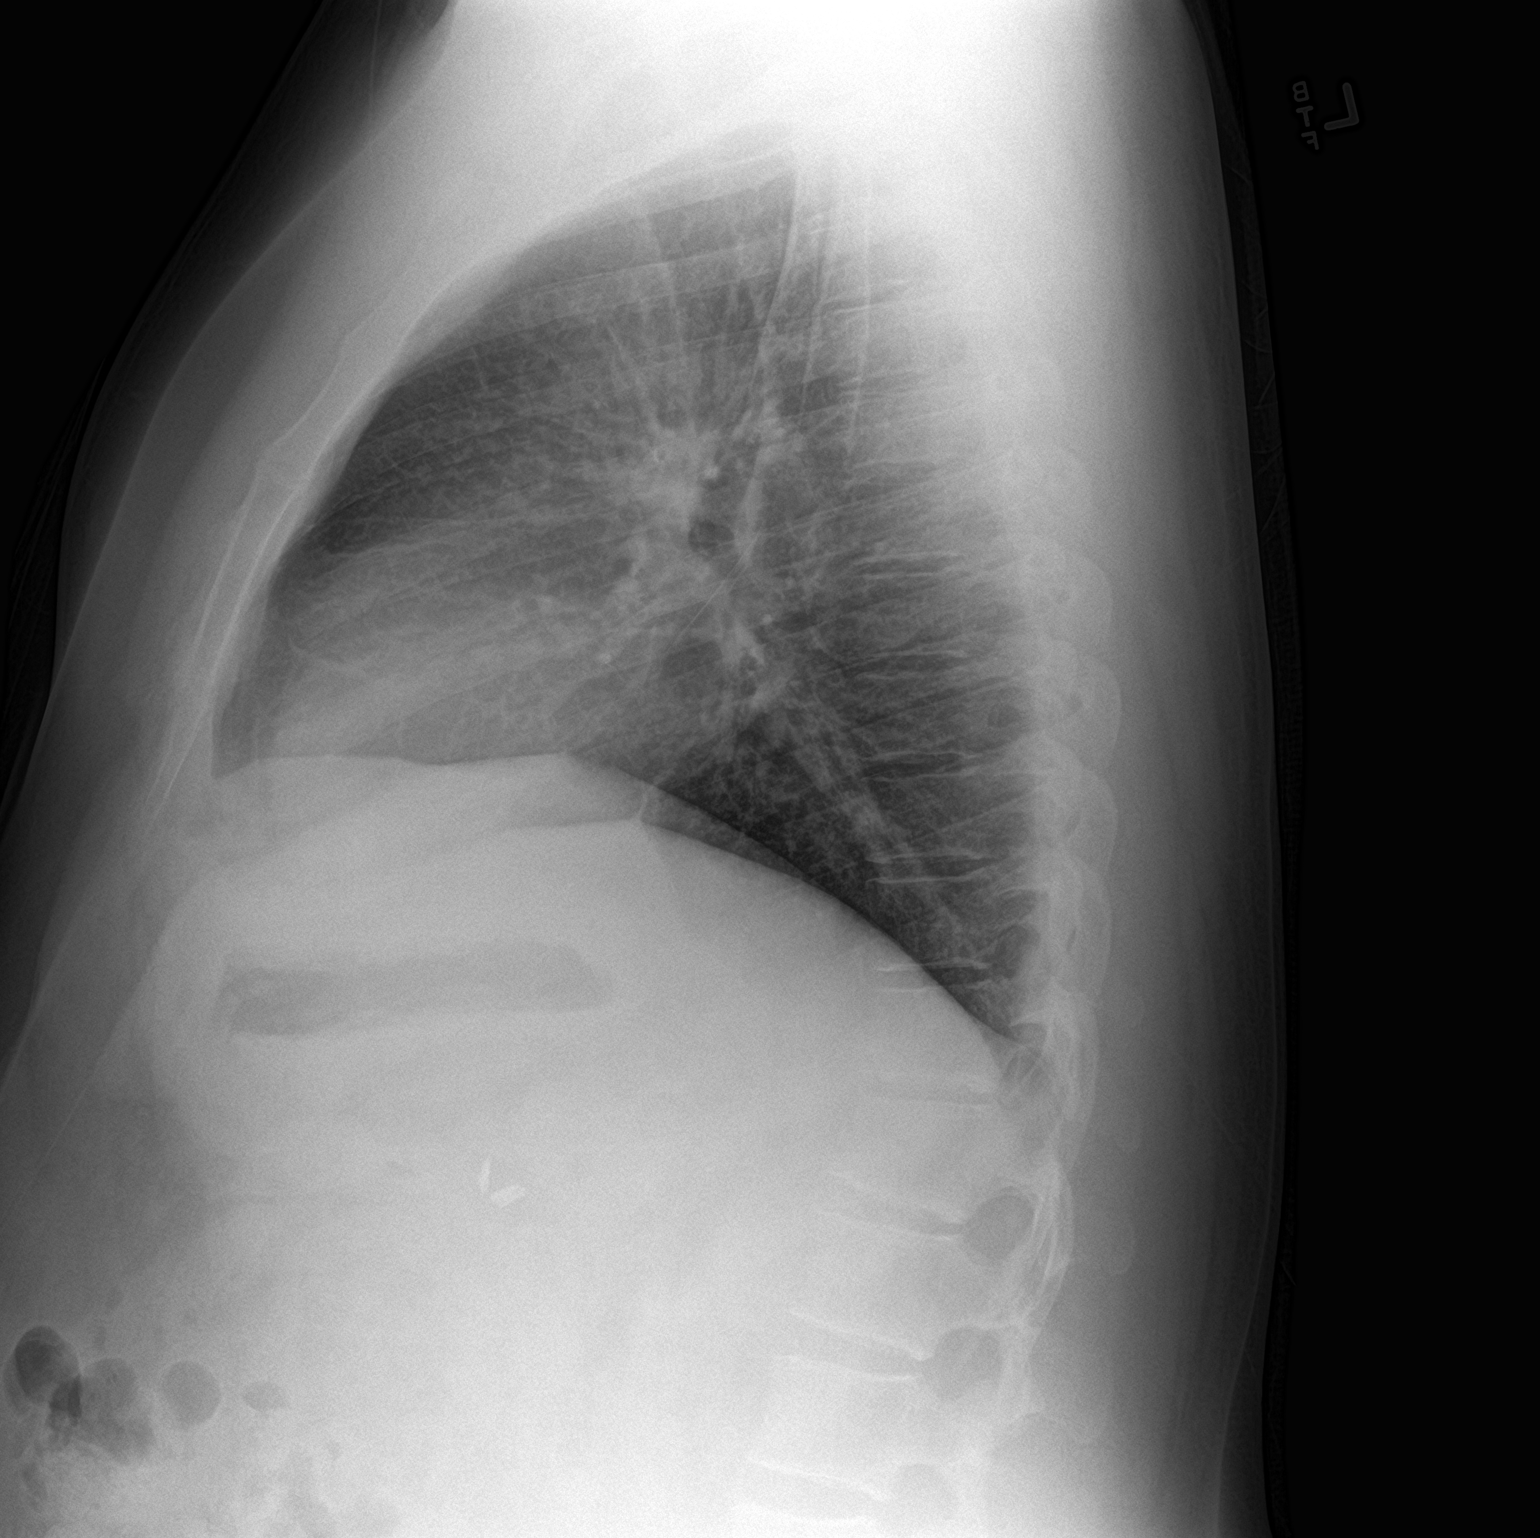

[2 of 2 positions shown; findings below may reference images not displayed]

FINDINGS: The heart size and mediastinal contours are within normal limits.
Both lungs are clear. The visualized skeletal structures are
unremarkable.
IMPRESSION: No active cardiopulmonary disease.

## 2021-02-20 MED ORDER — COLESTIPOL HCL 1 G PO TABS: 2.0000 g | ORAL_TABLET | Freq: Every day | ORAL | 3 refills | Status: DC

## 2021-02-20 MED ORDER — HYOSCYAMINE SULFATE 0.125 MG SL SUBL: 0.1250 mg | SUBLINGUAL_TABLET | Freq: Four times a day (QID) | SUBLINGUAL | 3 refills | Status: DC | PRN

## 2021-02-20 NOTE — Patient Instructions (Addendum)
We will proceed with H pylori breath test to confirm that infection has cleared up, if this is negative, we will discuss possibly doing capsule endoscopy for further evaluation of your upper abdominal pain and possibly colonoscopy.  As far as your diarrhea, I suspect this is related to bile acid diarrhea, I am sending colestipol 1g, please take two tablets daily. As we discussed, this is the starting dose, sometimes the dose needs to be increased. Avoiding fried, greasy, fatty foods with the absence of a gallbladder is also important.  I have also sent levsin for you to use for abdominal discomfort, this can also help to slow down your bowel movements as well. You can take this up to every 6 hours, some people find it works best taken before they eat.   I have provided a goodRx card as these are not covered by your insurance but should be no more than around $25 total for both meds with this card.   I will be in touch regarding H Pylori testing results.  Follow up to be determined

## 2021-02-20 NOTE — Progress Notes (Signed)
Referring Provider: [facility], [facility] [place] [facility] Primary Care Physician:  [facility], [facility] Primary GI Physician: Levon Hedgercastaneda  Chief Complaint  Patient presents with   Diarrhea    Having frequent stools, worse in the mornings and happens after eating. States abdominal pain is better since taking treatment for h pylori. Has some pain still but much better.    HPI:   Logan Dawson is a 26 y.o. male with past medical history of ADHD, asthma, HTN, meningitis, skull fracture, H pylori.  Patient presenting today for frequent BMs.   Patient last seen in July 2022 with epigastric pain and diarrhea that began after cholecystectomy. He was started on colestipol for suspected bile salt diarrhea and underwent EGD for further evaluation of epigastric pain. EGD 08/16/20 with biopsies positive for H pylori without dysplasia. Patient was started on quadruple therapy with PPI BID, patient was instructed to have urea breath test done after H pylori treatment to confirm eradication, however, this was not completed.   Today, he states that abdominal pain has improved since he did H pylori treatment. He could not complete the repeat breath test due to not having insurance. He does not think he ever took the colestipol for suspected bile acid diarrhea. He reports that he is having continue diarrhea, 7-8 episodes per day, usually he has diarrhea beginning in the morning and then feels that diarrhea typically occurs pretty soon after eating. It does not seem to depend on what time of food he is eating. He reports that these symptoms have been present since cholecystectomy in 2019. He still has some epigastric pain that comes and goes, though pain is not as severe as previously. He reports that he had one episode of rectal bleeding with blood in the toilet about 2 months ago, he states [facility] and had a negative FOBT. States that he notices occasional paper hematochezia. He  reports that he has had some decrease in his jean size, however, he is up 12 pounds today from last visit in July 2022. He has not been on PPI for about 3 months. He denies any symptoms of acid reflux or acid regurgitation. He reports that epigastric pain was not improved on PPI. He has occasional nausea without vomiting.    NSAID use: no NSAID use Social hx: previously drank alcohol, no tobacco  Fam hx: no crc or liver disease  Last Colonoscopy: never Last Endoscopy:08/16/20 normal, biopsies of stomach and duodenum taken    Past Medical History:  Diagnosis Date   ADHD (attention deficit hyperactivity disorder)    Asthma    Gastroenteritis    Hypertension    Left shoulder pain    Meningitis    Skull fracture (HCC)    Subarachnoid hemorrhage (HCC)     Past Surgical History:  Procedure Laterality Date   APPENDECTOMY     BIOPSY  08/16/2020   Procedure: BIOPSY;  Surgeon: Dolores Frameastaneda Mayorga, Daniel, MD;  [facility];  Service: Gastroenterology;;   CHOLECYSTECTOMY     ESOPHAGOGASTRODUODENOSCOPY (EGD) WITH PROPOFOL N/A 08/16/2020   Procedure: ESOPHAGOGASTRODUODENOSCOPY (EGD) WITH PROPOFOL;  Surgeon: Dolores Frameastaneda Mayorga, Daniel, MD;  Location: [facility];  Service: Gastroenterology;  Laterality: N/A;  10:10 - ok per Britta MccreedyBarbara   HERNIA REPAIR      Current Outpatient Medications  Medication Sig Dispense Refill   albuterol (VENTOLIN HFA) 108 (90 Base) MCG/ACT inhaler Inhale 1 puff into the lungs every 4 (four) hours as needed for  wheezing or shortness of breath (cough). 8 g 2   Bismuth 262 MG CHEW Chew 2 each by mouth every 6 (six) hours. 112 tablet 0   colestipol (COLESTID) 1 g tablet Take 2 tablets (2 g total) by mouth daily. 90 tablet 3   hyoscyamine (LEVSIN SL) 0.125 MG SL tablet Place 1 tablet (0.125 mg total) under the tongue every 6 (six) hours as needed (abdominal pain). 30 tablet 3   loratadine (CLARITIN) 10 MG tablet Take 10 mg by mouth daily.     metoprolol succinate  (TOPROL-XL) 50 MG 24 hr tablet Take 50 mg by mouth daily. Take with or immediately following a meal.     pantoprazole (PROTONIX) 40 MG tablet Take 1 tablet (40 mg total) by mouth daily. 30 tablet 0   sildenafil (VIAGRA) 100 MG tablet 1 tablet by mouth 1 hour prior to intercourse (Patient taking differently: Take 100 mg by mouth daily as needed for erectile dysfunction (1 hour prior to intercourse).) 6 tablet 0   sucralfate (CARAFATE) 1 GM/10ML suspension Take 10 mLs (1 g total) by mouth 4 (four) times daily -  with meals and at bedtime. 420 mL 0   No current facility-administered medications for this visit.    Allergies as of 02/20/2021 - Review Complete 02/20/2021  Allergen Reaction Noted   Lisinopril Other (See Comments) 08/10/2019   Sulfa antibiotics Hives 10/30/2010    Family History  Problem Relation Age of Onset   Diabetes Mother    COPD Mother    Diabetes Father    Hypertension Father    Heart disease Father     Social History   Socioeconomic History   Marital status: Single    Spouse name: Not on file   Number of children: Not on file   Years of education: Not on file   Highest education level: Not on file  Occupational History   Not on file  Tobacco Use   Smoking status: Never   Smokeless tobacco: Never  Vaping Use   Vaping Use: Never used  Substance and Sexual Activity   Alcohol use: Not Currently   Drug use: No   Sexual activity: Not Currently  Other Topics Concern   Not on file  Social History Narrative   Not on file   Social Determinants of Health   Financial Resource Strain: Not on file  Food Insecurity: Not on file  Transportation Needs: Not on file  Physical Activity: Not on file  Stress: Not on file  Social Connections: Not on file    Review of systems General: negative for malaise, night sweats, fever, chills, weight los Neck: Negative for lumps, goiter, pain and significant neck swelling Resp: Negative for cough, wheezing, dyspnea at  rest CV: Negative for chest pain, leg swelling, palpitations, orthopnea GI: denies melena, hematochezia, nausea, vomiting, diarrhea, constipation, dysphagia, odyonophagia, early satiety or unintentional weight loss.  MSK: Negative for joint pain or swelling, back pain, and muscle pain. Derm: Negative for itching or rash Psych: Denies depression, anxiety, memory loss, confusion. No homicidal or suicidal ideation.  Heme: Negative for prolonged bleeding, bruising easily, and swollen nodes. Endocrine: Negative for cold or heat intolerance, polyuria, polydipsia and goiter. Neuro: negative for tremor, gait imbalance, syncope and seizures. The remainder of the review of systems is noncontributory.  Physical Exam: BP 137/88 (BP Location: Right Arm, Patient Position: Sitting, Cuff Size: Large)    Pulse 98    Temp 97.9 F (36.6 C) (Oral)    Ht 6' (  1.829 m)    Wt (!) 300 lb 9.6 oz (136.4 kg)    BMI 40.77 kg/m  General:   Alert and oriented. No distress noted. Pleasant and cooperative.  Head:  Normocephalic and atraumatic. Eyes:  Conjuctiva clear without scleral icterus. Mouth:  Oral mucosa pink and moist. Good dentition. No lesions. Heart: Normal rate and rhythm, s1 and s2 heart sounds present.  Lungs: Clear lung sounds in all lobes. Respirations equal and unlabored. Abdomen:  +BS, soft, non-tender and non-distended. No rebound or guarding. No HSM or masses noted. Derm: No palmar erythema or jaundice Msk:  Symmetrical without gross deformities. Normal posture. Extremities:  Without edema. Neurologic:  Alert and  oriented x4 Psych:  Alert and cooperative. Normal mood and affect.  Invalid input(s): 6 MONTHS   ASSESSMENT: Logan Dawson is a 26 y.o. male presenting today for ongoing epigastric pain and diarrhea.  Patient was treated for H pylori in July 2022, however, failed to complete follow up urea breath test to confirm eradication. He continues to have epigastric pain. We will do urea breath  test to see if H pylori has resolved, if it has, we will proceed with capsule endoscopy for further evaluation of epigastric pain, as last EGD 08/16/20 with only findings of H pylori. Patient is amenable to this plan.  As far as diarrhea, patient did not pick up colestid or levsin after his last visit, given diarrhea started shortly after cholecystectomy, high suspicion for bile acid diarrhea. I discussed this with the patient. I will resend Colestid 2g daily and Levsin 0.125mg  Q6H to his pharmacy for him to try. If he does not get any relief of diarrhea from these medications, he will let me know. We may need to increase dosage of colestid if symptoms do not improve with initial starting dose of 2g.  Notably he had one episode of rectal bleeding a few months back with blood in the toilet, however, he has not presented with anymore large volume rectal bleeding and notices occasional pink tinge on the paper after wiping, though I suspect this could be related to hemorrhoids, given his frequent BMs. He will make me aware of any further rectal bleeding. Will consider further evaluation via colonoscopy if symptoms fail to improve or rectal bleeding recurs. Hgb in nov 2022 was 15.5. patient agreeable with plan, all questions answered.   PLAN:  Urea breath test 2. Colestid 2g daily 3. Levsin 0.125mg  Q6H PRN 4. Consider capsule endoscopy if H pylori testing is negative 5. Consider colonoscopy if diarrhea persists or rectal bleeding recurs   Follow Up: TBD  Stephania Macfarlane L. Jeanmarie Hubert, MSN, APRN, AGNP-C Adult-Gerontology Nurse Practitioner [facility]

## 2021-02-21 ENCOUNTER — Telehealth (INDEPENDENT_AMBULATORY_CARE_PROVIDER_SITE_OTHER): Payer: Self-pay | Admitting: Gastroenterology

## 2021-02-21 NOTE — Telephone Encounter (Signed)
Patient called this am for the results of his H Pylori test and to report he is still having upper mid abdominal pain that comes and goes, no vomiting, no fevers. He started taking the Colestipol last night he took one, this am he took one and he plans on taking another at [employer]. He has taken Hyoscyamine last night once and not since then. I made him aware the H pylori was not back yet, as soon as it is we will contact him. Please advised regarding abdominal pain.

## 2021-02-21 NOTE — Telephone Encounter (Signed)
Pt called to see if his lab results were available and also wanted the nurse to know that he is having bad abdominal pain. He was seen in office yesterday. Please advise. [phone]

## 2021-02-22 ENCOUNTER — Other Ambulatory Visit (INDEPENDENT_AMBULATORY_CARE_PROVIDER_SITE_OTHER): Payer: Self-pay | Admitting: Gastroenterology

## 2021-02-22 LAB — H. PYLORI BREATH TEST: H. pylori Breath Test: DETECTED — AB

## 2021-02-22 MED ORDER — CLARITHROMYCIN 500 MG PO TABS: 500.0000 mg | ORAL_TABLET | Freq: Two times a day (BID) | ORAL | 0 refills | Status: DC

## 2021-02-22 MED ORDER — METRONIDAZOLE 500 MG PO TABS: 500.0000 mg | ORAL_TABLET | Freq: Two times a day (BID) | ORAL | 0 refills | Status: DC

## 2021-02-22 MED ORDER — AMOXICILLIN 500 MG PO TABS: 1000.0000 mg | ORAL_TABLET | Freq: Two times a day (BID) | ORAL | 0 refills | Status: DC

## 2021-02-22 MED ORDER — PANTOPRAZOLE SODIUM 40 MG PO TBEC: 40.0000 mg | DELAYED_RELEASE_TABLET | Freq: Two times a day (BID) | ORAL | 0 refills | Status: DC

## 2021-02-22 NOTE — Progress Notes (Signed)
Please let Logan Dawson know that H pylori testing was positive, indicating he has an active infection in his stomach.   I am sending the following treatment regimen to his pharmacy. -Clarithromycin 500mg  BID -Amoxicillin 1000mg  BID -Flagyl 500mg  BID -Protonix 40mg  BID    Please advise him absolutely no alcohol intake with the flagyl as this will make him very sick. He needs to complete a course of 14 days total with all of these medications. We will repeat H pylori breath test to evaluate for eradication of infection after he completes the course, will need to be off meds x2 week before repeat breath test can be completed, please have him let know once he has completed the regimen. Thanks!

## 2021-02-22 NOTE — Telephone Encounter (Signed)
Patient aware of all.

## 2021-02-23 ENCOUNTER — Telehealth: Payer: Self-pay | Admitting: Family Medicine

## 2021-02-23 ENCOUNTER — Encounter: Payer: Self-pay | Admitting: Family Medicine

## 2021-02-23 ENCOUNTER — Ambulatory Visit (INDEPENDENT_AMBULATORY_CARE_PROVIDER_SITE_OTHER): Payer: 59 | Admitting: Family Medicine

## 2021-02-23 VITALS — BP 137/83 | HR 90 | Temp 98.9°F | Ht 72.0 in | Wt 299.0 lb

## 2021-02-23 DIAGNOSIS — M545 Low back pain, unspecified: Secondary | ICD-10-CM

## 2021-02-23 DIAGNOSIS — G8929 Other chronic pain: Secondary | ICD-10-CM

## 2021-02-23 DIAGNOSIS — I10 Essential (primary) hypertension: Secondary | ICD-10-CM

## 2021-02-23 DIAGNOSIS — H60313 Diffuse otitis externa, bilateral: Secondary | ICD-10-CM | POA: Diagnosis not present

## 2021-02-23 MED ORDER — CIPROFLOXACIN-DEXAMETHASONE 0.3-0.1 % OT SUSP
4.0000 [drp] | Freq: Two times a day (BID) | OTIC | 0 refills | Status: DC
Start: 2021-02-23 — End: 2021-03-07

## 2021-02-23 MED ORDER — METOPROLOL SUCCINATE ER 50 MG PO TB24: 50.0000 mg | ORAL_TABLET | Freq: Every day | ORAL | 1 refills | Status: DC

## 2021-02-23 MED ORDER — PREDNISONE 20 MG PO TABS
ORAL_TABLET | ORAL | 0 refills | Status: DC
Start: 2021-02-23 — End: 2021-03-07
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
## 2021-02-23 NOTE — Telephone Encounter (Signed)
Pt wants his Ortho referral sent to [phone]
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
## 2021-02-24 ENCOUNTER — Other Ambulatory Visit (INDEPENDENT_AMBULATORY_CARE_PROVIDER_SITE_OTHER): Payer: Self-pay | Admitting: Gastroenterology

## 2021-02-24 ENCOUNTER — Telehealth (INDEPENDENT_AMBULATORY_CARE_PROVIDER_SITE_OTHER): Payer: Self-pay

## 2021-02-24 MED ORDER — PROMETHAZINE HCL 12.5 MG PO TABS: 12.5000 mg | ORAL_TABLET | Freq: Three times a day (TID) | ORAL | 0 refills | Status: DC | PRN

## 2021-02-24 NOTE — Telephone Encounter (Signed)
Patient states he was told he had H pylori and he had several medications sent in for this. He states he feels nauseous with it,but has not vomited. I informed him that yes sometimes the treatment can cause nausea,but he has to try to finish the course. He states understanding,but want something sent in for Nausea. I advised we close today at 11:30 am and it is now 10:23 am, if he did not hear back from the provider today to reach out to his pcp for the nausea mediation. He states understanding.

## 2021-02-27 NOTE — Telephone Encounter (Signed)
I called and made the patient aware.

## 2021-03-02 ENCOUNTER — Ambulatory Visit: Payer: Self-pay | Admitting: Family Medicine

## 2021-03-02 ENCOUNTER — Other Ambulatory Visit: Payer: Self-pay

## 2021-03-02 ENCOUNTER — Ambulatory Visit: Payer: 59 | Admitting: Orthopaedic Surgery

## 2021-03-07 ENCOUNTER — Encounter: Payer: Self-pay | Admitting: Family Medicine

## 2021-03-07 ENCOUNTER — Ambulatory Visit (INDEPENDENT_AMBULATORY_CARE_PROVIDER_SITE_OTHER): Payer: 59 | Admitting: Family Medicine

## 2021-03-07 VITALS — BP 130/86 | HR 107 | Temp 98.7°F | Ht 72.0 in | Wt 313.0 lb

## 2021-03-07 DIAGNOSIS — I10 Essential (primary) hypertension: Secondary | ICD-10-CM | POA: Insufficient documentation

## 2021-03-07 DIAGNOSIS — R911 Solitary pulmonary nodule: Secondary | ICD-10-CM | POA: Diagnosis not present

## 2021-03-07 DIAGNOSIS — R454 Irritability and anger: Secondary | ICD-10-CM

## 2021-03-07 DIAGNOSIS — Z23 Encounter for immunization: Secondary | ICD-10-CM | POA: Diagnosis not present

## 2021-03-07 DIAGNOSIS — F411 Generalized anxiety disorder: Secondary | ICD-10-CM

## 2021-03-07 DIAGNOSIS — F320 Major depressive disorder, single episode, mild: Secondary | ICD-10-CM | POA: Diagnosis not present

## 2021-03-07 MED ORDER — FLUOXETINE HCL 20 MG PO CAPS: 20.0000 mg | ORAL_CAPSULE | Freq: Every day | ORAL | 3 refills | Status: DC

## 2021-03-07 MED ORDER — METOPROLOL SUCCINATE ER 50 MG PO TB24: 100.0000 mg | ORAL_TABLET | Freq: Every day | ORAL | 1 refills | Status: DC

## 2021-03-07 NOTE — Progress Notes (Signed)
Subjective:  Patient ID: Logan Dawson, male    DOB: 1995/12/02, 26 y.o.   MRN: [id number]  Patient Care Team: Baruch Gouty, FNP as PCP - General (Family Medicine)   Chief Complaint:  Lung Lesion   HPI: Logan Dawson is a 26 y.o. male presenting on 03/07/2021 for Lung Lesion   Pt presents today for possible referral to pulmonology for lung nodule noted on recent imaging in the ED at [facility] He had a chest xray and CT abdomen which revealed a 4 mm left lower lobe lung nodule. Upon reviewing old imaging, this nodule was present in 01/2020 and has not changed. Radiologist noted no change as well and did not recommend follow up imaging.  Pt states he has noted his blood pressure has also been running higher than normal. He denies headache, chest pain, palpitations, shortness of breath, or leg swelling. He has been taking BB therapy as prescribed.  He also has concerns of increased anger, anxiety, and altered mood. States he was on fluoxetine in the past and tolerated this very well. States he has not been on medications in a while but feels he may need to restart medications. States he is starting to have anger outbursts at his family including his child. Denies SI or HI.   Depression screen [facility] 2/9 03/07/2021 02/23/2021 07/20/2016  Decreased Interest 0 0 0  Down, Depressed, Hopeless 0 1 0  PHQ - 2 Score 0 1 0  Altered sleeping 3 0 -  Tired, decreased energy 3 3 -  Change in appetite 3 3 -  Feeling bad or failure about yourself  0 0 -  Trouble concentrating 0 0 -  Moving slowly or fidgety/restless 3 0 -  Suicidal thoughts 0 0 -  PHQ-9 Score 12 7 -  Difficult doing work/chores Not difficult at all Somewhat difficult -   GAD 7 : Generalized Anxiety Score 03/07/2021 02/23/2021  Nervous, Anxious, on Edge 0 0  Control/stop worrying 0 0  Worry too much - different things 0 0  Trouble relaxing 3 2  Restless 3 3  Easily annoyed or irritable 3 3  Afraid - awful might happen 0 0   Total GAD 7 Score 9 8  Anxiety Difficulty Very difficult Somewhat difficult      Relevant past medical, surgical, family, and social history reviewed and updated as indicated.  Allergies and medications reviewed and updated. Data reviewed: Chart in Epic.   Past Medical History:  Diagnosis Date   ADHD (attention deficit hyperactivity disorder)    Asthma    Gastroenteritis    Hypertension    Left shoulder pain    Meningitis    Skull fracture (HCC)    Subarachnoid hemorrhage ([place])     Past Surgical History:  Procedure Laterality Date   APPENDECTOMY     BIOPSY  08/16/2020   Procedure: BIOPSY;  Surgeon: Harvel Quale, MD;  Location: [facility];  Service: Gastroenterology;;   CHOLECYSTECTOMY     ESOPHAGOGASTRODUODENOSCOPY (EGD) WITH PROPOFOL N/A 08/16/2020   Jenetta Downer, H pylori positive   HERNIA REPAIR      Social History   Socioeconomic History   Marital status: Single    Spouse name: Not on file   Number of children: Not on file   Years of education: Not on file   Highest education level: Not on file  Occupational History   Not on file  Tobacco Use   Smoking status: Never  Smokeless tobacco: Never  Vaping Use   Vaping Use: Never used  Substance and Sexual Activity   Alcohol use: Not Currently   Drug use: No   Sexual activity: Not Currently  Other Topics Concern   Not on file  Social History Narrative   Not on file   Social Determinants of Health   Financial Resource Strain: Not on file  Food Insecurity: Not on file  Transportation Needs: Not on file  Physical Activity: Not on file  Stress: Not on file  Social Connections: Not on file  Intimate Partner Violence: Not on file    Outpatient Encounter Medications as of 03/07/2021  Medication Sig   albuterol (VENTOLIN HFA) 108 (90 Base) MCG/ACT inhaler Inhale 1 puff into the lungs every 4 (four) hours as needed for wheezing or shortness of breath (cough).   amoxicillin (AMOXIL) 500 MG  tablet Take 2 tablets (1,000 mg total) by mouth 2 (two) times daily.   clarithromycin (BIAXIN) 500 MG tablet Take 1 tablet (500 mg total) by mouth 2 (two) times daily.   colestipol (COLESTID) 1 g tablet Take 2 tablets (2 g total) by mouth daily.   FLUoxetine (PROZAC) 20 MG capsule Take 1 capsule (20 mg total) by mouth daily.   loratadine (CLARITIN) 10 MG tablet Take 10 mg by mouth daily.   metroNIDAZOLE (FLAGYL) 500 MG tablet Take 1 tablet (500 mg total) by mouth 2 (two) times daily.   pantoprazole (PROTONIX) 40 MG tablet Take 1 tablet (40 mg total) by mouth 2 (two) times daily.   sucralfate (CARAFATE) 1 GM/10ML suspension Take 10 mLs (1 g total) by mouth 4 (four) times daily -  with meals and at bedtime.   [DISCONTINUED] ciprofloxacin-dexamethasone (CIPRODEX) OTIC suspension Place 4 drops into both ears 2 (two) times daily.   [DISCONTINUED] metoprolol succinate (TOPROL-XL) 50 MG 24 hr tablet Take 1 tablet (50 mg total) by mouth daily. Take with or immediately following a meal.   metoprolol succinate (TOPROL-XL) 50 MG 24 hr tablet Take 2 tablets (100 mg total) by mouth daily. Take with or immediately following a meal.   [DISCONTINUED] dicyclomine (BENTYL) 20 MG tablet Take 1 tablet (20 mg total) by mouth every 8 (eight) hours as needed for spasms.   [DISCONTINUED] hyoscyamine (LEVSIN SL) 0.125 MG SL tablet Place 1 tablet (0.125 mg total) under the tongue every 6 (six) hours as needed (abdominal pain).   [DISCONTINUED] predniSONE (DELTASONE) 20 MG tablet 2 po at sametime daily for 5 days- start tomorrow   [DISCONTINUED] promethazine (PHENERGAN) 12.5 MG tablet Take 1 tablet (12.5 mg total) by mouth every 8 (eight) hours as needed for nausea or vomiting.   No facility-administered encounter medications on file as of 03/07/2021.    Allergies  Allergen Reactions   Lisinopril Other (See Comments)    Unknown reaction Unknown reaction Other reaction(s): elevated BP Unknown reaction  Other  reaction(s): elevated BP   Sulfa Antibiotics Hives    Review of Systems  Constitutional:  Positive for activity change, appetite change and fatigue. Negative for chills, diaphoresis, fever and unexpected weight change.  Eyes:  Negative for photophobia and visual disturbance.  Respiratory:  Negative for cough and shortness of breath.   Cardiovascular:  Negative for chest pain, palpitations and leg swelling.  Gastrointestinal:  Negative for abdominal pain.  Genitourinary:  Negative for decreased urine volume and difficulty urinating.  Neurological:  Negative for dizziness, tremors, seizures, syncope, facial asymmetry, speech difficulty, weakness, light-headedness, numbness and headaches.  Psychiatric/Behavioral:  Positive for agitation, decreased concentration, dysphoric mood and sleep disturbance. Negative for behavioral problems, confusion, hallucinations, self-injury and suicidal ideas. The patient is nervous/anxious. The patient is not hyperactive.   All other systems reviewed and are negative.      Objective:  BP 130/86    Pulse (!) 107    Temp 98.7 F (37.1 C)    Ht 6' (1.829 m)    Wt (!) 313 lb (142 kg)    SpO2 94%    BMI 42.45 kg/m    Wt Readings from Last 3 Encounters:  03/07/21 (!) 313 lb (142 kg)  02/23/21 299 lb (135.6 kg)  02/20/21 (!) 300 lb 9.6 oz (136.4 kg)    Physical Exam Vitals and nursing note reviewed.  Constitutional:      General: He is not in acute distress.    Appearance: Normal appearance. He is well-developed and well-groomed. He is obese. He is not ill-appearing, toxic-appearing or diaphoretic.  HENT:     Head: Normocephalic and atraumatic.     Jaw: There is normal jaw occlusion.     Right Ear: Hearing normal.     Left Ear: Hearing normal.     Nose: Nose normal.     Mouth/Throat:     Lips: Pink.     Mouth: Mucous membranes are moist.     Pharynx: Oropharynx is clear. Uvula midline.  Eyes:     General: Lids are normal.     Extraocular Movements:  Extraocular movements intact.     Conjunctiva/sclera: Conjunctivae normal.     Pupils: Pupils are equal, round, and reactive to light.  Neck:     Thyroid: No thyroid mass, thyromegaly or thyroid tenderness.     Vascular: No carotid bruit or JVD.     Trachea: Trachea and phonation normal.  Cardiovascular:     Rate and Rhythm: Regular rhythm. Tachycardia present.     Chest Wall: PMI is not displaced.     Pulses: Normal pulses.     Heart sounds: Normal heart sounds. No murmur heard.   No friction rub. No gallop.  Pulmonary:     Effort: Pulmonary effort is normal. No respiratory distress.     Breath sounds: Normal breath sounds. No wheezing.  Abdominal:     General: Bowel sounds are normal. There is no distension or abdominal bruit.     Palpations: Abdomen is soft. There is no hepatomegaly or splenomegaly.     Tenderness: There is no abdominal tenderness. There is no right CVA tenderness or left CVA tenderness.     Hernia: No hernia is present.  Musculoskeletal:        General: Normal range of motion.     Cervical back: Normal range of motion and neck supple.     Right lower leg: No edema.     Left lower leg: No edema.  Lymphadenopathy:     Cervical: No cervical adenopathy.  Skin:    General: Skin is warm and dry.     Capillary Refill: Capillary refill takes less than 2 seconds.     Coloration: Skin is not cyanotic, jaundiced or pale.     Findings: No rash.  Neurological:     General: No focal deficit present.     Mental Status: He is alert and oriented to person, place, and time.     Sensory: Sensation is intact.     Motor: Motor function is intact.     Coordination: Coordination is intact.     Gait:  Gait is intact.     Deep Tendon Reflexes: Reflexes are normal and symmetric.  Psychiatric:        Attention and Perception: Attention and perception normal.        Mood and Affect: Mood and affect normal.        Speech: Speech normal.        Behavior: Behavior normal. Behavior  is cooperative.        Thought Content: Thought content normal.        Cognition and Memory: Cognition and memory normal.        Judgment: Judgment normal.    Results for orders placed or performed in visit on 02/20/21  H. pylori breath test  Result Value Ref Range   H. pylori Breath Test DETECTED (A) NOT DETECTED       Pertinent labs & imaging results that were available during my care of the patient were reviewed by me and considered in my medical decision making.  Assessment & Plan:  L…O was seen today for lung lesion.  Diagnoses and all orders for this visit:  Lung nodule, solitary No changes in size of nodule since 2021. Discussed this in detail with pt. Will monitor periodically if warranted.   Unable to control anger GAD (generalized anxiety disorder) Depression, major, single episode, mild (HCC) New and worsening symptoms over the last several months. Willing to restart medications as prescribed as he has tolerated fluoxetine in the past. Follow up in 2 weeks for reevaluation.  -     FLUoxetine (PROZAC) 20 MG capsule; Take 1 capsule (20 mg total) by mouth daily.  Primary hypertension HR and BP remain slightly elevated, will increase Toprol to 100 mg daily. Avoid caffeine or other stimulants. Monitor BP and HR and report any persistent abnormal readings. Follow up in 2 weeks for reevaluation.  -     metoprolol succinate (TOPROL-XL) 50 MG 24 hr tablet; Take 2 tablets (100 mg total) by mouth daily. Take with or immediately following a meal.     Continue all other maintenance medications.  Follow up plan: Return in about 2 weeks (around 03/21/2021), or if symptoms worsen or fail to improve, for Anxiety, Depression, HTN, tachycardia.   Continue healthy lifestyle choices, including diet (rich in fruits, vegetables, and lean proteins, and low in salt and simple carbohydrates) and exercise (at least 30 minutes of moderate physical activity daily).  Educational handout  given for depression   The above assessment and management plan was discussed with the patient. The patient verbalized understanding of and has agreed to the management plan. Patient is aware to call the clinic if they develop any new symptoms or if symptoms persist or worsen. Patient is aware when to return to the clinic for a follow-up visit. Patient educated on when it is appropriate to go to the emergency department.   Monia Pouch, FNP-C [facility] [phone]

## 2021-03-07 NOTE — Patient Instructions (Signed)

## 2021-03-09 ENCOUNTER — Ambulatory Visit (INDEPENDENT_AMBULATORY_CARE_PROVIDER_SITE_OTHER): Payer: 59 | Admitting: Gastroenterology

## 2021-03-10 ENCOUNTER — Ambulatory Visit: Payer: 59 | Admitting: Family Medicine

## 2021-03-14 ENCOUNTER — Ambulatory Visit (INDEPENDENT_AMBULATORY_CARE_PROVIDER_SITE_OTHER): Payer: 59 | Admitting: Internal Medicine

## 2021-03-16 ENCOUNTER — Telehealth (INDEPENDENT_AMBULATORY_CARE_PROVIDER_SITE_OTHER): Payer: Self-pay | Admitting: *Deleted

## 2021-03-16 ENCOUNTER — Other Ambulatory Visit (INDEPENDENT_AMBULATORY_CARE_PROVIDER_SITE_OTHER): Payer: Self-pay | Admitting: *Deleted

## 2021-03-16 DIAGNOSIS — Z8619 Personal history of other infectious and parasitic diseases: Secondary | ICD-10-CM

## 2021-03-16 NOTE — Telephone Encounter (Signed)
Seen 1/16 and still having diarrhea and would like to set up colonoscopy.

## 2021-03-17 ENCOUNTER — Other Ambulatory Visit (INDEPENDENT_AMBULATORY_CARE_PROVIDER_SITE_OTHER): Payer: Self-pay | Admitting: Gastroenterology

## 2021-03-17 DIAGNOSIS — K9089 Other intestinal malabsorption: Secondary | ICD-10-CM

## 2021-03-17 MED ORDER — COLESTIPOL HCL 1 G PO TABS: 2.0000 g | ORAL_TABLET | Freq: Two times a day (BID) | ORAL | 1 refills | Status: DC

## 2021-03-17 NOTE — Progress Notes (Signed)
Patient still remains with diarrhea, despite colstipol 2g daily. We will increase dosage to 2g BID.

## 2021-03-17 NOTE — Telephone Encounter (Signed)
Called and discussed with pt. Pt states he is taking 2 tablets every day. He will do h pylori test next week after 2 /14. And he would like rx to go to [facility]

## 2021-03-21 ENCOUNTER — Telehealth: Payer: Self-pay | Admitting: Family Medicine

## 2021-03-21 ENCOUNTER — Encounter: Payer: Self-pay | Admitting: Family Medicine

## 2021-03-21 ENCOUNTER — Ambulatory Visit (INDEPENDENT_AMBULATORY_CARE_PROVIDER_SITE_OTHER): Payer: 59 | Admitting: Family Medicine

## 2021-03-21 VITALS — BP 125/82 | HR 100 | Temp 98.2°F | Ht 72.0 in | Wt 303.0 lb

## 2021-03-21 DIAGNOSIS — F320 Major depressive disorder, single episode, mild: Secondary | ICD-10-CM | POA: Diagnosis not present

## 2021-03-21 DIAGNOSIS — F411 Generalized anxiety disorder: Secondary | ICD-10-CM | POA: Diagnosis not present

## 2021-03-21 DIAGNOSIS — Z7689 Persons encountering health services in other specified circumstances: Secondary | ICD-10-CM

## 2021-03-21 DIAGNOSIS — R454 Irritability and anger: Secondary | ICD-10-CM | POA: Diagnosis not present

## 2021-03-21 MED ORDER — SEMAGLUTIDE-WEIGHT MANAGEMENT 1 MG/0.5ML ~~LOC~~ SOAJ: 1.0000 mg | SUBCUTANEOUS | 0 refills | Status: DC

## 2021-03-21 MED ORDER — SEMAGLUTIDE-WEIGHT MANAGEMENT 0.25 MG/0.5ML ~~LOC~~ SOAJ: 0.2500 mg | SUBCUTANEOUS | 0 refills | Status: DC

## 2021-03-21 MED ORDER — SEMAGLUTIDE-WEIGHT MANAGEMENT 0.5 MG/0.5ML ~~LOC~~ SOAJ: 0.5000 mg | SUBCUTANEOUS | 0 refills | Status: DC

## 2021-03-21 MED ORDER — SEMAGLUTIDE-WEIGHT MANAGEMENT 2.4 MG/0.75ML ~~LOC~~ SOAJ: 2.4000 mg | SUBCUTANEOUS | 0 refills | Status: DC

## 2021-03-21 MED ORDER — SEMAGLUTIDE-WEIGHT MANAGEMENT 1.7 MG/0.75ML ~~LOC~~ SOAJ: 1.7000 mg | SUBCUTANEOUS | 0 refills | Status: DC

## 2021-03-21 MED ORDER — ORLISTAT 60 MG PO CAPS: 60.0000 mg | ORAL_CAPSULE | Freq: Three times a day (TID) | ORAL | 1 refills | Status: DC

## 2021-03-21 NOTE — Progress Notes (Signed)
Subjective:  Patient ID: Logan Dawson, male    DOB: December 02, 1995, 26 y.o.   MRN: [id number]  Patient Care Team: Baruch Gouty, FNP as PCP - General (Family Medicine)   Chief Complaint:  Anxiety, Depression, Hypertension, and Obesity   HPI: Logan Dawson is a 26 y.o. male presenting on 03/21/2021 for Anxiety, Depression, Hypertension, and Obesity   Pt presents today for depression and anxiety follow up. He was started on Fluoxetine and has been doing great. States he feels the medication has been very beneficial. He denies adverse side effects. He reports great control of blood pressure with current regimen. He has cut back on salty foods. He has changed diet significantly and started to exercise. States he still can not manage to drop weight despite efforts.   Anxiety Presents for follow-up visit. Patient reports no chest pain, compulsions, confusion, decreased concentration, depressed mood, dizziness, dry mouth, excessive worry, feeling of choking, hyperventilation, impotence, insomnia, irritability, malaise, muscle tension, nausea, nervous/anxious behavior, obsessions, palpitations, panic, restlessness, shortness of breath or suicidal ideas. The quality of sleep is good.   Compliance with medications is 76-100%.  Depression        This is a recurrent problem.  The current episode started more than 1 month ago.   The problem occurs daily.  The problem has been gradually improving since onset.  Associated symptoms include fatigue, decreased interest and appetite change.  Associated symptoms include no decreased concentration, no helplessness, no hopelessness, does not have insomnia, not irritable, no restlessness, no body aches, no myalgias, no headaches, no indigestion, not sad and no suicidal ideas.     The symptoms are aggravated by work stress and family issues.  Past treatments include SSRIs - Selective serotonin reuptake inhibitors.  Compliance with treatment is good.  Previous  treatment provided significant relief.  Past medical history includes anxiety.   Hypertension This is a recurrent problem. The current episode started more than 1 year ago. The problem is controlled. Associated symptoms include anxiety and malaise/fatigue. Pertinent negatives include no blurred vision, chest pain, headaches, neck pain, orthopnea, palpitations, peripheral edema, PND, shortness of breath or sweats. Risk factors for coronary artery disease include male gender and obesity. Past treatments include beta blockers. The current treatment provides significant improvement. Compliance problems include exercise and diet.   GAD 7 : Generalized Anxiety Score 03/21/2021 03/07/2021 02/23/2021  Nervous, Anxious, on Edge 0 0 0  Control/stop worrying 0 0 0  Worry too much - different things 0 0 0  Trouble relaxing 0 3 2  Restless 0 3 3  Easily annoyed or irritable 0 3 3  Afraid - awful might happen 0 0 0  Total GAD 7 Score 0 9 8  Anxiety Difficulty - Very difficult Somewhat difficult    Depression screen [facility] 2/9 03/21/2021 03/07/2021 02/23/2021 07/20/2016  Decreased Interest 1 0 0 0  Down, Depressed, Hopeless 1 0 1 0  PHQ - 2 Score 2 0 1 0  Altered sleeping 1 3 0 -  Tired, decreased energy 1 3 3  -  Change in appetite 2 3 3  -  Feeling bad or failure about yourself  - 0 0 -  Trouble concentrating 0 0 0 -  Moving slowly or fidgety/restless 0 3 0 -  Suicidal thoughts - 0 0 -  PHQ-9 Score 6 12 7  -  Difficult doing work/chores Somewhat difficult Not difficult at all Somewhat difficult -     Relevant past medical, surgical, family,  and social history reviewed and updated as indicated.  Allergies and medications reviewed and updated. Data reviewed: Chart in Epic.   Past Medical History:  Diagnosis Date   ADHD (attention deficit hyperactivity disorder)    Asthma    Gastroenteritis    Hypertension    Left shoulder pain    Meningitis    Skull fracture (HCC)    Subarachnoid hemorrhage (HCC)      Past Surgical History:  Procedure Laterality Date   APPENDECTOMY     BIOPSY  08/16/2020   Procedure: BIOPSY;  Surgeon: Harvel Quale, MD;  Location: [facility];  Service: Gastroenterology;;   CHOLECYSTECTOMY     ESOPHAGOGASTRODUODENOSCOPY (EGD) WITH PROPOFOL N/A 08/16/2020   Jenetta Downer, H pylori positive   HERNIA REPAIR      Social History   Socioeconomic History   Marital status: Single    Spouse name: Not on file   Number of children: Not on file   Years of education: Not on file   Highest education level: Not on file  Occupational History   Not on file  Tobacco Use   Smoking status: Never   Smokeless tobacco: Never  Vaping Use   Vaping Use: Never used  Substance and Sexual Activity   Alcohol use: Not Currently   Drug use: No   Sexual activity: Not Currently  Other Topics Concern   Not on file  Social History Narrative   Not on file   Social Determinants of Health   Financial Resource Strain: Not on file  Food Insecurity: Not on file  Transportation Needs: Not on file  Physical Activity: Not on file  Stress: Not on file  Social Connections: Not on file  Intimate Partner Violence: Not on file    Outpatient Encounter Medications as of 03/21/2021  Medication Sig   albuterol (VENTOLIN HFA) 108 (90 Base) MCG/ACT inhaler Inhale 1 puff into the lungs every 4 (four) hours as needed for wheezing or shortness of breath (cough).   FLUoxetine (PROZAC) 20 MG capsule Take 1 capsule (20 mg total) by mouth daily.   metoprolol succinate (TOPROL-XL) 50 MG 24 hr tablet Take 2 tablets (100 mg total) by mouth daily. Take with or immediately following a meal.   pantoprazole (PROTONIX) 40 MG tablet Take 1 tablet (40 mg total) by mouth 2 (two) times daily.   [DISCONTINUED] amoxicillin (AMOXIL) 500 MG tablet Take 2 tablets (1,000 mg total) by mouth 2 (two) times daily.   [DISCONTINUED] clarithromycin (BIAXIN) 500 MG tablet Take 1 tablet (500 mg total) by mouth 2 (two)  times daily.   [DISCONTINUED] colestipol (COLESTID) 1 g tablet Take 2 tablets (2 g total) by mouth 2 (two) times daily.   [DISCONTINUED] dicyclomine (BENTYL) 20 MG tablet Take 1 tablet (20 mg total) by mouth every 8 (eight) hours as needed for spasms.   [DISCONTINUED] loratadine (CLARITIN) 10 MG tablet Take 10 mg by mouth daily.   [DISCONTINUED] metroNIDAZOLE (FLAGYL) 500 MG tablet Take 1 tablet (500 mg total) by mouth 2 (two) times daily.   [DISCONTINUED] sucralfate (CARAFATE) 1 GM/10ML suspension Take 10 mLs (1 g total) by mouth 4 (four) times daily -  with meals and at bedtime.   No facility-administered encounter medications on file as of 03/21/2021.    Allergies  Allergen Reactions   Lisinopril Other (See Comments)    Unknown reaction Unknown reaction Other reaction(s): elevated BP Unknown reaction  Other reaction(s): elevated BP   Sulfa Antibiotics Hives    Review of  Systems  Constitutional:  Positive for activity change, appetite change, fatigue and malaise/fatigue. Negative for chills, diaphoresis, fever, irritability and unexpected weight change.  Eyes:  Negative for blurred vision, photophobia and visual disturbance.  Respiratory:  Negative for cough and shortness of breath.   Cardiovascular:  Negative for chest pain, palpitations, orthopnea, leg swelling and PND.  Gastrointestinal:  Negative for nausea.  Endocrine: Negative for polydipsia, polyphagia and polyuria.  Genitourinary:  Negative for decreased urine volume, difficulty urinating and impotence.  Musculoskeletal:  Negative for arthralgias, myalgias and neck pain.  Neurological:  Negative for dizziness, tremors, seizures, syncope, facial asymmetry, speech difficulty, weakness, light-headedness, numbness and headaches.  Psychiatric/Behavioral:  Positive for depression and sleep disturbance. Negative for agitation, behavioral problems, confusion, decreased concentration, dysphoric mood, hallucinations, self-injury and  suicidal ideas. The patient is not nervous/anxious, does not have insomnia and is not hyperactive.   All other systems reviewed and are negative.      Objective:  BP 125/82    Pulse 100    Temp 98.2 F (36.8 C) (Temporal)    Ht 6' (1.829 m)    Wt (!) 303 lb (137.4 kg)    SpO2 95%    BMI 41.09 kg/m    Wt Readings from Last 3 Encounters:  03/21/21 (!) 303 lb (137.4 kg)  03/07/21 (!) 313 lb (142 kg)  02/23/21 299 lb (135.6 kg)    Physical Exam Vitals and nursing note reviewed.  Constitutional:      General: He is not irritable.He is not in acute distress.    Appearance: Normal appearance. He is well-developed and well-groomed. He is obese. He is not ill-appearing, toxic-appearing or diaphoretic.  HENT:     Head: Normocephalic and atraumatic.     Jaw: There is normal jaw occlusion.     Right Ear: Hearing normal.     Left Ear: Hearing normal.     Nose: Nose normal.     Mouth/Throat:     Lips: Pink.     Mouth: Mucous membranes are moist.     Pharynx: Oropharynx is clear. Uvula midline.  Eyes:     General: Lids are normal.     Extraocular Movements: Extraocular movements intact.     Conjunctiva/sclera: Conjunctivae normal.     Pupils: Pupils are equal, round, and reactive to light.  Neck:     Thyroid: No thyroid mass, thyromegaly or thyroid tenderness.     Vascular: No carotid bruit or JVD.     Trachea: Trachea and phonation normal.  Cardiovascular:     Rate and Rhythm: Normal rate and regular rhythm.     Chest Wall: PMI is not displaced.     Pulses: Normal pulses.     Heart sounds: Normal heart sounds. No murmur heard.   No friction rub. No gallop.  Pulmonary:     Effort: Pulmonary effort is normal. No respiratory distress.     Breath sounds: Normal breath sounds. No wheezing.  Abdominal:     General: There is no abdominal bruit.     Palpations: There is no hepatomegaly or splenomegaly.  Musculoskeletal:        General: Normal range of motion.     Cervical back: Normal  range of motion and neck supple.     Right lower leg: No edema.     Left lower leg: No edema.  Lymphadenopathy:     Cervical: No cervical adenopathy.  Skin:    General: Skin is warm and dry.  Capillary Refill: Capillary refill takes less than 2 seconds.     Coloration: Skin is not cyanotic, jaundiced or pale.     Findings: No rash.  Neurological:     General: No focal deficit present.     Mental Status: He is alert and oriented to person, place, and time.     Sensory: Sensation is intact.     Motor: Motor function is intact.     Coordination: Coordination is intact.     Gait: Gait is intact.     Deep Tendon Reflexes: Reflexes are normal and symmetric.  Psychiatric:        Attention and Perception: Attention and perception normal.        Mood and Affect: Mood and affect normal.        Speech: Speech normal.        Behavior: Behavior normal. Behavior is cooperative.        Thought Content: Thought content normal.        Cognition and Memory: Cognition and memory normal.        Judgment: Judgment normal.    Results for orders placed or performed in visit on 02/20/21  H. pylori breath test  Result Value Ref Range   H. pylori Breath Test DETECTED (A) NOT DETECTED       Pertinent labs & imaging results that were available during my care of the patient were reviewed by me and considered in my medical decision making.  Assessment & Plan:  Torien was seen today for anxiety, depression, hypertension and obesity.  Diagnoses and all orders for this visit:  Depression, major, single episode, mild (HCC) GAD (generalized anxiety disorder) Unable to control anger Great improvement with current medication, will continue. Pt aware to report any new or worsening symptoms.   Morbid obesity ([place]) Encounter for weight management Has made lifestyle changes without significant changes in weight. Will trail wegovy along with continued lifestyle changes.     Continue all other maintenance  medications.  Follow up plan: Return in about 2 months (around 05/19/2021), or if symptoms worsen or fail to improve, for weight management.   Continue healthy lifestyle choices, including diet (rich in fruits, vegetables, and lean proteins, and low in salt and simple carbohydrates) and exercise (at least 30 minutes of moderate physical activity daily).  Educational handout given for depression  The above assessment and management plan was discussed with the patient. The patient verbalized understanding of and has agreed to the management plan. Patient is aware to call the clinic if they develop any new symptoms or if symptoms persist or worsen. Patient is aware when to return to the clinic for a follow-up visit. Patient educated on when it is appropriate to go to the emergency department.   Monia Pouch, FNP-C [facility] [phone]

## 2021-03-21 NOTE — Addendum Note (Signed)
Addended by: Sonny Masters on: 03/21/2021 12:56 PM   Modules accepted: Orders

## 2021-03-21 NOTE — Patient Instructions (Signed)

## 2021-03-21 NOTE — Telephone Encounter (Signed)
NA

## 2021-03-21 NOTE — Telephone Encounter (Signed)
Let pt know insurance will not cover weight loss medication. I will send in orlistat to see if covered.

## 2021-03-21 NOTE — Telephone Encounter (Signed)
(  Key: [id number]) Rx #: [id number] MJ.Hock 0.25MG /0.5ML auto-injectors

## 2021-03-21 NOTE — Telephone Encounter (Signed)
Insurance is not going to cover this per pharmacy.

## 2021-03-22 ENCOUNTER — Telehealth: Payer: Self-pay | Admitting: Family Medicine

## 2021-03-22 NOTE — Telephone Encounter (Signed)
LMTCB JBB 2/15

## 2021-03-22 NOTE — Telephone Encounter (Signed)
Patient stated that the shot that was called in for him is over $1,000 and he would like to see if something else can be called in. He is unsure of  the name of the shot. Please call back and advise.

## 2021-03-22 NOTE — Telephone Encounter (Signed)
Medication has been sent to replace it.   Called patient, not available, left message to return call

## 2021-03-23 LAB — H. PYLORI BREATH TEST: H. pylori Breath Test: NOT DETECTED

## 2021-03-23 NOTE — Telephone Encounter (Signed)
Pt called pharmacy about new medicine that was sent in for him (ALLI) and was told that insurance does not cover this medicine either.   Please advise and call patient.

## 2021-03-23 NOTE — Telephone Encounter (Signed)
Patient aware.

## 2021-03-23 NOTE — Telephone Encounter (Signed)
Pt aware that this is over the counter and will buy it

## 2021-03-29 NOTE — Telephone Encounter (Signed)
Your prior authorization request has been denied. °COMPLETE APPEAL °Your request for prior authorization was denied, but an appeal is available for your patient. For assistance, contact our support team at [phone]. ° °Message from plan: PA Case: [id number], Status: Closed, Closed Reason Code: FM Product not covered by this plan. Prior Authorization not available., Closed Rationale: The [employer] Prior Authorization team is unable to review this product for a coverage determination as the requested medication is not on the patient's formulary. Please reach out to [employer] directly for this request. Thank you in advance.. Questions? Contact [phone]. °

## 2021-04-04 ENCOUNTER — Ambulatory Visit: Payer: 59 | Admitting: Family Medicine

## 2021-04-05 ENCOUNTER — Encounter: Payer: Self-pay | Admitting: Family Medicine

## 2021-04-15 ENCOUNTER — Emergency Department (HOSPITAL_COMMUNITY)
Admission: EM | Admit: 2021-04-15 | Discharge: 2021-04-15 | Disposition: A | Discharge status: Home / Self Care | Payer: 59 | Attending: Emergency Medicine | Admitting: Emergency Medicine

## 2021-04-15 ENCOUNTER — Emergency Department (HOSPITAL_COMMUNITY): Payer: 59

## 2021-04-15 ENCOUNTER — Encounter (HOSPITAL_COMMUNITY): Payer: Self-pay | Admitting: Emergency Medicine

## 2021-04-15 ENCOUNTER — Other Ambulatory Visit: Payer: Self-pay

## 2021-04-15 DIAGNOSIS — Z79899 Other long term (current) drug therapy: Secondary | ICD-10-CM | POA: Insufficient documentation

## 2021-04-15 DIAGNOSIS — Z7951 Long term (current) use of inhaled steroids: Secondary | ICD-10-CM | POA: Insufficient documentation

## 2021-04-15 DIAGNOSIS — R053 Chronic cough: Secondary | ICD-10-CM | POA: Insufficient documentation

## 2021-04-15 DIAGNOSIS — J45909 Unspecified asthma, uncomplicated: Secondary | ICD-10-CM | POA: Insufficient documentation

## 2021-04-15 DIAGNOSIS — I10 Essential (primary) hypertension: Secondary | ICD-10-CM | POA: Insufficient documentation

## 2021-04-15 MED ORDER — PANTOPRAZOLE SODIUM 20 MG PO TBEC: 20.0000 mg | DELAYED_RELEASE_TABLET | Freq: Two times a day (BID) | ORAL | 0 refills | Status: AC

## 2021-04-15 MED ORDER — LORATADINE 10 MG PO TABS: 10.0000 mg | ORAL_TABLET | Freq: Every day | ORAL | 0 refills | Status: DC

## 2021-04-15 MED ORDER — FLUTICASONE PROPIONATE 50 MCG/ACT NA SUSP: 1.0000 | Freq: Every day | NASAL | 2 refills | Status: DC

## 2021-04-15 NOTE — ED Triage Notes (Signed)
Patient c/o constant cough, productive cough. Per patient thick white sputum. Per patient cough has been x4 months. Patient seen by [facility] x2 months ago and given antibiotics, prednisone, and tessalon pearls. Patient states no relief with antibiotics or prednisone. Per patient never filled the tessalon pearls "because they don't work for him."  ?

## 2021-04-15 NOTE — Discharge Instructions (Signed)
The x-ray did not show any signs of pneumonia or nodule.  Take the antihistamines and antacids as prescribed.  Follow-up with a primary care doctor to be rechecked to make sure your symptoms are improving ?

## 2021-04-15 NOTE — ED Provider Notes (Signed)
?[facility] ?Provider Note ? ? ?CSN: [id number] ?Arrival date & time: 04/15/21  1446 ? ?  ? ?History ? ?Chief Complaint  ?Patient presents with  ? Cough  ? ? ?Logan Dawson is a 26 y.o. male. ? ? ?Cough ?Associated symptoms: no fever   ? ?Patient has a history of hypertension, skull fracture, asthma, subarachnoid hemorrhage presents with a persistent cough.  Patient states his symptoms have been going on now for or 4 months.  He has a cough that is dry and sometimes productive of thick white sputum.  He denies any trouble with any fevers.  He does have a history of allergies and asthma but does not think that he has been wheezing a lot.  He denies having trouble with scratchy eyes or watery eyes.  Patient did go to an urgent care a couple months ago.  He was given antibiotics prednisone and Tessalon.  Patient states he did not feel any better after the antibiotics prednisone.  He did not bother with the Ladona Ridgel because he has taken them before and they did not help him. ? ?Records reviewed and the patient was seen at [facility] care on February 21.  He was diagnosed with bronchitis sinusitis.  He was given prescriptions for Augmentin and prednisone Tessalon and albuterol.  He also had a COVID flu test that was negative. ? ?Home Medications ?Prior to Admission medications   ?Medication Sig Start Date End Date Taking? Authorizing Provider  ?fluticasone (FLONASE) 50 MCG/ACT nasal spray Place 1 spray into both nostrils daily. 04/15/21  Yes Dorie Rank, MD  ?loratadine (CLARITIN) 10 MG tablet Take 1 tablet (10 mg total) by mouth daily. 04/15/21 05/15/21 Yes Dorie Rank, MD  ?pantoprazole (PROTONIX) 20 MG tablet Take 1 tablet (20 mg total) by mouth 2 (two) times daily. 04/15/21 05/15/21 Yes Dorie Rank, MD  ?albuterol (VENTOLIN HFA) 108 (90 Base) MCG/ACT inhaler Inhale 1 puff into the lungs every 4 (four) hours as needed for wheezing or shortness of breath (cough). 08/12/19   Johnson, Clanford L, MD   ?FLUoxetine (PROZAC) 20 MG capsule Take 1 capsule (20 mg total) by mouth daily. 03/07/21   Baruch Gouty, FNP  ?metoprolol succinate (TOPROL-XL) 50 MG 24 hr tablet Take 2 tablets (100 mg total) by mouth daily. Take with or immediately following a meal. 03/07/21   Rakes, Connye Burkitt, FNP  ?orlistat (ALLI) 60 MG capsule Take 1 capsule (60 mg total) by mouth 3 (three) times daily with meals. 03/21/21 06/19/21  Baruch Gouty, FNP  ?dicyclomine (BENTYL) 20 MG tablet Take 1 tablet (20 mg total) by mouth every 8 (eight) hours as needed for spasms. 12/15/18 06/07/19  Petrucelli, Glynda Jaeger, PA-C  ?   ? ?Allergies    ?Lisinopril and Sulfa antibiotics   ? ?Review of Systems   ?Review of Systems  ?Constitutional:  Negative for fever.  ?Respiratory:  Positive for cough.   ? ?Physical Exam ?Updated Vital Signs ?BP 126/74 (BP Location: Right Arm)   Pulse (!) 105   Temp 98.1 ?F (36.7 ?C) (Oral)   Resp 20   Ht 1.829 m (6')   Wt 119.7 kg   SpO2 98%   BMI 35.80 kg/m?  ?Physical Exam ?Vitals and nursing note reviewed.  ?Constitutional:   ?   General: He is not in acute distress. ?   Appearance: He is well-developed.  ?HENT:  ?   Head: Normocephalic and atraumatic.  ?   Right Ear: External ear  normal.  ?   Left Ear: External ear normal.  ?Eyes:  ?   General: No scleral icterus.    ?   Right eye: No discharge.     ?   Left eye: No discharge.  ?   Conjunctiva/sclera: Conjunctivae normal.  ?Neck:  ?   Trachea: No tracheal deviation.  ?Cardiovascular:  ?   Rate and Rhythm: Normal rate and regular rhythm.  ?Pulmonary:  ?   Effort: Pulmonary effort is normal. No respiratory distress.  ?   Breath sounds: Normal breath sounds. No stridor. No wheezing or rales.  ?Abdominal:  ?   General: Bowel sounds are normal. There is no distension.  ?   Palpations: Abdomen is soft.  ?   Tenderness: There is no abdominal tenderness. There is no guarding or rebound.  ?Musculoskeletal:     ?   General: No tenderness or deformity.  ?   Cervical back: Neck  supple.  ?Skin: ?   General: Skin is warm and dry.  ?   Findings: No rash.  ?Neurological:  ?   General: No focal deficit present.  ?   Mental Status: He is alert.  ?   Cranial Nerves: No cranial nerve deficit (no facial droop, extraocular movements intact, no slurred speech).  ?   Sensory: No sensory deficit.  ?   Motor: No abnormal muscle tone or seizure activity.  ?   Coordination: Coordination normal.  ?Psychiatric:     ?   Mood and Affect: Mood normal.  ? ? ?ED Results / Procedures / Treatments   ?Labs ?(all labs ordered are listed, but only abnormal results are displayed) ?Labs Reviewed - No data to display ? ?EKG ?None ? ?Radiology ?DG Chest 2 View ? ?Result Date: 04/15/2021 ?CLINICAL DATA:  Cough and sore throat for 4 months. EXAM: CHEST - 2 VIEW COMPARISON:  03/28/2021 FINDINGS: The cardiomediastinal silhouette is unremarkable. Mild peribronchial thickening is unchanged. There is no evidence of focal airspace disease, pulmonary edema, suspicious pulmonary nodule/mass, pleural effusion, or pneumothorax. No acute bony abnormalities are identified. IMPRESSION: No active cardiopulmonary disease. Electronically Signed   By: Margarette Canada M.D.   On: 04/15/2021 15:55   ? ?Procedures ?Procedures  ? ? ?Medications Ordered in ED ?Medications - No data to display ? ?ED Course/ Medical Decision Making/ A&P ?  ?                        ?Medical Decision Making ?Amount and/or Complexity of Data Reviewed ?Radiology: ordered. ? ?Risk ?OTC drugs. ?Prescription drug management. ? ? ?Patient presented to the ED for evaluation persistent cough.  ED work-up reassuring.  Images and report reviewed.  No evidence of pneumonia or nodule.  Doubt infectious etiology with the chronicity.  Discussed possibilities of acid reflux versus allergies.  Patient does think he could have a component of acid reflux.  We will try antacids as well as sterile nasal [place] and histamines.  Recommend outpatient follow-up with PCP. ? ? ? ? ? ? ? ?Final  Clinical Impression(s) / ED Diagnoses ?Final diagnoses:  ?Chronic cough  ? ? ?Rx / DC Orders ?ED Discharge Orders   ? ?      Ordered  ?  pantoprazole (PROTONIX) 20 MG tablet  2 times daily       ? 04/15/21 1706  ?  fluticasone (FLONASE) 50 MCG/ACT nasal spray  Daily       ? 04/15/21 1706  ?  loratadine (CLARITIN) 10 MG tablet  Daily       ? 04/15/21 1706  ? ?  ?  ? ?  ? ? ?  ?Dorie Rank, MD ?04/15/21 1708 ? ?

## 2021-05-04 ENCOUNTER — Ambulatory Visit (INDEPENDENT_AMBULATORY_CARE_PROVIDER_SITE_OTHER): Payer: 59 | Admitting: Family Medicine

## 2021-05-04 ENCOUNTER — Encounter: Payer: Self-pay | Admitting: Family Medicine

## 2021-05-04 VITALS — BP 119/81 | HR 80 | Temp 98.1°F | Ht 72.0 in | Wt 313.4 lb

## 2021-05-04 DIAGNOSIS — R5381 Other malaise: Secondary | ICD-10-CM | POA: Diagnosis not present

## 2021-05-04 DIAGNOSIS — H10022 Other mucopurulent conjunctivitis, left eye: Secondary | ICD-10-CM

## 2021-05-04 DIAGNOSIS — R0683 Snoring: Secondary | ICD-10-CM | POA: Diagnosis not present

## 2021-05-04 DIAGNOSIS — R5382 Chronic fatigue, unspecified: Secondary | ICD-10-CM

## 2021-05-04 MED ORDER — POLYMYXIN B-TRIMETHOPRIM 10000-0.1 UNIT/ML-% OP SOLN: 2.0000 [drp] | OPHTHALMIC | 0 refills | Status: DC

## 2021-05-04 NOTE — Progress Notes (Signed)
?  ? ?Subjective:  ?Patient ID: Logan Dawson, male    DOB: 1995-10-21, 26 y.o.   MRN: [id number] ? ?Patient Care Team: ?Baruch Gouty, FNP as PCP - General (Family Medicine)  ? ?Chief Complaint:  Conjunctivitis (Left eye itchy and runny) ? ? ?HPI: ?Logan Dawson is a 26 y.o. male presenting on 05/04/2021 for Conjunctivitis (Left eye itchy and runny) ? ? ?Pt presents with erythema to the left eye with drainage beginning this morning. His daughter, nephew, and brother have been treated for pink eye recently.  ? ?Conjunctivitis  ?The current episode started today. The onset was sudden. Nothing relieves the symptoms. Associated symptoms include eye itching, eye discharge and eye redness. Pertinent negatives include no fever, no photophobia, no headaches, no cough and no eye pain. The eye pain is mild. The left eye is affected.  ? ?Pt also complains of ongoing fatigue and malaise despite how much sleep he gets. States his family reports he snores loudly.  ? ?Relevant past medical, surgical, family, and social history reviewed and updated as indicated.  ?Allergies and medications reviewed and updated. Data reviewed: Chart in Epic. ? ? ?Past Medical History:  ?Diagnosis Date  ? ADHD (attention deficit hyperactivity disorder)   ? Asthma   ? Gastroenteritis   ? Hypertension   ? Left shoulder pain   ? Meningitis   ? Skull fracture ([place])   ? Subarachnoid hemorrhage ([place])   ? ? ?Past Surgical History:  ?Procedure Laterality Date  ? APPENDECTOMY    ? BIOPSY  08/16/2020  ? Procedure: BIOPSY;  Surgeon: Harvel Quale, MD;  Location: [facility];  Service: Gastroenterology;;  ? CHOLECYSTECTOMY    ? ESOPHAGOGASTRODUODENOSCOPY (EGD) WITH PROPOFOL N/A 08/16/2020  ? Castaneda, H pylori positive  ? HERNIA REPAIR    ? ? ?Social History  ? ?Socioeconomic History  ? Marital status: Single  ?  Spouse name: Not on file  ? Number of children: Not on file  ? Years of education: Not on file  ? Highest education level: Not on file   ?Occupational History  ? Not on file  ?Tobacco Use  ? Smoking status: Never  ? Smokeless tobacco: Never  ?Vaping Use  ? Vaping Use: Never used  ?Substance and Sexual Activity  ? Alcohol use: Not Currently  ? Drug use: No  ? Sexual activity: Not Currently  ?Other Topics Concern  ? Not on file  ?Social History Narrative  ? Not on file  ? ?Social Determinants of Health  ? ?Financial Resource Strain: Not on file  ?Food Insecurity: Not on file  ?Transportation Needs: Not on file  ?Physical Activity: Not on file  ?Stress: Not on file  ?Social Connections: Not on file  ?Intimate Partner Violence: Not on file  ? ? ?Outpatient Encounter Medications as of 05/04/2021  ?Medication Sig  ? albuterol (VENTOLIN HFA) 108 (90 Base) MCG/ACT inhaler Inhale 1 puff into the lungs every 4 (four) hours as needed for wheezing or shortness of breath (cough).  ? chlorpheniramine-HYDROcodone 10-8 MG/5ML Take by mouth.  ? FLUoxetine (PROZAC) 20 MG capsule Take 1 capsule (20 mg total) by mouth daily.  ? fluticasone (FLONASE) 50 MCG/ACT nasal spray Place 1 spray into both nostrils daily.  ? loratadine (CLARITIN) 10 MG tablet Take 1 tablet (10 mg total) by mouth daily.  ? metoprolol succinate (TOPROL-XL) 50 MG 24 hr tablet Take 2 tablets (100 mg total) by mouth daily. Take with or immediately following a meal.  ?  orlistat (ALLI) 60 MG capsule Take 1 capsule (60 mg total) by mouth 3 (three) times daily with meals.  ? pantoprazole (PROTONIX) 20 MG tablet Take 1 tablet (20 mg total) by mouth 2 (two) times daily.  ? predniSONE (DELTASONE) 20 MG tablet 3 po daily for 4 days, then 2 po daily for 4 days, then 1 po daily for 4 days  ? trimethoprim-polymyxin b (POLYTRIM) ophthalmic solution Place 2 drops into both eyes every 4 (four) hours.  ? [DISCONTINUED] dicyclomine (BENTYL) 20 MG tablet Take 1 tablet (20 mg total) by mouth every 8 (eight) hours as needed for spasms.  ? ?No facility-administered encounter medications on file as of 05/04/2021.   ? ? ?Allergies  ?Allergen Reactions  ? Lisinopril Other (See Comments)  ?  Unknown reaction ?Unknown reaction ?Other reaction(s): elevated BP ?Unknown reaction ? ?Other reaction(s): elevated BP  ? Sulfa Antibiotics Hives  ? ? ?Review of Systems  ?Constitutional:  Positive for activity change and fatigue. Negative for appetite change, chills, diaphoresis, fever and unexpected weight change.  ?Eyes:  Positive for discharge, redness and itching. Negative for photophobia, pain and visual disturbance.  ?Respiratory:  Negative for cough and shortness of breath.   ?     Loud snoring  ?Cardiovascular:  Negative for chest pain, palpitations and leg swelling.  ?Genitourinary:  Negative for decreased urine volume and difficulty urinating.  ?Neurological:  Negative for dizziness, weakness and headaches.  ?All other systems reviewed and are negative. ? ?   ? ?Objective:  ?BP 119/81   Pulse 80   Temp 98.1 ?F (36.7 ?C)   Ht 6' (1.829 m)   Wt (!) 142.2 kg   SpO2 97%   BMI 42.50 kg/m?   ? ?Wt Readings from Last 3 Encounters:  ?05/04/21 (!) 142.2 kg  ?04/15/21 119.7 kg  ?03/21/21 (!) 137.4 kg  ? ? ?Physical Exam ?Vitals and nursing note reviewed.  ?Constitutional:   ?   Appearance: Normal appearance. He is obese.  ?HENT:  ?   Head: Normocephalic and atraumatic.  ?Eyes:  ?   General:     ?   Left eye: Discharge present. ?   Conjunctiva/sclera:  ?   Right eye: Right conjunctiva is injected.  ?   Left eye: Left conjunctiva is injected.  ?   Pupils: Pupils are equal, round, and reactive to light.  ?Cardiovascular:  ?   Rate and Rhythm: Normal rate and regular rhythm.  ?   Pulses: Normal pulses.  ?   Heart sounds: Normal heart sounds.  ?Pulmonary:  ?   Effort: Pulmonary effort is normal.  ?   Breath sounds: Normal breath sounds.  ?Musculoskeletal:     ?   General: Normal range of motion.  ?   Cervical back: Normal range of motion.  ?Skin: ?   General: Skin is warm and dry.  ?   Capillary Refill: Capillary refill takes less than 2  seconds.  ?Neurological:  ?   General: No focal deficit present.  ?   Mental Status: He is alert and oriented to person, place, and time. Mental status is at baseline.  ?Psychiatric:     ?   Mood and Affect: Mood normal.     ?   Behavior: Behavior normal.     ?   Thought Content: Thought content normal.     ?   Judgment: Judgment normal.  ? ? ?Results for orders placed or performed in visit on 03/16/21  ?H. pylori breath  test  ?Result Value Ref Range  ? H. pylori Breath Test NOT DETECTED NOT DETECTED  ? ?   ? ?Pertinent labs & imaging results that were available during my care of the patient were reviewed by me and considered in my medical decision making. ? ?Assessment & Plan:  ?Pau was seen today for conjunctivitis. ? ?Diagnoses and all orders for this visit: ? ?Pink eye disease of left eye ?Avel was seen today for conjunctivitis. ? ?Diagnoses and all orders for this visit: ? ?Pink eye disease of left eye ?-     trimethoprim-polymyxin b (POLYTRIM) ophthalmic solution; Place 2 drops into both eyes every 4 (four) hours. ?- Instructed patient to avoid touching eyes, maintaining clean eyes, use of eye drops for 2 days after relief of symptoms. He may return work 24 hours after beginning eye drops.  ? ?Loud snoring ?Chronic fatigue and malaise ?Will send for sleep apnea testing.  ? ?Continue all other maintenance medications. ? ?Follow up plan: ?Return if symptoms worsen or fail to improve. ? ? ?Continue healthy lifestyle choices, including diet (rich in fruits, vegetables, and lean proteins, and low in salt and simple carbohydrates) and exercise (at least 30 minutes of moderate physical activity daily). ? ?Educational handout given for conjunctivitis  ? ?The above assessment and management plan was discussed with the patient. The patient verbalized understanding of and has agreed to the management plan. Patient is aware to call the clinic if they develop any new symptoms or if symptoms persist or worsen. Patient is  aware when to return to the clinic for a follow-up visit. Patient educated on when it is appropriate to go to the emergency department.  ? ?Addison Amerika Nourse, NP-S ? ?I personally was present during the history, physical

## 2021-05-09 ENCOUNTER — Encounter (HOSPITAL_COMMUNITY): Payer: Self-pay | Admitting: *Deleted

## 2021-05-09 ENCOUNTER — Emergency Department (HOSPITAL_COMMUNITY): Payer: 59

## 2021-05-09 ENCOUNTER — Emergency Department (HOSPITAL_COMMUNITY)
Admission: EM | Admit: 2021-05-09 | Discharge: 2021-05-09 | Disposition: A | Discharge status: Home / Self Care | Payer: 59 | Attending: Emergency Medicine | Admitting: Emergency Medicine

## 2021-05-09 DIAGNOSIS — R051 Acute cough: Secondary | ICD-10-CM

## 2021-05-09 DIAGNOSIS — J45909 Unspecified asthma, uncomplicated: Secondary | ICD-10-CM | POA: Diagnosis not present

## 2021-05-09 DIAGNOSIS — R059 Cough, unspecified: Secondary | ICD-10-CM | POA: Diagnosis present

## 2021-05-09 MED ORDER — PREDNISONE 50 MG PO TABS: ORAL_TABLET | ORAL | 0 refills | Status: DC

## 2021-05-09 MED ORDER — ALBUTEROL SULFATE (2.5 MG/3ML) 0.083% IN NEBU
2.5000 mg | INHALATION_SOLUTION | Freq: Once | RESPIRATORY_TRACT | Status: AC
  Administered 2021-05-09: 2.5 mg via RESPIRATORY_TRACT
  Filled 2021-05-09: qty 3

## 2021-05-09 NOTE — ED Notes (Signed)
EKG was done in triage @15 .21 ?

## 2021-05-09 NOTE — ED Provider Notes (Signed)
?[facility] ?Provider Note ? ? ?CSN: [id number] ?Arrival date & time: 05/09/21  1503 ? ?  ? ?History ? ?Chief Complaint  ?Patient presents with  ? Cough  ? ? ?Logan Dawson is a 26 y.o. male. ? ?Pt complains of a cough for over 2 months.  Patient reports he has been on antibiotics prednisone and has been using his albuterol inhaler however cough has continued.  Patient reports he has been on a codeine cough syrup and a hydrocodone cough syrup.  Patient reports he has had episodes where he awakes from sleep choking and cannot breathe.  Patient's primary care doctor is in the process of scheduling him for a sleep apnea study.  Patient is also currently being treated for reflux.  States he does sleep with his head elevated.  Patient denies any fever or chills he is not having any chest pain ? ?The history is provided by the patient. No language interpreter was used.  ?Cough ? ?  ? ?Home Medications ?Prior to Admission medications   ?Medication Sig Start Date End Date Taking? Authorizing Provider  ?albuterol (VENTOLIN HFA) 108 (90 Base) MCG/ACT inhaler Inhale 1 puff into the lungs every 4 (four) hours as needed for wheezing or shortness of breath (cough). 08/12/19   Johnson, Clanford L, MD  ?FLUoxetine (PROZAC) 20 MG capsule Take 1 capsule (20 mg total) by mouth daily. 03/07/21   Baruch Gouty, FNP  ?fluticasone (FLONASE) 50 MCG/ACT nasal spray Place 1 spray into both nostrils daily. 04/15/21   Dorie Rank, MD  ?loratadine (CLARITIN) 10 MG tablet Take 1 tablet (10 mg total) by mouth daily. 04/15/21 05/15/21  Dorie Rank, MD  ?metoprolol succinate (TOPROL-XL) 50 MG 24 hr tablet Take 2 tablets (100 mg total) by mouth daily. Take with or immediately following a meal. 03/07/21   Rakes, Connye Burkitt, FNP  ?orlistat (ALLI) 60 MG capsule Take 1 capsule (60 mg total) by mouth 3 (three) times daily with meals. 03/21/21 06/19/21  Baruch Gouty, FNP  ?pantoprazole (PROTONIX) 20 MG tablet Take 1 tablet (20 mg total) by mouth  2 (two) times daily. 04/15/21 05/15/21  Dorie Rank, MD  ?predniSONE (DELTASONE) 20 MG tablet 3 po daily for 4 days, then 2 po daily for 4 days, then 1 po daily for 4 days 05/03/21   [provider]  ?trimethoprim-polymyxin b (POLYTRIM) ophthalmic solution Place 2 drops into both eyes every 4 (four) hours. 05/04/21   Baruch Gouty, FNP  ?dicyclomine (BENTYL) 20 MG tablet Take 1 tablet (20 mg total) by mouth every 8 (eight) hours as needed for spasms. 12/15/18 06/07/19  Petrucelli, Glynda Jaeger, PA-C  ?   ? ?Allergies    ?Lisinopril and Sulfa antibiotics   ? ?Review of Systems   ?Review of Systems  ?Respiratory:  Positive for cough.   ?All other systems reviewed and are negative. ? ?Physical Exam ?Updated Vital Signs ?BP 106/62   Pulse (!) 120   Temp (!) 97.5 ?F (36.4 ?C) (Oral)   Resp (!) 21   SpO2 98%  ?Physical Exam ?Vitals and nursing note reviewed.  ?Constitutional:   ?   General: He is not in acute distress. ?   Appearance: He is well-developed.  ?HENT:  ?   Head: Normocephalic and atraumatic.  ?   Mouth/Throat:  ?   Mouth: Mucous membranes are moist.  ?Eyes:  ?   Conjunctiva/sclera: Conjunctivae normal.  ?Cardiovascular:  ?   Rate and Rhythm: Normal rate and  regular rhythm.  ?   Heart sounds: No murmur heard. ?Pulmonary:  ?   Effort: Pulmonary effort is normal. No respiratory distress.  ?   Breath sounds: Normal breath sounds.  ?Abdominal:  ?   Palpations: Abdomen is soft.  ?Musculoskeletal:     ?   General: No swelling.  ?   Cervical back: Neck supple.  ?Skin: ?   General: Skin is warm and dry.  ?   Capillary Refill: Capillary refill takes less than 2 seconds.  ?Neurological:  ?   General: No focal deficit present.  ?   Mental Status: He is alert.  ?Psychiatric:     ?   Mood and Affect: Mood normal.  ? ? ?ED Results / Procedures / Treatments   ?Labs ?(all labs ordered are listed, but only abnormal results are displayed) ?Labs Reviewed - No data to display ? ?EKG ?None ? ?Radiology ?DG Chest 2  View ? ?Result Date: 05/09/2021 ?CLINICAL DATA:  A 26 year old male presents for evaluation of shortness of breath and cough. EXAM: CHEST - 2 VIEW COMPARISON:  May 02, 2021. FINDINGS: Trachea midline. Cardiomediastinal contours and hilar structures are normal. Lungs are clear.  No pneumothorax.  No pleural effusion. On limited assessment there is no acute skeletal process. IMPRESSION: No acute cardiopulmonary disease. Electronically Signed   By: Zetta Bills M.D.   On: 05/09/2021 15:42   ? ?Procedures ?Procedures  ? ? ?Medications Ordered in ED ?Medications  ?albuterol (PROVENTIL) (2.5 MG/3ML) 0.083% nebulizer solution 2.5 mg (2.5 mg Nebulization Given 05/09/21 1945)  ? ? ?ED Course/ Medical Decision Making/ A&P ?  ?                        ?Medical Decision Making ?Patient complains of cough for over 2 months ? ?Problems Addressed: ?Moderate asthma without complication, unspecified whether persistent: acute illness or injury ?   Details: Patient has a history of asthma he is using an albuterol inhaler ? ?Amount and/or Complexity of Data Reviewed ?External Data Reviewed: notes. ?   Details: Primary care provider's notes reviewed patient had a negative H. pylori test last week. ?Radiology: ordered and independent interpretation performed. Decision-making details documented in ED Course. ?   Details: Chest x-ray ordered reviewed and interpreted no acute process by radiologist ? ?Risk ?Prescription drug management. ?Risk Details: Is given an albuterol treatment at his request I will start him on prednisone patient is given 60 mg here he is advised to follow-up with his primary care physician for sleep studies.  I suspect patient has a combination of sleep apnea and reflux which are contributing to his episodes of awakening with feeling that he cannot breathe as well as his underlying asthma.  Patient is stable at this time for discharge his oxygen saturations are 98% ? ? ? ? ? ? ? ? ? ? ?Final Clinical Impression(s) /  ED Diagnoses ?Final diagnoses:  ?Acute cough  ?Moderate asthma without complication, unspecified whether persistent  ? ? ?Rx / DC Orders ?ED Discharge Orders   ? ?      Ordered  ?  predniSONE (DELTASONE) 50 MG tablet       ? 05/09/21 2043  ? ?  ?  ? ?  ?An After Visit Summary was printed and given to the patient. ? ? ?  ?Fransico Meadow, [place] ?05/09/21 2055 ? ?  ?Margette Fast, MD ?05/10/21 1210 ? ?

## 2021-05-09 NOTE — ED Triage Notes (Signed)
Recurrent cough

## 2021-05-16 ENCOUNTER — Telehealth: Payer: Self-pay | Admitting: Urology

## 2021-05-16 ENCOUNTER — Telehealth: Payer: Self-pay | Admitting: Family Medicine

## 2021-05-16 ENCOUNTER — Other Ambulatory Visit (INDEPENDENT_AMBULATORY_CARE_PROVIDER_SITE_OTHER): Payer: Self-pay | Admitting: Gastroenterology

## 2021-05-16 ENCOUNTER — Telehealth (INDEPENDENT_AMBULATORY_CARE_PROVIDER_SITE_OTHER): Payer: Self-pay

## 2021-05-16 ENCOUNTER — Other Ambulatory Visit: Payer: Self-pay | Admitting: Family Medicine

## 2021-05-16 DIAGNOSIS — K9089 Other intestinal malabsorption: Secondary | ICD-10-CM

## 2021-05-16 DIAGNOSIS — Z7689 Persons encountering health services in other specified circumstances: Secondary | ICD-10-CM

## 2021-05-16 MED ORDER — OZEMPIC (0.25 OR 0.5 MG/DOSE) 2 MG/1.5ML ~~LOC~~ SOPN: 0.5000 mg | PEN_INJECTOR | SUBCUTANEOUS | 0 refills | Status: DC

## 2021-05-16 MED ORDER — COLESTIPOL HCL 1 G PO TABS: 2.0000 g | ORAL_TABLET | Freq: Every day | ORAL | 0 refills | Status: DC

## 2021-05-16 NOTE — Telephone Encounter (Signed)
Colestid 2 g qday sent ?

## 2021-05-16 NOTE — Telephone Encounter (Signed)
Patient states he is not taking the Orlistat. ?

## 2021-05-16 NOTE — Telephone Encounter (Signed)
Patient informed. Appt made for 06/15/21 ?

## 2021-05-16 NOTE — Telephone Encounter (Signed)
Patient said that he has spoke with Rakes about a weight loss medication before but the one she called in was too expensive. He spoke with his insurance and they will pay for Ozempic so he would like to know if Rakes would call it in for him. Please call back and advise.  ?

## 2021-05-16 NOTE — Telephone Encounter (Signed)
Patient aware of all.

## 2021-05-16 NOTE — Telephone Encounter (Signed)
Is he taking the Colestid that was prescribed in the past? Would be important for him to make an appointment to follow huis symptoms. Can restart the Colestid if he is not currently taking it.  ?May want to consider colonoscopy if his symptoms do not improve. ?

## 2021-05-16 NOTE — Telephone Encounter (Signed)
Notified patient as instructed, Patient is going to come by the office to pick up a cup and than schedule drop off.  ? ?

## 2021-05-16 NOTE — Telephone Encounter (Signed)
The medication I sent in was [facility] which is another name for ozempic. ?

## 2021-05-16 NOTE — Telephone Encounter (Signed)
1 year out from vasectomy and never had postvasectomy semen check.  Please let him know he is not considered sterile until a semen sample is examined by the lab.  Recommend rescheduling ?

## 2021-05-16 NOTE — Telephone Encounter (Signed)
Patient states he is out of the Colestid please send in a new rx to [facility]. The patient is aware he has an appointment on Monday 05/22/2021 at 11 am. Mitzie aware to place on the Schedule.  ?

## 2021-05-16 NOTE — Telephone Encounter (Signed)
The insurance states they will only pay for Brand name Ozempic  ?

## 2021-05-16 NOTE — Telephone Encounter (Signed)
Is he taking Orlistat? That can cause him to have diarrhea with each meal. Please confirm with him if he is taking this before I send any prescription. ?Thanks ?

## 2021-05-16 NOTE — Telephone Encounter (Signed)
Patient called today stating diarrhea has returned. He says for the last few days he has had 6-8 watery stools per day. He says mostly starts after first meal of the day, then continues throughout the day. He has not been taking anything for it. He denies any abdominal pain,fever,dark or bloody stools,no mucus. Please advise. Thanks ?

## 2021-05-19 ENCOUNTER — Ambulatory Visit: Payer: 59 | Admitting: Family Medicine

## 2021-05-22 ENCOUNTER — Encounter: Payer: Self-pay | Admitting: Family Medicine

## 2021-05-22 ENCOUNTER — Ambulatory Visit (INDEPENDENT_AMBULATORY_CARE_PROVIDER_SITE_OTHER): Payer: 59 | Admitting: Gastroenterology

## 2021-05-23 ENCOUNTER — Ambulatory Visit: Payer: 59 | Admitting: Family Medicine

## 2021-05-25 ENCOUNTER — Encounter: Payer: Self-pay | Admitting: Family Medicine

## 2021-05-25 ENCOUNTER — Ambulatory Visit (INDEPENDENT_AMBULATORY_CARE_PROVIDER_SITE_OTHER): Payer: 59 | Admitting: Family Medicine

## 2021-05-25 VITALS — BP 116/73 | HR 97 | Temp 98.1°F | Ht 72.0 in | Wt 308.0 lb

## 2021-05-25 DIAGNOSIS — K432 Incisional hernia without obstruction or gangrene: Secondary | ICD-10-CM | POA: Diagnosis not present

## 2021-05-25 NOTE — Progress Notes (Signed)
?  ? ?Subjective:  ?Patient ID: Logan Dawson, male    DOB: 09/05/1995, 26 y.o.   MRN: [id number] ? ?Patient Care Team: ?Sonny Mastersakes, Yarden Manuelito M, FNP as PCP - General (Family Medicine)  ? ?Chief Complaint:  referral for hernia ? ? ?HPI: ?Logan MowJacob C Putt is a 26 y.o. male presenting on 05/25/2021 for referral for hernia ? ? ?Pt presents today to get a referral to see a general surgeon for a hernia. He was seen in the ED  05/19/2021 for abdominal pain. CT scan was unremarkable, no hernia identified but changes from umbilical hernia repair noted. Pt states he has intermittent pain at prior incision site with noted protruding at times. No nausea, vomiting, or constipation. No fever, chills, weakness, confusion, or decreased urine output. He does have a follow up with GI scheduled.  ? ?Relevant past medical, surgical, family, and social history reviewed and updated as indicated.  ?Allergies and medications reviewed and updated. Data reviewed: Chart in Epic. ? ? ?Past Medical History:  ?Diagnosis Date  ? ADHD (attention deficit hyperactivity disorder)   ? Asthma   ? Gastroenteritis   ? Hypertension   ? Left shoulder pain   ? Meningitis   ? Skull fracture (HCC)   ? Subarachnoid hemorrhage (HCC)   ? ? ?Past Surgical History:  ?Procedure Laterality Date  ? APPENDECTOMY    ? BIOPSY  08/16/2020  ? Procedure: BIOPSY;  Surgeon: Dolores Frameastaneda Mayorga, Daniel, MD;  Location: [facility];  Service: Gastroenterology;;  ? CHOLECYSTECTOMY    ? ESOPHAGOGASTRODUODENOSCOPY (EGD) WITH PROPOFOL N/A 08/16/2020  ? Castaneda, H pylori positive  ? HERNIA REPAIR    ? ? ?Social History  ? ?Socioeconomic History  ? Marital status: Single  ?  Spouse name: Not on file  ? Number of children: Not on file  ? Years of education: Not on file  ? Highest education level: Not on file  ?Occupational History  ? Not on file  ?Tobacco Use  ? Smoking status: Never  ? Smokeless tobacco: Never  ?Vaping Use  ? Vaping Use: Never used  ?Substance and Sexual Activity  ? Alcohol  use: Not Currently  ? Drug use: No  ? Sexual activity: Not Currently  ?Other Topics Concern  ? Not on file  ?Social History Narrative  ? Not on file  ? ?Social Determinants of Health  ? ?Financial Resource Strain: Not on file  ?Food Insecurity: Not on file  ?Transportation Needs: Not on file  ?Physical Activity: Not on file  ?Stress: Not on file  ?Social Connections: Not on file  ?Intimate Partner Violence: Not on file  ? ? ?Outpatient Encounter Medications as of 05/25/2021  ?Medication Sig  ? albuterol (VENTOLIN HFA) 108 (90 Base) MCG/ACT inhaler Inhale 1 puff into the lungs every 4 (four) hours as needed for wheezing or shortness of breath (cough).  ? colestipol (COLESTID) 1 g tablet Take 2 tablets (2 g total) by mouth daily.  ? dicyclomine (BENTYL) 20 MG tablet Take 20 mg by mouth 2 (two) times daily.  ? FLUoxetine (PROZAC) 20 MG capsule Take 1 capsule (20 mg total) by mouth daily.  ? fluticasone (FLONASE) 50 MCG/ACT nasal spray Place 1 spray into both nostrils daily.  ? loratadine (CLARITIN) 10 MG tablet Take 1 tablet (10 mg total) by mouth daily.  ? metoprolol succinate (TOPROL-XL) 50 MG 24 hr tablet Take 2 tablets (100 mg total) by mouth daily. Take with or immediately following a meal.  ? pantoprazole (PROTONIX) 20  MG tablet Take 1 tablet (20 mg total) by mouth 2 (two) times daily.  ? Semaglutide,0.25 or 0.5MG /DOS, (OZEMPIC, 0.25 OR 0.5 MG/DOSE,) 2 MG/1.5ML SOPN Inject 0.5 mg into the skin once a week for 4 doses.  ? trimethoprim-polymyxin b (POLYTRIM) ophthalmic solution Place 2 drops into both eyes every 4 (four) hours.  ? [DISCONTINUED] dicyclomine (BENTYL) 20 MG tablet Take 1 tablet (20 mg total) by mouth every 8 (eight) hours as needed for spasms.  ? [DISCONTINUED] predniSONE (DELTASONE) 50 MG tablet One tablet a day  ? ?No facility-administered encounter medications on file as of 05/25/2021.  ? ? ?Allergies  ?Allergen Reactions  ? Lisinopril Other (See Comments)  ?  Unknown reaction ?Unknown  reaction ?Other reaction(s): elevated BP ?Unknown reaction ? ?Other reaction(s): elevated BP  ? Sulfa Antibiotics Hives  ? ? ?Review of Systems  ?Constitutional:  Negative for activity change, appetite change, chills, diaphoresis, fatigue, fever and unexpected weight change.  ?HENT: Negative.    ?Eyes: Negative.   ?Respiratory:  Negative for cough, chest tightness and shortness of breath.   ?Cardiovascular:  Negative for chest pain, palpitations and leg swelling.  ?Gastrointestinal:  Positive for abdominal pain and diarrhea. Negative for abdominal distention, blood in stool, constipation, nausea, rectal pain and vomiting.  ?Endocrine: Negative.   ?Genitourinary:  Negative for decreased urine volume, difficulty urinating, dysuria, frequency and urgency.  ?Musculoskeletal:  Negative for arthralgias and myalgias.  ?Skin: Negative.   ?Allergic/Immunologic: Negative.   ?Neurological:  Negative for dizziness and headaches.  ?Hematological: Negative.   ?Psychiatric/Behavioral:  Negative for confusion, hallucinations, sleep disturbance and suicidal ideas.   ?All other systems reviewed and are negative. ? ?   ? ?Objective:  ?BP 116/73   Pulse 97   Temp 98.1 ?F (36.7 ?C)   Ht 6' (1.829 m)   Wt (!) 308 lb (139.7 kg)   SpO2 95%   BMI 41.77 kg/m?   ? ?Wt Readings from Last 3 Encounters:  ?05/25/21 (!) 308 lb (139.7 kg)  ?05/04/21 (!) 313 lb 6.4 oz (142.2 kg)  ?04/15/21 264 lb (119.7 kg)  ? ? ?Physical Exam ?Vitals and nursing note reviewed.  ?Constitutional:   ?   General: He is not in acute distress. ?   Appearance: He is obese. He is not ill-appearing, toxic-appearing or diaphoretic.  ?HENT:  ?   Head: Normocephalic and atraumatic.  ?   Mouth/Throat:  ?   Mouth: Mucous membranes are moist.  ?Eyes:  ?   Pupils: Pupils are equal, round, and reactive to light.  ?Cardiovascular:  ?   Rate and Rhythm: Normal rate and regular rhythm.  ?   Heart sounds: Normal heart sounds.  ?Pulmonary:  ?   Effort: Pulmonary effort is normal.   ?   Breath sounds: Normal breath sounds.  ?Abdominal:  ?   General: Abdomen is protuberant.  ?   Palpations: Abdomen is soft.  ?   Tenderness: There is abdominal tenderness.  ?   Hernia: A hernia is present. Hernia is present in the ventral area.  ? ? ?Skin: ?   General: Skin is warm and dry.  ?   Capillary Refill: Capillary refill takes less than 2 seconds.  ?Neurological:  ?   General: No focal deficit present.  ?   Mental Status: He is alert and oriented to person, place, and time.  ?Psychiatric:     ?   Mood and Affect: Mood normal.     ?   Behavior: Behavior normal.     ?  Thought Content: Thought content normal.     ?   Judgment: Judgment normal.  ? ? ?Results for orders placed or performed in visit on 03/16/21  ?H. pylori breath test  ?Result Value Ref Range  ? H. pylori Breath Test NOT DETECTED NOT DETECTED  ? ?   ? ?Pertinent labs & imaging results that were available during my care of the patient were reviewed by me and considered in my medical decision making. ? ?Assessment & Plan:  ?[place] was seen today for referral for hernia. ? ?Diagnoses and all orders for this visit: ? ?Incisional hernia, without obstruction or gangrene ?Referral to general surgery placed. Hernia soft and reducible. Discussed care at home and red flags which require emergent evaluation and treatment.  ?-     Ambulatory referral to General Surgery ? ?  ? ?Continue all other maintenance medications. ? ?Follow up plan: ?Return if symptoms worsen or fail to improve. ? ? ?Continue healthy lifestyle choices, including diet (rich in fruits, vegetables, and lean proteins, and low in salt and simple carbohydrates) and exercise (at least 30 minutes of moderate physical activity daily). ? ?Educational handout given for hernia ? ?The above assessment and management plan was discussed with the patient. The patient verbalized understanding of and has agreed to the management plan. Patient is aware to call the clinic if they develop any new  symptoms or if symptoms persist or worsen. Patient is aware when to return to the clinic for a follow-up visit. Patient educated on when it is appropriate to go to the emergency department.  ? ?Kari Baars, FNP-C ?[facility]

## 2021-05-30 ENCOUNTER — Encounter: Payer: Self-pay | Admitting: Neurology

## 2021-05-30 ENCOUNTER — Ambulatory Visit: Payer: 59 | Admitting: Neurology

## 2021-05-30 VITALS — BP 123/80 | HR 103 | Ht 72.0 in | Wt 315.0 lb

## 2021-05-30 DIAGNOSIS — R0681 Apnea, not elsewhere classified: Secondary | ICD-10-CM | POA: Diagnosis not present

## 2021-05-30 DIAGNOSIS — G4719 Other hypersomnia: Secondary | ICD-10-CM | POA: Diagnosis not present

## 2021-05-30 DIAGNOSIS — R0683 Snoring: Secondary | ICD-10-CM | POA: Diagnosis not present

## 2021-05-30 DIAGNOSIS — K219 Gastro-esophageal reflux disease without esophagitis: Secondary | ICD-10-CM

## 2021-05-30 DIAGNOSIS — R0689 Other abnormalities of breathing: Secondary | ICD-10-CM

## 2021-05-30 DIAGNOSIS — R351 Nocturia: Secondary | ICD-10-CM

## 2021-05-30 DIAGNOSIS — Z820 Family history of epilepsy and other diseases of the nervous system: Secondary | ICD-10-CM

## 2021-05-30 NOTE — Progress Notes (Signed)
Subjective:  ?  ?Patient ID: Logan Dawson is a 26 y.o. male. ? ?HPI ? ? ? ?Logan Foley, MD, PhD ?[facility] ?[address], [address] ?[phone] ?[place], [place] [address] ? ?Dear Bonita Quin,  ? ?I saw your patient, Logan Dawson, upon your kind request in my sleep clinic today for initial consultation of his sleep disorder, in particular, concern for underlying obstructive sleep apnea.  The patient is unaccompanied today.  As you know, Logan Dawson is a 26 year old right-handed gentleman with an underlying medical history of asthma, history of meningitis, history of subarachnoid hemorrhage and skull fracture in the remote past, history of hypertension, ADHD, and severe obesity with a BMI of over 40, who reports snoring and excessive daytime somnolence, as well as witnessed apneas per girlfriend.  I reviewed your office note from 05/04/2021.  His Epworth sleepiness score is 21 out of 94, fatigue severity score is 53 out of 63.  He has been sleepy during the day for the past few years.  He may sleep 8 or 10 hours but does not wake up rested.  He denies recurrent morning headaches but has woken up with a sense of gasping for air and with nighttime reflux symptoms.  He has sleep disruption from nocturia, about 5 or 6 times per average night.  He does not drink any alcohol, he is a non-smoker.  He has caffeine in the form of soda and sweet tea about 5 or 6 servings per day on average.  He lives alone, he is a 36-year-old daughter and she stays with him every other week.  He works as a Arboriculturist for [employer].  He has an uncle with sleep apnea and a cousin with sleep apnea.  He has had weight gain over time, he is currently working on weight loss, he is currently not on Ozempic.  His bedtime is generally around 8 PM and rise time between 6 and 7 AM. ? ?His Past Medical History Is Significant For: ?Past Medical History:  ?Diagnosis Date  ? ADHD (attention deficit hyperactivity disorder)    ? Anxiety   ? Asthma   ? Gastroenteritis   ? Hypertension   ? Left shoulder pain   ? Meningitis   ? Migraine   ? after skull fracture  ? Skull fracture (HCC)   ? Subarachnoid hemorrhage (HCC)   ? ? ?His Past Surgical History Is Significant For: ?Past Surgical History:  ?Procedure Laterality Date  ? APPENDECTOMY    ? BIOPSY  08/16/2020  ? Procedure: BIOPSY;  Surgeon: Dolores Frame, MD;  Location: [facility];  Service: Gastroenterology;;  ? CHOLECYSTECTOMY    ? ESOPHAGOGASTRODUODENOSCOPY (EGD) WITH PROPOFOL N/A 08/16/2020  ? Castaneda, H pylori positive  ? HERNIA REPAIR    ? ? ?His Family History Is Significant For: ?Family History  ?Problem Relation Age of Onset  ? Diabetes Mother   ? COPD Mother   ? Diabetes Father   ? Hypertension Father   ? Heart disease Father   ? ? ?His Social History Is Significant For: ?Social History  ? ?Socioeconomic History  ? Marital status: Single  ?  Spouse name: Not on file  ? Number of children: Not on file  ? Years of education: Not on file  ? Highest education level: Not on file  ?Occupational History  ? Not on file  ?Tobacco Use  ? Smoking status: Never  ? Smokeless tobacco: Never  ?Vaping Use  ? Vaping  Use: Never used  ?Substance and Sexual Activity  ? Alcohol use: Not Currently  ? Drug use: No  ? Sexual activity: Not Currently  ?Other Topics Concern  ? Not on file  ?Social History Narrative  ? Lives alone  ? Left handed  ? Caffeine: 8 sodas per day 12 oz each   ? ?Social Determinants of Health  ? ?Financial Resource Strain: Not on file  ?Food Insecurity: Not on file  ?Transportation Needs: Not on file  ?Physical Activity: Not on file  ?Stress: Not on file  ?Social Connections: Not on file  ? ? ?His Allergies Are:  ?Allergies  ?Allergen Reactions  ? Lisinopril Other (See Comments)  ?  Unknown reaction ?Unknown reaction ?Other reaction(s): elevated BP ?Unknown reaction ? ?Other reaction(s): elevated BP  ? Sulfa Antibiotics Hives  ?:  ? ?His Current Medications Are:   ?Outpatient Encounter Medications as of 05/30/2021  ?Medication Sig  ? albuterol (VENTOLIN HFA) 108 (90 Base) MCG/ACT inhaler Inhale 1 puff into the lungs every 4 (four) hours as needed for wheezing or shortness of breath (cough).  ? Famotidine (PEPCID AC PO) Take 20 mg by mouth daily.  ? FLUoxetine (PROZAC) 20 MG capsule Take 1 capsule (20 mg total) by mouth daily.  ? fluticasone (FLONASE) 50 MCG/ACT nasal spray Place 1 spray into both nostrils daily.  ? loratadine (CLARITIN) 10 MG tablet Take 1 tablet (10 mg total) by mouth daily.  ? metoprolol succinate (TOPROL-XL) 50 MG 24 hr tablet Take 2 tablets (100 mg total) by mouth daily. Take with or immediately following a meal.  ? pantoprazole (PROTONIX) 20 MG tablet Take 1 tablet (20 mg total) by mouth 2 (two) times daily.  ? colestipol (COLESTID) 1 g tablet Take 2 tablets (2 g total) by mouth daily. (Patient not taking: Reported on 05/30/2021)  ? dicyclomine (BENTYL) 20 MG tablet Take 20 mg by mouth 2 (two) times daily. (Patient not taking: Reported on 05/30/2021)  ? Semaglutide,0.25 or 0.5MG /DOS, (OZEMPIC, 0.25 OR 0.5 MG/DOSE,) 2 MG/1.5ML SOPN Inject 0.5 mg into the skin once a week for 4 doses. (Patient not taking: Reported on 05/30/2021)  ? trimethoprim-polymyxin b (POLYTRIM) ophthalmic solution Place 2 drops into both eyes every 4 (four) hours. (Patient not taking: Reported on 05/30/2021)  ? ?No facility-administered encounter medications on file as of 05/30/2021.  ?: ? ? ?Review of Systems:  ?Out of a complete 14 point review of systems, all are reviewed and negative with the exception of these symptoms as listed below: ? ?Review of Systems  ?Neurological:   ?     Patient is here alone for a sleep consult. He states for years he has had ongoing fatigue, sleepiness, no matter how much sleep he gets. He can sleep 8-10 hours at night and still be tired during the day. He states he has caught himself dozing at the wheel. He developed a cough and was seen at the hospital  and was told it was bronchitis. The cough lingered and he saw another hospital for a second opinion and was told it may be due to possible sleep apnea. He usually feels too tired to play with his daughter outside. He has never had a sleep study or been diagnosed with sleep apnea. Some morning he wakes up gasping for air and feels acid reflux "coming up". He endorses snoring. ESS 21, FSS 63.   ? ?Objective:  ?Neurological Exam ? ?Physical Exam ?Physical Examination:  ? ?Vitals:  ? 05/30/21 1402  ?BP:  123/80  ?Pulse: (!) 103  ? ? ?General Examination: The patient is a very pleasant 26 y.o. male in no acute distress. He appears well-developed and well-nourished and well groomed.  ? ?HEENT: Normocephalic, atraumatic, pupils are equal, round and reactive to light, extraocular tracking is good without limitation to gaze excursion or nystagmus noted. Hearing is grossly intact. Face is symmetric with normal facial animation. Speech is clear with no dysarthria noted. There is no hypophonia. There is no lip, neck/head, jaw or voice tremor. Neck is supple with full range of passive and active motion. There are no carotid bruits on auscultation. Oropharynx exam reveals: mild mouth dryness, adequate dental hygiene and marked airway crowding, due to small airway, thicker soft palate, tonsillar size of 2-3+ and larger uvula.  Mallampati class III.  Neck circumference of 18.8 inches.  No significant overbite noted.  Tongue protrudes centrally and palate elevates symmetrically. ? ?Chest: Clear to auscultation without wheezing, rhonchi or crackles noted. ? ?Heart: S1+S2+0, regular and normal without murmurs, rubs or gallops noted.  ? ?Abdomen: Soft, non-tender and non-distended. ? ?Extremities: There is no pitting edema in the distal lower extremities bilaterally.  ? ?Skin: Warm and dry without trophic changes noted.  ? ?Musculoskeletal: exam reveals no obvious joint deformities.  ? ?Neurologically:  ?Mental status: The patient is  awake, alert and oriented in all 4 spheres. His immediate and remote memory, attention, language skills and fund of knowledge are appropriate. There is no evidence of aphasia, agnosia, apraxia or anomia.

## 2021-05-30 NOTE — Patient Instructions (Signed)

## 2021-06-01 ENCOUNTER — Ambulatory Visit: Payer: 59 | Admitting: General Surgery

## 2021-06-01 ENCOUNTER — Other Ambulatory Visit: Payer: Self-pay

## 2021-06-01 ENCOUNTER — Encounter: Payer: Self-pay | Admitting: General Surgery

## 2021-06-01 VITALS — BP 131/79 | HR 99 | Temp 98.2°F | Resp 18 | Ht 72.0 in | Wt 318.0 lb

## 2021-06-01 DIAGNOSIS — K432 Incisional hernia without obstruction or gangrene: Secondary | ICD-10-CM

## 2021-06-01 NOTE — Progress Notes (Signed)
Logan Dawson; [id number]; 1995/03/15 ? ? ?HPI ?Patient is a 26 year old white male who was referred to my care by [name] for evaluation and treatment of the possible incisional hernia.  He had an open appendectomy in the remote past and within the last week, he developed swelling and pain at the surgical scar site.  He was seen in the emergency room at [facility].  They did note a swelling in the right lower quadrant.  A CT scan of the abdomen was negative for a hernia.  He states the pain is made worse with straining.  He is currently being worked up for sleep apnea and is to be scheduled for a sleep study.  Straining does make the pain worse.  He denies any nausea or vomiting. ?Past Medical History:  ?Diagnosis Date  ? ADHD (attention deficit hyperactivity disorder)   ? Anxiety   ? Asthma   ? Gastroenteritis   ? Hypertension   ? Left shoulder pain   ? Meningitis   ? Migraine   ? after skull fracture  ? Skull fracture (HCC)   ? Subarachnoid hemorrhage (HCC)   ? ? ?Past Surgical History:  ?Procedure Laterality Date  ? APPENDECTOMY    ? BIOPSY  08/16/2020  ? Procedure: BIOPSY;  Surgeon: Dolores Frame, MD;  [facility];  Service: Gastroenterology;;  ? CHOLECYSTECTOMY    ? ESOPHAGOGASTRODUODENOSCOPY (EGD) WITH PROPOFOL N/A 08/16/2020  ? Castaneda, H pylori positive  ? HERNIA REPAIR    ? ? ?Family History  ?Problem Relation Age of Onset  ? Diabetes Mother   ? COPD Mother   ? Diabetes Father   ? Hypertension Father   ? Heart disease Father   ? ? ?Current Outpatient Medications on File Prior to Visit  ?Medication Sig Dispense Refill  ? albuterol (VENTOLIN HFA) 108 (90 Base) MCG/ACT inhaler Inhale 1 puff into the lungs every 4 (four) hours as needed for wheezing or shortness of breath (cough). 8 g 2  ? Famotidine (PEPCID AC PO) Take 20 mg by mouth daily.    ? FLUoxetine (PROZAC) 20 MG capsule Take 1 capsule (20 mg total) by mouth daily. 90 capsule 3  ? fluticasone (FLONASE) 50 MCG/ACT nasal  spray Place 1 spray into both nostrils daily. 15.8 mL 2  ? loratadine (CLARITIN) 10 MG tablet Take 1 tablet (10 mg total) by mouth daily. 30 tablet 0  ? metoprolol succinate (TOPROL-XL) 50 MG 24 hr tablet Take 2 tablets (100 mg total) by mouth daily. Take with or immediately following a meal. 180 tablet 1  ? pantoprazole (PROTONIX) 20 MG tablet Take 1 tablet (20 mg total) by mouth 2 (two) times daily. 60 tablet 0  ? ?No current facility-administered medications on file prior to visit.  ? ? ?Allergies  ?Allergen Reactions  ? Lisinopril Other (See Comments)  ?  Unknown reaction ?Unknown reaction ?Other reaction(s): elevated BP ?Unknown reaction ? ?Other reaction(s): elevated BP  ? Sulfa Antibiotics Hives  ? ? ?Social History  ? ?Substance and Sexual Activity  ?Alcohol Use Not Currently  ? ? ?Social History  ? ?Tobacco Use  ?Smoking Status Never  ?Smokeless Tobacco Never  ? ? ?Review of Systems  ?Constitutional:  Positive for malaise/fatigue.  ?HENT: Negative.    ?Eyes: Negative.   ?Respiratory: Negative.    ?Cardiovascular: Negative.   ?Gastrointestinal:  Positive for abdominal pain and heartburn.  ?Genitourinary: Negative.   ?Musculoskeletal: Negative.   ?Skin: Negative.   ?Neurological: Negative.   ?  Endo/Heme/Allergies: Negative.   ?Psychiatric/Behavioral: Negative.    ? ?Objective  ? ?Vitals:  ? 06/01/21 1159  ?BP: 131/79  ?Pulse: 99  ?Resp: 18  ?Temp: 98.2 ?F (36.8 ?C)  ?SpO2: 95%  ? ? ?Physical Exam ?Vitals reviewed.  ?Constitutional:   ?   Appearance: Normal appearance. He is obese. He is not ill-appearing.  ?HENT:  ?   Head: Normocephalic and atraumatic.  ?Cardiovascular:  ?   Rate and Rhythm: Normal rate and regular rhythm.  ?   Heart sounds: Normal heart sounds. No murmur heard. ?  No friction rub. No gallop.  ?Pulmonary:  ?   Effort: Pulmonary effort is normal. No respiratory distress.  ?   Breath sounds: Normal breath sounds. No stridor. No wheezing, rhonchi or rales.  ?Abdominal:  ?   General: Bowel sounds  are normal. There is no distension.  ?   Palpations: Abdomen is soft. There is no mass.  ?   Tenderness: There is no abdominal tenderness. There is no guarding or rebound.  ?   Comments: Patient does have bulging along the medial aspect of a right lower quadrant transverse surgical scar.  Due to his body habitus, I cannot feel a specific hernia defect.  He does have some discomfort to palpation, but no exquisite tenderness.  No rigidity is noted.  ?Skin: ?   General: Skin is warm and dry.  ?Neurological:  ?   Mental Status: He is alert and oriented to person, place, and time.  ? ?ER notes including CT scan report reviewed ?Assessment  ?Bulging at right lower quadrant old open appendectomy site.  This could be an occult hernia that is not seen on CAT scan, or disruption of a layer of the abdominal wall with laxity.  No discrete hernia is appreciated, though there still could be one present. ?Plan  ?I told the patient that I do not recommend surgical intervention at this time until his sleep study results are noted.  This could be abdominal wall strain and laxity without a specific hernia.  He will monitor the situation and return to my care in 1 to 2 months for reevaluation.  He understands and agrees.  Follow-up here expectantly. ?

## 2021-06-06 ENCOUNTER — Ambulatory Visit: Payer: 59 | Admitting: General Surgery

## 2021-06-13 ENCOUNTER — Telehealth: Payer: Self-pay

## 2021-06-13 NOTE — Telephone Encounter (Signed)
LVM for pt to call me back to schedule sleep study  

## 2021-06-15 ENCOUNTER — Ambulatory Visit (INDEPENDENT_AMBULATORY_CARE_PROVIDER_SITE_OTHER): Payer: 59 | Admitting: Gastroenterology

## 2021-06-15 ENCOUNTER — Ambulatory Visit: Payer: 59 | Admitting: Family Medicine

## 2021-06-18 ENCOUNTER — Ambulatory Visit (INDEPENDENT_AMBULATORY_CARE_PROVIDER_SITE_OTHER): Payer: 59 | Admitting: Neurology

## 2021-06-18 DIAGNOSIS — G4733 Obstructive sleep apnea (adult) (pediatric): Secondary | ICD-10-CM

## 2021-06-18 DIAGNOSIS — Z820 Family history of epilepsy and other diseases of the nervous system: Secondary | ICD-10-CM

## 2021-06-18 DIAGNOSIS — R0683 Snoring: Secondary | ICD-10-CM

## 2021-06-18 DIAGNOSIS — R351 Nocturia: Secondary | ICD-10-CM

## 2021-06-18 DIAGNOSIS — G4719 Other hypersomnia: Secondary | ICD-10-CM

## 2021-06-18 DIAGNOSIS — R0689 Other abnormalities of breathing: Secondary | ICD-10-CM

## 2021-06-18 DIAGNOSIS — K219 Gastro-esophageal reflux disease without esophagitis: Secondary | ICD-10-CM

## 2021-06-18 DIAGNOSIS — G4720 Circadian rhythm sleep disorder, unspecified type: Secondary | ICD-10-CM

## 2021-06-18 DIAGNOSIS — R0681 Apnea, not elsewhere classified: Secondary | ICD-10-CM

## 2021-06-20 ENCOUNTER — Telehealth: Payer: Self-pay | Admitting: Gastroenterology

## 2021-06-20 ENCOUNTER — Telehealth (INDEPENDENT_AMBULATORY_CARE_PROVIDER_SITE_OTHER): Payer: Self-pay

## 2021-06-20 NOTE — Telephone Encounter (Signed)
I called and left a vm asking the patient to return call which medication? Colestipol? And If so what pharmacy? ? ?Patient called stating he would like for [place] to send in the diarrhea medication that we have sent in the past to his pharmacy. ?

## 2021-06-20 NOTE — Telephone Encounter (Signed)
We received a call from patient requesting a transfer of care from Logan Dawson to [facility]. I advised patient that we would need his records faxed to our office for review by our D.O.D.  ? ?

## 2021-06-21 ENCOUNTER — Encounter: Payer: Self-pay | Admitting: *Deleted

## 2021-06-21 ENCOUNTER — Ambulatory Visit: Payer: 59 | Admitting: Family Medicine

## 2021-06-21 ENCOUNTER — Telehealth: Payer: Self-pay | Admitting: *Deleted

## 2021-06-21 NOTE — Procedures (Signed)
PATIENT'S NAME:  Logan Dawson, Boonstra ?DOB:      09-11-1995      ?MR#:    [id number]     ?DATE OF RECORDING: 06/18/2021 ?REFERRING M.D.:  Darla Lesches, [place] ?Study Performed:  Split-Night Titration Study ?HISTORY: 26 year old man with a history of asthma, history of meningitis, history of subarachnoid hemorrhage and skull fracture in the remote past, history of hypertension, ADHD, and severe obesity with a BMI of over 40, who reports snoring and excessive daytime somnolence, as well as witnessed apneas. The patient endorsed the Epworth Sleepiness Scale at 21 points. The patient's weight 315 pounds with a height of 72 (inches), resulting in a BMI of 42.7 kg/m2. The patient's neck circumference measured 18.8 inches. ? ?CURRENT MEDICATIONS: Ventolin HFA, Pepcid AC, Prozac, Flonase, Claritin, Toprol-XL, Protonix ? ?PROCEDURE:  This is a multichannel digital polysomnogram utilizing the Somnostar 11.2 system.  Electrodes and sensors were applied and monitored per AASM Specifications.   EEG, EOG, Chin and Limb EMG, were sampled at 200 Hz.  ECG, Snore and Nasal Pressure, Thermal Airflow, Respiratory Effort, CPAP Flow and Pressure, Oximetry was sampled at 50 Hz. Digital video and audio were recorded.     ? ?BASELINE STUDY WITHOUT CPAP RESULTS: ? ?Lights Out was at 20:40 and Lights On at 05:00 for the night; the patient qualified for an emergency split study, with PAP initiated at 00:08, epoch 424, due to an AHI of 91.4/h. Total recording time (TRT) was 216.5, with a total sleep time (TST) of 130 minutes.   The patient's sleep latency was 66 minutes, which is delayed. REM latency was 133.5 minutes, which is mildly delayed. The sleep efficiency was 60. %.  ?  ?SLEEP ARCHITECTURE: WASO (Wake after sleep onset) was 23.5 minutes with mild sleep fragmentation noted, Stage N1 was 18.5 minutes, Stage N2 was 103 minutes, Stage N3 was 0 minutes and Stage R (REM sleep) was 8.5 minutes.  The percentages were Stage N1 14.2%, Stage N2 79.2%, both  increased, Stage N3 was absent, and Stage R (REM sleep) 6.5%. The arousals were noted as: 4 were spontaneous, 0 were associated with PLMs, 30 were associated with respiratory events. ? ?RESPIRATORY ANALYSIS:  There were a total of 198 respiratory events:  80 obstructive apneas, 0 central apneas and 0 mixed apneas with a total of 80 apneas and an apnea index (AI) of 36.9. There were 118 hypopneas with a hypopnea index of 54.5. The patient also had 0 respiratory event related arousals (RERAs).  ?Snoring was noted. ?    ?The total APNEA/HYPOPNEA INDEX (AHI) was 91.4 /hour and the total RESPIRATORY DISTURBANCE INDEX was 91.4 /hour.  10 events occurred in REM sleep and 230 events in NREM. The REM AHI was 70.6, /hour versus a non-REM AHI of 92.8 /hour. The patient spent 239.5 minutes sleep time in the supine position 153 minutes in non-supine. The supine AHI was 64.4 /hour versus a non-supine AHI of 96.4 /hour. ? ?OXYGEN SATURATION & C02:  The wake baseline 02 saturation was 95%, with the lowest being 78%. Time spent below 89% saturation equaled 63 minutes. ? ?PERIODIC LIMB MOVEMENTS: The patient had a total of 0 Periodic Limb Movements.  The Periodic Limb Movement (PLM) index was 0 /hour and the PLM Arousal index was 0 /hour.  ? ?Audio and video analysis did not show any abnormal or unusual movements, behaviors, phonations or vocalizations. The patient took no bathroom breaks. Mild to moderate snoring was noted. The EKG was in keeping with normal sinus  rhythm (NSR).  ? ?TITRATION STUDY WITH CPAP RESULTS: ?  ?The patient was fitted with a medium Simplus FFM. CPAP was initiated at 5 cmH20 with heated humidity per AASM split night standards and pressure was advanced to 12 cmH20 because of hypopneas, apneas and desaturations.  At a PAP pressure of 12 cmH20, there was a reduction of the AHI to 1.3/hour with supine REM sleep achieved and O2 nadir of 88% (briefly).  ? ?Total recording time (TRT) was 283.5 minutes, with a total  sleep time (TST) of 262 minutes. The patient's sleep latency was 17.5 minutes. REM latency was 84.5 minutes.  The sleep efficiency was 92.4 %.   ? ?SLEEP ARCHITECTURE: Wake after sleep was 13 minutes, Stage N1 13 minutes, Stage N2 112.5 minutes, Stage N3 32 minutes and Stage R (REM sleep) 104.5 minutes. The percentages were: Stage N1 5.%, Stage N2 42.9%, Stage N3 12.2% and Stage R (REM sleep) 39.9%, which is increased, and in keeping with rebound.  ?The arousals were noted as: 14 were spontaneous, 0 were associated with PLMs, 9 were associated with respiratory events. ? ?RESPIRATORY ANALYSIS:  There were a total of 103 respiratory events: 4 obstructive apneas, 0 central apneas and 0 mixed apneas with a total of 4 apneas and an apnea index (AI) of .9. There were 99 hypopneas with a hypopnea index of 22.7 /hour. The patient also had 0 respiratory event related arousals (RERAs).     ? ?The total APNEA/HYPOPNEA INDEX  (AHI) was 23.6 /hour and the total RESPIRATORY DISTURBANCE INDEX was 23.6 /hour.  10 events occurred in REM sleep and 93 events in NREM. The REM AHI was 5.7 /hour versus a non-REM AHI of 35.4 /hour. REM sleep was achieved on a pressure of  cm/h2o (AHI was  .) The patient spent 84% of total sleep time in the supine position. The supine AHI was 11.5 /hour, versus a non-supine AHI of 85.1/hour. ? ?OXYGEN SATURATION & C02:  The wake baseline 02 saturation was 96%, with the lowest being 86%. Time spent below 89% saturation equaled 14 minutes. ? ?PERIODIC LIMB MOVEMENTS: The patient had a total of 0 Periodic Limb Movements. The Periodic Limb Movement (PLM) index was 0 /hour and the PLM Arousal index was 0 /hour. ? ?Post-study, the patient indicated that sleep was better than usual. ? ?POLYSOMNOGRAPHY IMPRESSION :  ? ?Severe Obstructive Sleep Apnea (OSA)  ?Dysfunctions associated with sleep stages or arousals from sleep ?RECOMMENDATIONS: ? ?This patient has severe obstructive sleep apnea; the patient qualified  for an emergency split study, with PAP initiated at 00:08, epoch 424, due to a baseline AHI of 91.4/h and O2 nadir of 78% (during REM sleep). He responded well to CPAP therapy with near-complete resolution of his sleep disordered breathing on a set pressure of 12 cm. I will, therefore, advise the patient to start home CPAP treatment at a pressure of 12 cm via medium FFM, with heated humidity. The patient will be advised to be fully compliant with PAP therapy to improve sleep related symptoms and decrease long term cardiovascular risks. Please note that untreated obstructive sleep apnea may carry additional perioperative morbidity. Patients with significant obstructive sleep apnea should receive perioperative PAP therapy and the surgeons and particularly the anesthesiologist should be informed of the diagnosis and the severity of the sleep disordered breathing. ?This study shows sleep fragmentation and abnormal sleep stage percentages; these are nonspecific findings and per se do not signify an intrinsic sleep disorder or a cause for the  patient's sleep-related symptoms. Causes include (but are not limited to) the first night effect of the sleep study, circadian rhythm disturbances, medication effect or an underlying mood disorder or medical problem.  ?The patient should be cautioned not to drive, work at heights, or operate dangerous or heavy equipment when tired or sleepy. Review and reiteration of good sleep hygiene measures should be pursued with any patient. ?The patient will be seen in follow-up in the sleep clinic at [facility] for discussion of the test results, symptom and treatment compliance review, further management strategies, etc. The referring provider will be notified of the test results. ? ?I certify that I have reviewed the entire raw data recording prior to the issuance of this report in accordance with the Standards of Accreditation of the [place] [employer] (AASM) ? ?Star Age, MD,  PhD ?Diplomat, American Board of Neurology and Sleep Medicine (Neurology and Sleep Medicine) ? ? ? ? ? ? ?

## 2021-06-21 NOTE — Telephone Encounter (Signed)
-----   Message from Star Age, MD sent at 06/21/2021 11:22 AM EDT ----- ?Patient referred by PCP, seen by me on 05/30/21, patient had a split night sleep study on 06/18/21 (emergency split). ?Please expedite set up/request urgent set up through DME. ? ?Please call and notify patient that the recent sleep study showed severe obstructive sleep apnea (OSA). He did well with CPAP during the study with significant improvement of the respiratory events. I would like start the patient on a home CPAP machine. I placed the order in the chart.  ?Please advise patient that we will need a follow up appointment with either myself or one of our nurse practitioners in about 2-3 months post set-up to check for how they are doing on treatment and how well it's going with the machine in general. Most insurance company require a certain compliance percentage to continue to cover/pay for the machine. Please ask patient to schedule this FU appointment, according to the set-up date, which is the day they receive the machine. Please make sure, the patient understands the importance of keeping this window for the FU appointment, as it is often an Designer, industrial/product and not our rule. Failing to adhere to this may result in losing coverage for sleep apnea treatment, at which point some insurances require repeating the whole process. Plus, monitoring compliance data is usually good feedback for the patient as far as how they are doing, how many hours they are on it, how well the mask fits, etc.  ?Also remind patient, that any PAP machine or mask issues should be first addressed with the DME company, who provided the machine/supplies.  ?Please ask if patient has a preference regarding DME company, may depend on the insurance too. ? ?Star Age, MD, PhD ?[facility])  ? ? ?

## 2021-06-21 NOTE — Telephone Encounter (Signed)
Spoke with patient and discussed his sleep study results. Pt aware that his sleep study showed severe obstructive sleep apnea and that he did well on CPAP.  Patient also states he noticed he felt more awake and rested after he used the CPAP during the sleep study.  He is amenable to proceeding with CPAP at home.  The patient understands insurance compliance requirements which includes using the machine at least 4 hours at night and also being seen here in our office for an initial follow-up between 30 and 90 days after set up.  The patient did not have a specific preference for DME other than wherever takes his insurance.  We looked at 3 places that are closest in proximity to him and they do not take his insurance.  I spoke with the patient.  We will use [facility].  He is aware that we will call him within approximately 2 days.  Will mail a follow-up letter to his home.  I gave him [facility]'s phone number to call if needed.  His initial follow-up appointment was scheduled for Thursday, August 3 at 9 AM arrival 8:30 AM with machine and power cord.  Appointment will be with Amy NP. patient's questions were answered and he verbalized appreciation for the call. ? ?Order, sleep study result, office note, insurance info faxed to [facility]. Received a receipt of confirmation. Letter mailed to pt.  ? ?

## 2021-06-21 NOTE — Addendum Note (Signed)
Addended by: [name] on: 06/21/2021 11:22 AM ? ? Modules accepted: Orders ? ?

## 2021-06-22 ENCOUNTER — Encounter: Payer: Self-pay | Admitting: Family Medicine

## 2021-06-22 ENCOUNTER — Ambulatory Visit (INDEPENDENT_AMBULATORY_CARE_PROVIDER_SITE_OTHER): Payer: 59 | Admitting: Family Medicine

## 2021-06-22 VITALS — BP 131/89 | HR 101 | Temp 98.0°F | Ht 72.0 in | Wt 318.0 lb

## 2021-06-22 DIAGNOSIS — H539 Unspecified visual disturbance: Secondary | ICD-10-CM

## 2021-06-22 DIAGNOSIS — J40 Bronchitis, not specified as acute or chronic: Secondary | ICD-10-CM

## 2021-06-22 DIAGNOSIS — R42 Dizziness and giddiness: Secondary | ICD-10-CM

## 2021-06-22 DIAGNOSIS — J301 Allergic rhinitis due to pollen: Secondary | ICD-10-CM | POA: Diagnosis not present

## 2021-06-22 MED ORDER — PREDNISONE 10 MG (21) PO TBPK: ORAL_TABLET | ORAL | 0 refills | Status: DC

## 2021-06-22 MED ORDER — LEVOCETIRIZINE DIHYDROCHLORIDE 5 MG PO TABS: 5.0000 mg | ORAL_TABLET | Freq: Every evening | ORAL | 11 refills | Status: DC

## 2021-06-22 MED ORDER — FLUTICASONE PROPIONATE 50 MCG/ACT NA SUSP: 2.0000 | Freq: Every day | NASAL | 6 refills | Status: DC

## 2021-06-22 MED ORDER — GUAIFENESIN 100 MG/5ML PO LIQD: 5.0000 mL | ORAL | 0 refills | Status: DC | PRN
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
## 2021-06-29 ENCOUNTER — Other Ambulatory Visit (INDEPENDENT_AMBULATORY_CARE_PROVIDER_SITE_OTHER): Payer: Self-pay

## 2021-06-29 ENCOUNTER — Encounter (INDEPENDENT_AMBULATORY_CARE_PROVIDER_SITE_OTHER): Payer: Self-pay

## 2021-06-29 ENCOUNTER — Encounter (INDEPENDENT_AMBULATORY_CARE_PROVIDER_SITE_OTHER): Payer: Self-pay | Admitting: Gastroenterology

## 2021-06-29 ENCOUNTER — Ambulatory Visit (INDEPENDENT_AMBULATORY_CARE_PROVIDER_SITE_OTHER): Payer: 59 | Admitting: Gastroenterology

## 2021-06-29 ENCOUNTER — Telehealth (INDEPENDENT_AMBULATORY_CARE_PROVIDER_SITE_OTHER): Payer: Self-pay

## 2021-06-29 VITALS — BP 131/82 | HR 118 | Temp 97.5°F | Ht 72.0 in | Wt 312.4 lb

## 2021-06-29 DIAGNOSIS — K9089 Other intestinal malabsorption: Secondary | ICD-10-CM | POA: Diagnosis not present

## 2021-06-29 DIAGNOSIS — K529 Noninfective gastroenteritis and colitis, unspecified: Secondary | ICD-10-CM

## 2021-06-29 MED ORDER — COLESEVELAM HCL 625 MG PO TABS: 1875.0000 mg | ORAL_TABLET | Freq: Two times a day (BID) | ORAL | 1 refills | Status: DC

## 2021-06-29 MED ORDER — PEG 3350-KCL-NA BICARB-NACL 420 G PO SOLR: 4000.0000 mL | ORAL | 0 refills | Status: DC

## 2021-06-29 NOTE — Telephone Encounter (Signed)
Logan Dawson Logan Dawson, CMA  ?

## 2021-06-29 NOTE — Progress Notes (Signed)
Katrinka Blazing, M.D. Gastroenterology & Hepatology [facility] [address] [place], [place] [address]  Primary Care Physician: Sonny Masters, FNP [address] [place] [address]  I will communicate my assessment and recommendations to the referring MD via EMR.  Problems: Chronic diarrhea History of H. pylori status post eradication  History of Present Illness: Logan Dawson is a 26 y.o. male with PMH ADHD, HTN, meningitis, Asthma and H. Pylori s/p eradication, who presents for follow up of chronic diarrhea.  The patient was last seen on 02/20/2021. At that time, the patient was started on Colestid 2 g daily and was given a prescription for Levsin as needed.  H. pylori breath test came back negative.  Patient took Colestid 2 g per day as advised but his diarrhea did not improve. He is taking OTC antidiarrheals, but he is not sure which medication he is taking.  He is still having watery Bms, 8-10 Bms per day. Has to have a BM during the nights occasionally but this is infrequent. Had episodes of fecal soiling due to urgency to have a BM. The patient denies having any nausea, vomiting, fever, chills, hematochezia, abdominal distention,  jaundice, pruritus or weight change.  States he has occasional epsiodes of abdominal pain but is not near as frequent and severe as he had prior to H. Pylori treatment.  He in on pantoprazole 20 mg qday for GERD but has been on it for <1 year.  Patient went to [facility] on 05/20/2021 - CBC showed WBC 11.4, Hb 14.4, PLT 241. Had CT abdomen and pelvis with IV contrast which was normal. CMP showed presence of mildly elevated AST 53 ALT 51 ,  albumin 3.4 , rest WNL.  Last EGD: 08/2020 - Normal esophagus. - Normal stomach. Biopsied. - Normal examined duodenum. Biopsied. Biopsies positive for H. Pylori  Last Colonoscopy: never  Past Medical History: Past Medical History:  Diagnosis Date    ADHD (attention deficit hyperactivity disorder)    Anxiety    Asthma    Gastroenteritis    Hypertension    Left shoulder pain    Meningitis    Migraine    after skull fracture   Skull fracture (HCC)    Subarachnoid hemorrhage (HCC)     Past Surgical History: Past Surgical History:  Procedure Laterality Date   APPENDECTOMY     BIOPSY  08/16/2020   Procedure: BIOPSY;  Surgeon: Dolores Frame, MD;  Location: [facility];  Service: Gastroenterology;;   CHOLECYSTECTOMY     ESOPHAGOGASTRODUODENOSCOPY (EGD) WITH PROPOFOL N/A 08/16/2020   Levon Hedger, H pylori positive   HERNIA REPAIR      Family History: Family History  Problem Relation Age of Onset   Diabetes Mother    COPD Mother    Diabetes Father    Hypertension Father    Heart disease Father     Social History: Social History   Tobacco Use  Smoking Status Never  Smokeless Tobacco Never   Social History   Substance and Sexual Activity  Alcohol Use Not Currently   Social History   Substance and Sexual Activity  Drug Use No    Allergies: Allergies  Allergen Reactions   Lisinopril Other (See Comments)    Unknown reaction Unknown reaction Other reaction(s): elevated BP Unknown reaction  Other reaction(s): elevated BP   Sulfa Antibiotics Hives    Medications: Current Outpatient Medications  Medication Sig Dispense Refill   albuterol (VENTOLIN HFA)  108 (90 Base) MCG/ACT inhaler Inhale 1 puff into the lungs every 4 (four) hours as needed for wheezing or shortness of breath (cough). 8 g 2   Famotidine (PEPCID AC PO) Take 20 mg by mouth daily.     FLUoxetine (PROZAC) 20 MG capsule Take 1 capsule (20 mg total) by mouth daily. 90 capsule 3   fluticasone (FLONASE) 50 MCG/ACT nasal spray Place 2 sprays into both nostrils daily. 16 g 6   guaiFENesin (TUSSIN) 100 MG/5ML liquid Take 5 mLs by mouth every 4 (four) hours as needed for cough or to loosen phlegm. 120 mL 0   levocetirizine (XYZAL) 5 MG  tablet Take 1 tablet (5 mg total) by mouth every evening. 30 tablet 11   metoprolol succinate (TOPROL-XL) 50 MG 24 hr tablet Take 2 tablets (100 mg total) by mouth daily. Take with or immediately following a meal. (Patient taking differently: Take 50 mg by mouth daily. Take with or immediately following a meal.) 180 tablet 1   pantoprazole (PROTONIX) 20 MG tablet Take 1 tablet (20 mg total) by mouth 2 (two) times daily. 60 tablet 0   No current facility-administered medications for this visit.    Review of Systems: GENERAL: negative for malaise, night sweats HEENT: No changes in hearing or vision, no nose bleeds or other nasal problems. NECK: Negative for lumps, goiter, pain and significant neck swelling RESPIRATORY: Negative for cough, wheezing CARDIOVASCULAR: Negative for chest pain, leg swelling, palpitations, orthopnea GI: SEE HPI MUSCULOSKELETAL: Negative for joint pain or swelling, back pain, and muscle pain. SKIN: Negative for lesions, rash PSYCH: Negative for sleep disturbance, mood disorder and recent psychosocial stressors. HEMATOLOGY Negative for prolonged bleeding, bruising easily, and swollen nodes. ENDOCRINE: Negative for cold or heat intolerance, polyuria, polydipsia and goiter. NEURO: negative for tremor, gait imbalance, syncope and seizures. The remainder of the review of systems is noncontributory.   Physical Exam: BP 131/82 (BP Location: Left Arm, Patient Position: Sitting, Cuff Size: Large)   Pulse (!) 118   Temp (!) 97.5 F (36.4 C) (Oral)   Ht 6' (1.829 m)   Wt (!) 312 lb 6.4 oz (141.7 kg)   BMI 42.37 kg/m  GENERAL: The patient is AO x3, in no acute distress. Obese. HEENT: Head is normocephalic and atraumatic. EOMI are intact. Mouth is well hydrated and without lesions. NECK: Supple. No masses LUNGS: Clear to auscultation. No presence of rhonchi/wheezing/rales. Adequate chest expansion HEART: RRR, normal s1 and s2. ABDOMEN: Soft, nontender, no guarding, no  peritoneal signs, and nondistended. BS +. No masses. EXTREMITIES: Without any cyanosis, clubbing, rash, lesions or edema. NEUROLOGIC: AOx3, no focal motor deficit. SKIN: no jaundice, no rashes  Imaging/Labs: as above  I personally reviewed and interpreted the available labs, imaging and endoscopic files.  Impression and Plan: Logan Dawson is a 26 y.o. male with PMH ADHD, HTN, meningitis, Asthma and H. Pylori s/p eradication, who presents for follow up of chronic diarrhea. The patient has presented chronic diarrhea shortly after undergoing colonoscopy. He did not improve with the use of low dose of Colestid. It is possible that his diarrhea is related to bile salt diarrhea given the timeline of his symptoms, which may actually improve even more with colesevelam. However, given persistence of refractory symptoms we will explore further other etiologies with a colonoscopy.  - Schedule colonoscopy - Stop Colestid, start colesevelam  All questions were answered.      Dolores Frame, MD Gastroenterology and Hepatology [facility]

## 2021-06-29 NOTE — Patient Instructions (Signed)
Schedule colonoscopy Stop Colestid, start colesevelam

## 2021-07-04 ENCOUNTER — Encounter: Payer: Self-pay | Admitting: General Surgery

## 2021-07-04 ENCOUNTER — Ambulatory Visit (INDEPENDENT_AMBULATORY_CARE_PROVIDER_SITE_OTHER): Payer: 59 | Admitting: General Surgery

## 2021-07-04 VITALS — BP 118/85 | HR 107 | Temp 97.7°F | Resp 18 | Ht 72.0 in | Wt 315.0 lb

## 2021-07-04 DIAGNOSIS — K439 Ventral hernia without obstruction or gangrene: Secondary | ICD-10-CM

## 2021-07-05 NOTE — Progress Notes (Signed)
Subjective:     Logan Dawson  Patient returns to my care for evaluation of his right lower quadrant abdominal wall pain.  He also states he has started having umbilical pain.  He has had a sleep study work-up which showed he needs a CPAP mask at night.  He has not obtained that yet.  He also was recently treated for bronchitis with steroids. Objective:    BP 118/85   Pulse (!) 107   Temp 97.7 F (36.5 C) (Oral)   Resp 18   Ht 6' (1.829 m)   Wt (!) 315 lb (142.9 kg)   SpO2 95%   BMI 42.72 kg/m   General:  alert, cooperative, and no distress  Abdomen is rotund.  He still has of the abdominal wall in the right lower quadrant beneath a large transverse scar.  He also has a small hernia at the umbilicus.  He states he had an umbilical herniorrhaphy in the remote past.     Assessment:    Newly diagnosed recurrent umbilical hernia, question incisional hernia in right lower quadrant, newly diagnosed sleep apnea    Plan:   I told the patient that he does not need urgent surgical intervention at this time.  He must start treatment for his pulmonary disease prior to any surgical intervention.  I told him to return to my care in several months after he has had his pulmonary status treated.  Will reassess the hernias at that time.

## 2021-07-14 ENCOUNTER — Encounter (INDEPENDENT_AMBULATORY_CARE_PROVIDER_SITE_OTHER): Payer: Self-pay

## 2021-07-24 ENCOUNTER — Ambulatory Visit (INDEPENDENT_AMBULATORY_CARE_PROVIDER_SITE_OTHER): Payer: 59 | Admitting: Gastroenterology

## 2021-08-07 ENCOUNTER — Other Ambulatory Visit (INDEPENDENT_AMBULATORY_CARE_PROVIDER_SITE_OTHER): Payer: Self-pay | Admitting: Gastroenterology

## 2021-08-07 ENCOUNTER — Ambulatory Visit (INDEPENDENT_AMBULATORY_CARE_PROVIDER_SITE_OTHER): Payer: 59 | Admitting: Gastroenterology

## 2021-08-07 DIAGNOSIS — K9089 Other intestinal malabsorption: Secondary | ICD-10-CM

## 2021-08-07 MED ORDER — CHOLESTYRAMINE 4 G PO PACK: 4.0000 g | PACK | Freq: Three times a day (TID) | ORAL | 12 refills | Status: DC

## 2021-08-07 NOTE — Telephone Encounter (Signed)
We will send cholestyramine 3 times daily instead of coleveselam

## 2021-08-19 ENCOUNTER — Other Ambulatory Visit: Payer: Self-pay

## 2021-08-19 ENCOUNTER — Emergency Department (HOSPITAL_COMMUNITY): Payer: 59

## 2021-08-19 ENCOUNTER — Encounter (HOSPITAL_COMMUNITY): Payer: Self-pay

## 2021-08-19 ENCOUNTER — Emergency Department (HOSPITAL_COMMUNITY)
Admission: EM | Admit: 2021-08-19 | Discharge: 2021-08-19 | Disposition: A | Discharge status: Home / Self Care | Payer: 59 | Attending: Emergency Medicine | Admitting: Emergency Medicine

## 2021-08-19 DIAGNOSIS — G4733 Obstructive sleep apnea (adult) (pediatric): Secondary | ICD-10-CM | POA: Diagnosis not present

## 2021-08-19 DIAGNOSIS — J45909 Unspecified asthma, uncomplicated: Secondary | ICD-10-CM | POA: Insufficient documentation

## 2021-08-19 DIAGNOSIS — Z91040 Latex allergy status: Secondary | ICD-10-CM | POA: Diagnosis not present

## 2021-08-19 DIAGNOSIS — I10 Essential (primary) hypertension: Secondary | ICD-10-CM | POA: Insufficient documentation

## 2021-08-19 DIAGNOSIS — R1031 Right lower quadrant pain: Secondary | ICD-10-CM | POA: Insufficient documentation

## 2021-08-19 LAB — CBC
HCT: 44.4 % (ref 39.0–52.0)
Hemoglobin: 15.5 g/dL (ref 13.0–17.0)
MCH: 30.1 pg (ref 26.0–34.0)
MCHC: 34.9 g/dL (ref 30.0–36.0)
MCV: 86.2 fL (ref 80.0–100.0)
Platelets: 292 10*3/uL (ref 150–400)
RBC: 5.15 MIL/uL (ref 4.22–5.81)
RDW: 12.4 % (ref 11.5–15.5)
WBC: 10.1 10*3/uL (ref 4.0–10.5)
nRBC: 0 % (ref 0.0–0.2)

## 2021-08-19 LAB — URINALYSIS, ROUTINE W REFLEX MICROSCOPIC
Bacteria, UA: NONE SEEN
Bilirubin Urine: NEGATIVE
Glucose, UA: NEGATIVE mg/dL
Hgb urine dipstick: NEGATIVE
Ketones, ur: NEGATIVE mg/dL
Leukocytes,Ua: NEGATIVE
Nitrite: NEGATIVE
Protein, ur: 30 mg/dL — AB
Specific Gravity, Urine: 1.028 (ref 1.005–1.030)
pH: 7 (ref 5.0–8.0)

## 2021-08-19 LAB — COMPREHENSIVE METABOLIC PANEL
ALT: 57 U/L — ABNORMAL HIGH (ref 0–44)
AST: 82 U/L — ABNORMAL HIGH (ref 15–41)
Albumin: 4.1 g/dL (ref 3.5–5.0)
Alkaline Phosphatase: 59 U/L (ref 38–126)
Anion gap: 8 (ref 5–15)
BUN: 14 mg/dL (ref 6–20)
CO2: 28 mmol/L (ref 22–32)
Calcium: 9.1 mg/dL (ref 8.9–10.3)
Chloride: 104 mmol/L (ref 98–111)
Creatinine, Ser: 0.83 mg/dL (ref 0.61–1.24)
GFR, Estimated: >60 mL/min (ref >60)
Glucose, Bld: 94 mg/dL (ref 70–99)
Potassium: 4 mmol/L (ref 3.5–5.1)
Sodium: 140 mmol/L (ref 135–145)
Total Bilirubin: 0.7 mg/dL (ref 0.3–1.2)
Total Protein: 7.6 g/dL (ref 6.5–8.1)

## 2021-08-19 LAB — LIPASE, BLOOD: Lipase: 37 U/L (ref 11–51)

## 2021-08-19 MED ORDER — MORPHINE SULFATE (PF) 4 MG/ML IV SOLN
4.0000 mg | Freq: Once | INTRAVENOUS | Status: AC
  Administered 2021-08-19: 4 mg via INTRAVENOUS
  Filled 2021-08-19: qty 1

## 2021-08-19 MED ORDER — IOHEXOL 300 MG/ML  SOLN
100.0000 mL | Freq: Once | INTRAMUSCULAR | Status: AC | PRN
  Administered 2021-08-19: 100 mL via INTRAVENOUS

## 2021-08-19 MED ORDER — ONDANSETRON 4 MG PO TBDP
4.0000 mg | ORAL_TABLET | Freq: Once | ORAL | Status: AC | PRN
Start: 2021-08-19 — End: 2021-08-19
  Administered 2021-08-19: 4 mg via ORAL
  Filled 2021-08-19: qty 1

## 2021-08-19 MED ORDER — SODIUM CHLORIDE 0.9 % IV BOLUS
1000.0000 mL | Freq: Once | INTRAVENOUS | Status: AC
  Administered 2021-08-19: 1000 mL via INTRAVENOUS

## 2021-08-19 NOTE — ED Provider Notes (Signed)
[name] EMERGENCY DEPARTMENT Provider Note   CSN: [id number] Arrival date & time: 08/19/21  1406     History  Chief Complaint  Patient presents with   Abdominal Pain    Logan Dawson is a 26 y.o. male.   Abdominal Pain  Patient is a 26 year old male with a past medical history significant for asthma, HTN, migraine, ADHD   Patient is a 26 year old male presented emergency room today with complaints of right lower quadrant abdominal pain.  He states that he has a history of an appendectomy many years ago.  He has a scar over the site of the appendectomy.  He states for the past few months he has had some right lower quadrant pain.  He states is been mild and intermittent.  He was seen by a surgeon Dr. Arnoldo Morale who thought that this very well may be a hernia.  He states that today the area has become constantly painful very severely painful and prompted him to come to the emergency room for evaluation.      Home Medications Prior to Admission medications   Medication Sig Start Date End Date Taking? Authorizing Provider  albuterol (VENTOLIN HFA) 108 (90 Base) MCG/ACT inhaler Inhale 1 puff into the lungs every 4 (four) hours as needed for wheezing or shortness of breath (cough). Patient taking differently: Inhale 2 puffs into the lungs every 4 (four) hours as needed for wheezing or shortness of breath (cough). 08/12/19  Yes Johnson, Clanford L, MD  aspirin-acetaminophen-caffeine (EXCEDRIN MIGRAINE) [phone] MG tablet Take 2 tablets by mouth every 6 (six) hours as needed for headache.   Yes [provider]  cholestyramine (QUESTRAN) 4 g packet Take 1 packet (4 g total) by mouth 3 (three) times daily with meals. Patient taking differently: Take 4 g by mouth daily. 08/07/21  Yes Harvel Quale, MD  FLUoxetine (PROZAC) 20 MG capsule Take 1 capsule (20 mg total) by mouth daily. 03/07/21  Yes Rakes, Connye Burkitt, FNP  fluticasone (FLONASE) 50 MCG/ACT nasal spray Place 2  sprays into both nostrils daily. Patient taking differently: Place 2 sprays into both nostrils daily as needed for allergies. 06/22/21  Yes Rakes, Connye Burkitt, FNP  levocetirizine (XYZAL) 5 MG tablet Take 1 tablet (5 mg total) by mouth every evening. 06/22/21  Yes Rakes, Connye Burkitt, FNP  meloxicam (MOBIC) 7.5 MG tablet Take 7.5 mg by mouth 2 (two) times daily as needed for pain. 08/07/21  Yes [provider]  methocarbamol (ROBAXIN) 500 MG tablet Take 500 mg by mouth 2 (two) times daily. 08/07/21  Yes [provider]  metoprolol succinate (TOPROL-XL) 50 MG 24 hr tablet Take 2 tablets (100 mg total) by mouth daily. Take with or immediately following a meal. Patient taking differently: Take 50 mg by mouth daily. Take with or immediately following a meal. 03/07/21  Yes Rakes, Connye Burkitt, FNP  pantoprazole (PROTONIX) 40 MG tablet Take 40 mg by mouth daily.   Yes [provider]  guaiFENesin (TUSSIN) 100 MG/5ML liquid Take 5 mLs by mouth every 4 (four) hours as needed for cough or to loosen phlegm. Patient not taking: Reported on 08/16/2021 06/22/21   Baruch Gouty, FNP  pantoprazole (PROTONIX) 20 MG tablet Take 1 tablet (20 mg total) by mouth 2 (two) times daily. Patient not taking: Reported on 08/16/2021 04/15/21 08/16/21  Dorie Rank, MD  polyethylene glycol-electrolytes (TRILYTE) 420 g solution Take 4,000 mLs by mouth as directed. Patient not taking: Reported on 08/19/2021 06/29/21  Montez Morita, Quillian Quince, MD      Allergies    Lisinopril and Sulfa antibiotics    Review of Systems   Review of Systems  Gastrointestinal:  Positive for abdominal pain.    Physical Exam Updated Vital Signs BP (!) 129/52   Pulse 94   Temp 97.8 F (36.6 C) (Oral)   Resp 17   Ht 6' (1.829 m)   Wt (!) 144.8 kg   SpO2 95%   BMI 43.29 kg/m  Physical Exam Vitals and nursing note reviewed.  Constitutional:      General: He is not in acute distress. HENT:     Head: Normocephalic and atraumatic.      Nose: Nose normal.  Eyes:     General: No scleral icterus. Cardiovascular:     Rate and Rhythm: Normal rate and regular rhythm.     Pulses: Normal pulses.     Heart sounds: Normal heart sounds.  Pulmonary:     Effort: Pulmonary effort is normal. No respiratory distress.     Breath sounds: No wheezing.  Abdominal:     Palpations: Abdomen is soft.     Tenderness: There is abdominal tenderness.     Comments: In the right lower quadrant of patient's abdomen there is a horizontal surgical scar that is remote appears to be consistent with a scar from several years ago.  There is no redness or swelling in this area but there is no purulence or warmth to touch.  Skin feels normal.  No rashes.  There is some tenderness over this area.  There is palpable scar.   Abdomen is otherwise without any tenderness.  No guarding or rebound  Musculoskeletal:     Cervical back: Normal range of motion.     Right lower leg: No edema.     Left lower leg: No edema.  Skin:    General: Skin is warm and dry.     Capillary Refill: Capillary refill takes less than 2 seconds.  Neurological:     Mental Status: He is alert. Mental status is at baseline.  Psychiatric:        Mood and Affect: Mood normal.        Behavior: Behavior normal.     ED Results / Procedures / Treatments   Labs (all labs ordered are listed, but only abnormal results are displayed) Labs Reviewed  COMPREHENSIVE METABOLIC PANEL - Abnormal; Notable for the following components:      Result Value   AST 82 (*)    ALT 57 (*)    All other components within normal limits  URINALYSIS, ROUTINE W REFLEX MICROSCOPIC - Abnormal; Notable for the following components:   Protein, ur 30 (*)    All other components within normal limits  LIPASE, BLOOD  CBC    EKG None  Radiology CT ABDOMEN PELVIS W CONTRAST  Result Date: 08/19/2021 CLINICAL DATA:  Right lower quadrant pain. Diarrhea. Previous appendectomy. Suspicion for incisional hernia.  EXAM: CT ABDOMEN AND PELVIS WITH CONTRAST TECHNIQUE: Multidetector CT imaging of the abdomen and pelvis was performed using the standard protocol following bolus administration of intravenous contrast. RADIATION DOSE REDUCTION: This exam was performed according to the departmental dose-optimization program which includes automated exposure control, adjustment of the mA and/or kV according to patient size and/or use of iterative reconstruction technique. CONTRAST:  139m OMNIPAQUE IOHEXOL 300 MG/ML  SOLN COMPARISON:  01/26/2020 FINDINGS: Lower Chest: No acute findings. Hepatobiliary: No hepatic masses identified. Mild diffuse hepatic steatosis again demonstrated.  Prior cholecystectomy. No evidence of biliary obstruction. Pancreas:  No mass or inflammatory changes. Spleen: Within normal limits in size and appearance. Adrenals/Urinary Tract: No masses identified. No evidence of ureteral calculi or hydronephrosis. Stomach/Bowel: No evidence of obstruction, inflammatory process or abnormal fluid collections. Vascular/Lymphatic: No pathologically enlarged lymph nodes. No acute vascular findings. Reproductive:  No mass or other significant abnormality. Other:  None.  No evidence of ventral abdominal wall hernia or mass. Musculoskeletal:  No suspicious bone lesions identified. IMPRESSION: No acute findings. No evidence of ventral abdominal wall hernia or mass. Stable hepatic steatosis. Electronically Signed   By: Marlaine Hind M.D.   On: 08/19/2021 17:23    Procedures Procedures    Medications Ordered in ED Medications  ondansetron (ZOFRAN-ODT) disintegrating tablet 4 mg (4 mg Oral Given 08/19/21 1422)  sodium chloride 0.9 % bolus 1,000 mL (1,000 mLs Intravenous New Bag/Given 08/19/21 1650)  morphine (PF) 4 MG/ML injection 4 mg (4 mg Intravenous Given 08/19/21 1650)  iohexol (OMNIPAQUE) 300 MG/ML solution 100 mL (100 mLs Intravenous Contrast Given 08/19/21 1706)    ED Course/ Medical Decision Making/ A&P Clinical  Course as of 08/19/21 1753  Sat Aug 19, 2021  1632 Some discomfort for the past few months RLQ - seen by Arnoldo Morale thought to have a hernia. Today a few hours ago became very severe and constant.  [WF]  1727 CT [facility] Personally viewed images. Agree with radiology read.   IMPRESSION: No acute findings. No evidence of ventral abdominal wall hernia or mass.  Stable hepatic steatosis.   Electronically Signed   By: Marlaine Hind M.D.   On: 08/19/2021 17:23   [WF]    Clinical Course User Index [WF] Tedd Sias, PA                           Medical Decision Making Amount and/or Complexity of Data Reviewed Labs: ordered. Radiology: ordered.  Risk Prescription drug management.   This patient presents to the ED for concern of abdominal pain, this involves a number of treatment options, and is a complaint that carries with it a moderate risk of complications and morbidity.  The differential diagnosis includes primarily concern for incarcerated hernia.  Patient's physical exam is not particularly concerning for this however his symptoms and the possibility that he has a incisional hernia make this is a diagnosis of exclusion.  The general differential for abdominal pain is below  The causes of generalized abdominal pain include but are not limited to AAA, mesenteric ischemia, appendicitis, diverticulitis, DKA, gastritis, gastroenteritis, AMI, nephrolithiasis, pancreatitis, peritonitis, adrenal insufficiency,lead poisoning, iron toxicity, intestinal ischemia, constipation, UTI,SBO/LBO, splenic rupture, biliary disease, IBD, IBS, PUD, or hepatitis.   Co morbidities: Discussed in HPI   Brief History:  Patient is a 26 year old male with a past medical history significant for asthma, HTN, migraine, ADHD   Patient is a 26 year old male presented emergency room today with complaints of right lower quadrant abdominal pain.  He states that he has a history of an appendectomy many years ago.   He has a scar over the site of the appendectomy.  He states for the past few months he has had some right lower quadrant pain.  He states is been mild and intermittent.  He was seen by a surgeon Dr. Arnoldo Morale who thought that this very well may be a hernia.  He states that today the area has become constantly painful very severely painful and prompted him to  come to the emergency room for evaluation.    EMR reviewed including pt PMHx, past surgical history and past visits to ER.   See HPI for more details   Lab Tests:   I ordered and independently interpreted labs. Labs notable for very mild transaminitis.  May be alcohol related.  He will need to follow-up with PCP regarding this.  This is likely an incidental finding given no right upper quadrant abdominal pain epigastric pain and bilirubin within normal limits as well as alk phos   Imaging Studies:  NAD. I personally reviewed all imaging studies and no acute abnormality found. I agree with radiology interpretation. CT abdomen pelvis without any hernia.    Cardiac Monitoring:  NA NA   Medicines ordered:  I ordered medication including small dose of morphine, Zofran, 1 L normal for pain Reevaluation of the patient after these medicines showed that the patient resolved I have reviewed the patients home medicines and have made adjustments as needed   Critical Interventions:     Consults/Attending Physician      Reevaluation:  After the interventions noted above I re-evaluated patient and found that they have :resolved   Social Determinants of Health:      Problem List / ED Course:  Abdominal pain in right lower quadrant in the area of the incision from an old surgery.  I did have some concern for incarcerated hernia given that patient describes the area as historically soft and now hard complains of severe pain.  CT abdomen pelvis shows no hernia.  No acute abnormality.  Will discharge home with close  follow-up with PCP.   Dispostion:  After consideration of the diagnostic results and the patients response to treatment, I feel that the patent would benefit from outpatient follow-up.  Final Clinical Impression(s) / ED Diagnoses Final diagnoses:  Right lower quadrant abdominal pain    Rx / DC Orders ED Discharge Orders     None         Tedd Sias, [place] 08/19/21 1757    Fredia Sorrow, MD 08/25/21 [phone]

## 2021-08-19 NOTE — ED Notes (Signed)
Pt has a hernia that Dr. Lovell Sheehan is monitoring to right mid abd. Pt has not taken any pain meds for abd pain today.

## 2021-08-19 NOTE — ED Triage Notes (Addendum)
Pt c/o abd pain to RLQ with visible hernia showing. Pt states the hernia has become hard. Pt LBM was today and was diarrhea.

## 2021-08-19 NOTE — Discharge Instructions (Signed)
Your work-up today was quite reassuring.  Your CT scan was without any abnormal findings and certainly no hernias or abnormal findings in the right lower part of your abdomen.  You certainly have very real symptoms we did not, with an answer for your symptoms today.  This very well could be inflammation of the scar tissue over your appendectomy site.  I recommend cold compresses with ice, Tylenol and ibuprofen as discussed below and follow-up with your primary care provider  Please use Tylenol or ibuprofen for pain.  You may use 600 mg ibuprofen every 6 hours or 1000 mg of Tylenol every 6 hours.  You may choose to alternate between the 2.  This would be most effective.  Not to exceed 4 g of Tylenol within 24 hours.  Not to exceed 3200 mg ibuprofen 24 hours.

## 2021-08-23 ENCOUNTER — Ambulatory Visit: Payer: 59 | Admitting: Family Medicine

## 2021-08-23 ENCOUNTER — Encounter (HOSPITAL_COMMUNITY)
Admission: RE | Admit: 2021-08-23 | Discharge: 2021-08-23 | Disposition: A | Discharge status: Home / Self Care | Payer: 59 | Source: Ambulatory Visit | Attending: Gastroenterology | Admitting: Gastroenterology

## 2021-08-23 ENCOUNTER — Other Ambulatory Visit: Payer: Self-pay

## 2021-08-23 ENCOUNTER — Encounter (HOSPITAL_COMMUNITY): Payer: Self-pay

## 2021-08-24 ENCOUNTER — Encounter: Payer: Self-pay | Admitting: Family Medicine

## 2021-08-25 ENCOUNTER — Ambulatory Visit (HOSPITAL_COMMUNITY): Payer: 59 | Admitting: Anesthesiology

## 2021-08-25 ENCOUNTER — Ambulatory Visit (HOSPITAL_COMMUNITY)
Admission: RE | Admit: 2021-08-25 | Discharge: 2021-08-25 | Disposition: A | Discharge status: Home / Self Care | Payer: 59 | Attending: Gastroenterology | Admitting: Gastroenterology

## 2021-08-25 ENCOUNTER — Ambulatory Visit (HOSPITAL_BASED_OUTPATIENT_CLINIC_OR_DEPARTMENT_OTHER): Payer: 59 | Admitting: Anesthesiology

## 2021-08-25 ENCOUNTER — Encounter (HOSPITAL_COMMUNITY): Payer: Self-pay | Admitting: Gastroenterology

## 2021-08-25 ENCOUNTER — Encounter (HOSPITAL_COMMUNITY)
Admission: RE | Disposition: A | Discharge status: Home / Self Care | Payer: Self-pay | Source: Home / Self Care | Attending: Gastroenterology

## 2021-08-25 ENCOUNTER — Ambulatory Visit: Payer: 59 | Admitting: Family Medicine

## 2021-08-25 DIAGNOSIS — F320 Major depressive disorder, single episode, mild: Secondary | ICD-10-CM

## 2021-08-25 DIAGNOSIS — Z9989 Dependence on other enabling machines and devices: Secondary | ICD-10-CM | POA: Diagnosis not present

## 2021-08-25 DIAGNOSIS — G4730 Sleep apnea, unspecified: Secondary | ICD-10-CM | POA: Diagnosis not present

## 2021-08-25 DIAGNOSIS — K529 Noninfective gastroenteritis and colitis, unspecified: Secondary | ICD-10-CM

## 2021-08-25 DIAGNOSIS — K921 Melena: Secondary | ICD-10-CM

## 2021-08-25 DIAGNOSIS — R768 Other specified abnormal immunological findings in serum: Secondary | ICD-10-CM

## 2021-08-25 DIAGNOSIS — Z8673 Personal history of transient ischemic attack (TIA), and cerebral infarction without residual deficits: Secondary | ICD-10-CM | POA: Insufficient documentation

## 2021-08-25 DIAGNOSIS — J301 Allergic rhinitis due to pollen: Secondary | ICD-10-CM

## 2021-08-25 DIAGNOSIS — G4733 Obstructive sleep apnea (adult) (pediatric): Secondary | ICD-10-CM

## 2021-08-25 DIAGNOSIS — F445 Conversion disorder with seizures or convulsions: Secondary | ICD-10-CM

## 2021-08-25 DIAGNOSIS — K9089 Other intestinal malabsorption: Secondary | ICD-10-CM

## 2021-08-25 DIAGNOSIS — T1490XA Injury, unspecified, initial encounter: Secondary | ICD-10-CM

## 2021-08-25 DIAGNOSIS — G8929 Other chronic pain: Secondary | ICD-10-CM

## 2021-08-25 DIAGNOSIS — I10 Essential (primary) hypertension: Secondary | ICD-10-CM

## 2021-08-25 DIAGNOSIS — I609 Nontraumatic subarachnoid hemorrhage, unspecified: Secondary | ICD-10-CM

## 2021-08-25 DIAGNOSIS — R55 Syncope and collapse: Secondary | ICD-10-CM

## 2021-08-25 DIAGNOSIS — R519 Headache, unspecified: Secondary | ICD-10-CM | POA: Insufficient documentation

## 2021-08-25 DIAGNOSIS — S060X9A Concussion with loss of consciousness of unspecified duration, initial encounter: Secondary | ICD-10-CM

## 2021-08-25 DIAGNOSIS — J45909 Unspecified asthma, uncomplicated: Secondary | ICD-10-CM | POA: Insufficient documentation

## 2021-08-25 DIAGNOSIS — F411 Generalized anxiety disorder: Secondary | ICD-10-CM

## 2021-08-25 DIAGNOSIS — J189 Pneumonia, unspecified organism: Secondary | ICD-10-CM

## 2021-08-25 DIAGNOSIS — R454 Irritability and anger: Secondary | ICD-10-CM

## 2021-08-25 DIAGNOSIS — T07XXXA Unspecified multiple injuries, initial encounter: Secondary | ICD-10-CM

## 2021-08-25 DIAGNOSIS — R569 Unspecified convulsions: Secondary | ICD-10-CM | POA: Insufficient documentation

## 2021-08-25 DIAGNOSIS — F909 Attention-deficit hyperactivity disorder, unspecified type: Secondary | ICD-10-CM | POA: Diagnosis not present

## 2021-08-25 DIAGNOSIS — K219 Gastro-esophageal reflux disease without esophagitis: Secondary | ICD-10-CM | POA: Diagnosis not present

## 2021-08-25 DIAGNOSIS — Z6841 Body Mass Index (BMI) 40.0 and over, adult: Secondary | ICD-10-CM | POA: Insufficient documentation

## 2021-08-25 DIAGNOSIS — R911 Solitary pulmonary nodule: Secondary | ICD-10-CM

## 2021-08-25 DIAGNOSIS — R440 Auditory hallucinations: Secondary | ICD-10-CM

## 2021-08-25 DIAGNOSIS — Z8619 Personal history of other infectious and parasitic diseases: Secondary | ICD-10-CM

## 2021-08-25 DIAGNOSIS — A419 Sepsis, unspecified organism: Secondary | ICD-10-CM

## 2021-08-25 HISTORY — PX: COLONOSCOPY WITH PROPOFOL: SHX5780

## 2021-08-25 HISTORY — PX: BIOPSY: SHX5522

## 2021-08-25 LAB — HM COLONOSCOPY

## 2021-08-25 SURGERY — COLONOSCOPY WITH PROPOFOL
Anesthesia: General

## 2021-08-25 MED ORDER — LACTATED RINGERS IV SOLN: INTRAVENOUS | Status: DC

## 2021-08-25 MED ORDER — LIDOCAINE HCL (CARDIAC) PF 100 MG/5ML IV SOSY
PREFILLED_SYRINGE | INTRAVENOUS | Status: DC | PRN
  Administered 2021-08-25: 50 mg via INTRAVENOUS

## 2021-08-25 MED ORDER — LACTATED RINGERS IV SOLN: INTRAVENOUS | Status: DC | PRN

## 2021-08-25 MED ORDER — PROPOFOL 10 MG/ML IV BOLUS
INTRAVENOUS | Status: DC | PRN
  Administered 2021-08-25: 200 mg via INTRAVENOUS

## 2021-08-25 MED ORDER — PROPOFOL 500 MG/50ML IV EMUL
INTRAVENOUS | Status: DC | PRN
  Administered 2021-08-25: 200 ug/kg/min via INTRAVENOUS

## 2021-08-25 NOTE — Anesthesia Preprocedure Evaluation (Signed)
Anesthesia Evaluation  Patient identified by MRN, date of birth, ID band Patient awake    Reviewed: Allergy & Precautions, NPO status , Patient's Chart, lab work & pertinent test results, reviewed documented beta blocker date and time   Airway Mallampati: II  TM Distance: >3 FB Neck ROM: Full    Dental  (+) Teeth Intact, Dental Advisory Given   Pulmonary asthma , sleep apnea and Continuous Positive Airway Pressure Ventilation , pneumonia,    Pulmonary exam normal breath sounds clear to auscultation       Cardiovascular Exercise Tolerance: Good hypertension, Pt. on medications and Pt. on home beta blockers Normal cardiovascular exam Rhythm:Regular Rate:Normal     Neuro/Psych  Headaches, Seizures -, Well Controlled,  PSYCHIATRIC DISORDERS Anxiety Depression CVA, No Residual Symptoms    GI/Hepatic Neg liver ROS, GERD  Medicated and Controlled,  Endo/Other  Morbid obesity  Renal/GU negative Renal ROS  negative genitourinary   Musculoskeletal negative musculoskeletal ROS (+)   Abdominal   Peds  (+) ADHD Hematology negative hematology ROS (+)   Anesthesia Other Findings   Reproductive/Obstetrics negative OB ROS                             Anesthesia Physical Anesthesia Plan  ASA: 3  Anesthesia Plan: General   Post-op Pain Management: Minimal or no pain anticipated   Induction: Intravenous  PONV Risk Score and Plan: Propofol infusion  Airway Management Planned: Nasal Cannula, Natural Airway and Simple Face Mask  Additional Equipment:   Intra-op Plan:   Post-operative Plan:   Informed Consent: I have reviewed the patients History and Physical, chart, labs and discussed the procedure including the risks, benefits and alternatives for the proposed anesthesia with the patient or authorized representative who has indicated his/her understanding and acceptance.     Dental advisory  given  Plan Discussed with: CRNA and Surgeon  Anesthesia Plan Comments:         Anesthesia Quick Evaluation

## 2021-08-25 NOTE — Anesthesia Postprocedure Evaluation (Signed)
Anesthesia Post Note  Patient: Logan Dawson  Procedure(s) Performed: COLONOSCOPY WITH PROPOFOL BIOPSY  Patient location during evaluation: Phase II Anesthesia Type: General Level of consciousness: awake and alert and oriented Pain management: pain level controlled Vital Signs Assessment: post-procedure vital signs reviewed and stable Respiratory status: spontaneous breathing, nonlabored ventilation and respiratory function stable Cardiovascular status: blood pressure returned to baseline and stable Postop Assessment: no apparent nausea or vomiting Anesthetic complications: no   No notable events documented.   Last Vitals:  Vitals:   08/25/21 1315 08/25/21 1321  BP: (!) 100/59 113/71  Pulse: (!) 102 (!) 103  Resp: (!) 25 18  Temp:    SpO2: 94% 97%    Last Pain:  Vitals:   08/25/21 1321  TempSrc:   PainSc: 0-No pain                 Yaquelin Langelier C Soni Kegel

## 2021-08-25 NOTE — Op Note (Signed)
[facility] Patient Name: Logan Dawson Procedure Date: 08/25/2021 12:04 PM MRN: [id number] Date of Birth: Nov 30, 1995 Attending MD: Maylon Peppers ,  CSN: [id number] Age: 26 Admit Type: Outpatient Procedure:                Colonoscopy Indications:              Chronic diarrhea Providers:                Maylon Peppers, Janeece Riggers, RN, Everardo Pacific, Aram Candela Referring MD:              Medicines:                Monitored Anesthesia Care Complications:            No immediate complications. Estimated Blood Loss:     Estimated blood loss: none. Procedure:                Pre-Anesthesia Assessment:                           - Prior to the procedure, a History and Physical                            was performed, and patient medications, allergies                            and sensitivities were reviewed. The patient's                            tolerance of previous anesthesia was reviewed.                           - The risks and benefits of the procedure and the                            sedation options and risks were discussed with the                            patient. All questions were answered and informed                            consent was obtained.                           - ASA Grade Assessment: II - A patient with mild                            systemic disease.                           After obtaining informed consent, the colonoscope                            was passed under direct vision. Throughout the  procedure, the patient's blood pressure, pulse, and                            oxygen saturations were monitored continuously. The                            [id number]-HQ190L ([id number]) scope was introduced through                            the anus and advanced to the the cecum, identified                            by appendiceal orifice and ileocecal valve. The                             colonoscopy was performed without difficulty. The                            patient tolerated the procedure well. The quality                            of the bowel preparation was fair. Scope In: 12:45:41 PM Scope Out: 12:59:35 PM Scope Withdrawal Time: 0 hours 11 minutes 17 seconds  Total Procedure Duration: 0 hours 13 minutes 54 seconds  Findings:      The perianal and digital rectal examinations were normal.      The colon (entire examined portion) appeared normal. I attempted to do a       thorough evaluation of the colon as thorough as possible but given bowel       prep there are areas that could have small polyps or lesions. No       ifnlammatory mucosal changes, large masses or overt bleeding was seen.       Biopsies for histology were taken with a cold forceps from the right       colon and left colon for evaluation of microscopic colitis.      The retroflexed view of the distal rectum and anal verge was normal and       showed no anal or rectal abnormalities. Impression:               - Preparation of the colon was fair.                           - The entire examined colon is normal. Biopsied.                           - The distal rectum and anal verge are normal on                            retroflexion view. Moderate Sedation:      Per Anesthesia Care Recommendation:           - Discharge patient to home (ambulatory).                           - Resume previous diet.                           -  Await pathology results.                           - Continue present medications, including                            cholestyramine.                           - Repeat colonoscopy at age 74 for screening                            purposes. Procedure Code(s):        --- Professional ---                           [phone], Colonoscopy, flexible; with biopsy, single                            or multiple Diagnosis Code(s):        --- Professional ---                            K52.9, Noninfective gastroenteritis and colitis,                            unspecified CPT copyright 2019 American Medical Association. All rights reserved. The codes documented in this report are preliminary and upon coder review may  be revised to meet current compliance requirements. Katrinka Blazing, MD Katrinka Blazing,  08/25/2021 1:07:37 PM This report has been signed electronically. Number of Addenda: 0

## 2021-08-25 NOTE — Discharge Instructions (Signed)
You are being discharged to home.  Resume your previous diet.  We are waiting for your pathology results.  Continue your present medications, including cholestyramine. Your physician has recommended a repeat colonoscopy at age 26 (please contact the clinic prior to your 44th birthday to schedule) for screening purposes.

## 2021-08-25 NOTE — Transfer of Care (Signed)
Immediate Anesthesia Transfer of Care Note  Patient: Logan Dawson  Procedure(s) Performed: COLONOSCOPY WITH PROPOFOL BIOPSY  Patient Location: PACU  Anesthesia Type:General  Level of Consciousness: awake and drowsy  Airway & Oxygen Therapy: Patient Spontanous Breathing  Post-op Assessment: Report given to RN and Post -op Vital signs reviewed and stable  Post vital signs: Reviewed and stable  Last Vitals:  Vitals Value Taken Time  BP    Temp    Pulse 111 08/25/21 1310  Resp 12 08/25/21 1310  SpO2 95 % 08/25/21 1310  Vitals shown include unvalidated device data.  Last Pain:  Vitals:   08/25/21 1020  TempSrc: Oral  PainSc: 0-No pain         Complications: No notable events documented.

## 2021-08-25 NOTE — H&P (Signed)
Logan Dawson is an 26 y.o. male.   Chief Complaint: chronic diarrhea HPI: Logan Dawson is a 26 y.o. male with PMH ADHD, bile salt diarrhea, HTN, meningitis, Asthma and H. Pylori s/p eradication, who presents for follow up of chronic diarrhea.  Patient reported after he started cholestyramine his diarrhea has much improved.  He denies any nausea, vomiting, fever, chills, minimal distention and abdominal pain.  Past Medical History:  Diagnosis Date   ADHD (attention deficit hyperactivity disorder)    Anxiety    Asthma    Gastroenteritis    Hypertension    Left shoulder pain    Meningitis    Migraine    after skull fracture   Skull fracture (HCC)    Subarachnoid hemorrhage (HCC)     Past Surgical History:  Procedure Laterality Date   APPENDECTOMY     BIOPSY  08/16/2020   Procedure: BIOPSY;  Surgeon: Dolores Frame, MD;  Location: [facility];  Service: Gastroenterology;;   CHOLECYSTECTOMY     ESOPHAGOGASTRODUODENOSCOPY (EGD) WITH PROPOFOL N/A 08/16/2020   Levon Hedger, H pylori positive   HERNIA REPAIR      Family History  Problem Relation Age of Onset   Diabetes Mother    COPD Mother    Diabetes Father    Hypertension Father    Heart disease Father    Social History:  reports that he has never smoked. He has never used smokeless tobacco. He reports that he does not currently use alcohol. He reports that he does not use drugs.  Allergies:  Allergies  Allergen Reactions   Lisinopril Other (See Comments)    elevated BP   Sulfa Antibiotics Hives    Medications Prior to Admission  Medication Sig Dispense Refill   albuterol (VENTOLIN HFA) 108 (90 Base) MCG/ACT inhaler Inhale 1 puff into the lungs every 4 (four) hours as needed for wheezing or shortness of breath (cough). (Patient taking differently: Inhale 2 puffs into the lungs every 4 (four) hours as needed for wheezing or shortness of breath (cough).) 8 g 2   aspirin-acetaminophen-caffeine (EXCEDRIN  MIGRAINE) 250-250-65 MG tablet Take 2 tablets by mouth every 6 (six) hours as needed for headache.     cholestyramine (QUESTRAN) 4 g packet Take 1 packet (4 g total) by mouth 3 (three) times daily with meals. (Patient taking differently: Take 4 g by mouth daily.) 60 each 12   FLUoxetine (PROZAC) 20 MG capsule Take 1 capsule (20 mg total) by mouth daily. 90 capsule 3   fluticasone (FLONASE) 50 MCG/ACT nasal spray Place 2 sprays into both nostrils daily. (Patient taking differently: Place 2 sprays into both nostrils daily as needed for allergies.) 16 g 6   levocetirizine (XYZAL) 5 MG tablet Take 1 tablet (5 mg total) by mouth every evening. 30 tablet 11   meloxicam (MOBIC) 7.5 MG tablet Take 7.5 mg by mouth 2 (two) times daily as needed for pain.     methocarbamol (ROBAXIN) 500 MG tablet Take 500 mg by mouth 2 (two) times daily.     metoprolol succinate (TOPROL-XL) 50 MG 24 hr tablet Take 2 tablets (100 mg total) by mouth daily. Take with or immediately following a meal. (Patient taking differently: Take 50 mg by mouth daily. Take with or immediately following a meal.) 180 tablet 1   pantoprazole (PROTONIX) 40 MG tablet Take 40 mg by mouth daily.     guaiFENesin (TUSSIN) 100 MG/5ML liquid Take 5 mLs by mouth every 4 (four) hours as  needed for cough or to loosen phlegm. (Patient not taking: Reported on 08/16/2021) 120 mL 0   pantoprazole (PROTONIX) 20 MG tablet Take 1 tablet (20 mg total) by mouth 2 (two) times daily. (Patient not taking: Reported on 08/16/2021) 60 tablet 0   polyethylene glycol-electrolytes (TRILYTE) 420 g solution Take 4,000 mLs by mouth as directed. (Patient not taking: Reported on 08/19/2021) 4000 mL 0    No results found for this or any previous visit (from the past 48 hour(s)). No results found.  Review of Systems  All other systems reviewed and are negative.   Blood pressure 130/86, pulse 98, temperature 98.2 F (36.8 C), temperature source Oral, resp. rate 15, height 6'  (1.829 m), weight (!) 144.8 kg, SpO2 98 %. Physical Exam  GENERAL: The patient is AO x3, in no acute distress. HEENT: Head is normocephalic and atraumatic. EOMI are intact. Mouth is well hydrated and without lesions. NECK: Supple. No masses LUNGS: Clear to auscultation. No presence of rhonchi/wheezing/rales. Adequate chest expansion HEART: RRR, normal s1 and s2. ABDOMEN: Soft, nontender, no guarding, no peritoneal signs, and nondistended. BS +. No masses. EXTREMITIES: Without any cyanosis, clubbing, rash, lesions or edema. NEUROLOGIC: AOx3, no focal motor deficit. SKIN: no jaundice, no rashes\ Assessment/Plan Logan Dawson is a 26 y.o. male with PMH ADHD, bile salt diarrhea, HTN, meningitis, Asthma and H. Pylori s/p eradication, who presents for follow up of chronic diarrhea.  We will proceed with colonoscopy.  Dolores Frame, MD 08/25/2021, 11:15 AM

## 2021-08-28 ENCOUNTER — Encounter (INDEPENDENT_AMBULATORY_CARE_PROVIDER_SITE_OTHER): Payer: Self-pay | Admitting: *Deleted

## 2021-08-28 LAB — SURGICAL PATHOLOGY

## 2021-08-30 DIAGNOSIS — J020 Streptococcal pharyngitis: Secondary | ICD-10-CM | POA: Diagnosis not present

## 2021-08-30 DIAGNOSIS — Z20822 Contact with and (suspected) exposure to covid-19: Secondary | ICD-10-CM | POA: Diagnosis not present

## 2021-08-31 ENCOUNTER — Encounter (INDEPENDENT_AMBULATORY_CARE_PROVIDER_SITE_OTHER): Payer: Self-pay | Admitting: *Deleted

## 2021-08-31 ENCOUNTER — Encounter (HOSPITAL_COMMUNITY): Payer: Self-pay | Admitting: Gastroenterology

## 2021-09-06 NOTE — Progress Notes (Deleted)
PATIENT: Logan Dawson DOB: Apr 07, 1995  REASON FOR VISIT: follow up HISTORY FROM: patient  No chief complaint on file.    HISTORY OF PRESENT ILLNESS:  09/06/21 ALL:  Logan Dawson is a 26 y.o. male here today for follow up for OSA on CPAP.  He was seen in consult with Dr Rexene Alberts 05/2021 for snoring and witnessed apneic events. Split night study 06/18/2021 showed AHI 23.6/hr and O2 nadir 86%. PAP therapy started and apnea resolved with set pressure of 12cm H20.     HISTORY: (copied from Dr Guadelupe Sabin previous note)  Dear Logan Dawson,    I saw your patient, Logan Dawson, upon your kind request in my sleep clinic today for initial consultation of his sleep disorder, in particular, concern for underlying obstructive sleep apnea.  The patient is unaccompanied today.  As you know, Logan Dawson is a 26 year old right-handed gentleman with an underlying medical history of asthma, history of meningitis, history of subarachnoid hemorrhage and skull fracture in the remote past, history of hypertension, ADHD, and severe obesity with a BMI of over 40, who reports snoring and excessive daytime somnolence, as well as witnessed apneas per girlfriend.  I reviewed your office note from 05/04/2021.  His Epworth sleepiness score is 21 out of 94, fatigue severity score is 53 out of 63.  He has been sleepy during the day for the past few years.  He may sleep 8 or 10 hours but does not wake up rested.  He denies recurrent morning headaches but has woken up with a sense of gasping for air and with nighttime reflux symptoms.  He has sleep disruption from nocturia, about 5 or 6 times per average night.  He does not drink any alcohol, he is a non-smoker.  He has caffeine in the form of soda and sweet tea about 5 or 6 servings per day on average.  He lives alone, he is a 53-year-old daughter and she stays with him every other week.  He works as a Sports coach for [employer].  He has an uncle with sleep apnea and a  cousin with sleep apnea.  He has had weight gain over time, he is currently working on weight loss, he is currently not on Ozempic.  His bedtime is generally around 8 PM and rise time between 6 and 7 AM.   REVIEW OF SYSTEMS: Out of a complete 14 system review of symptoms, the patient complains only of the following symptoms, and all other reviewed systems are negative.  ESS:  ALLERGIES: Allergies  Allergen Reactions   Lisinopril Other (See Comments)    elevated BP   Sulfa Antibiotics Hives    HOME MEDICATIONS: Outpatient Medications Prior to Visit  Medication Sig Dispense Refill   albuterol (VENTOLIN HFA) 108 (90 Base) MCG/ACT inhaler Inhale 1 puff into the lungs every 4 (four) hours as needed for wheezing or shortness of breath (cough). (Patient taking differently: Inhale 2 puffs into the lungs every 4 (four) hours as needed for wheezing or shortness of breath (cough).) 8 g 2   aspirin-acetaminophen-caffeine (EXCEDRIN MIGRAINE) 250-250-65 MG tablet Take 2 tablets by mouth every 6 (six) hours as needed for headache.     cholestyramine (QUESTRAN) 4 g packet Take 1 packet (4 g total) by mouth 3 (three) times daily with meals. (Patient taking differently: Take 4 g by mouth daily.) 60 each 12   FLUoxetine (PROZAC) 20 MG capsule Take 1 capsule (20 mg total) by mouth daily. 90 capsule  3   fluticasone (FLONASE) 50 MCG/ACT nasal spray Place 2 sprays into both nostrils daily. (Patient taking differently: Place 2 sprays into both nostrils daily as needed for allergies.) 16 g 6   guaiFENesin (TUSSIN) 100 MG/5ML liquid Take 5 mLs by mouth every 4 (four) hours as needed for cough or to loosen phlegm. (Patient not taking: Reported on 08/16/2021) 120 mL 0   levocetirizine (XYZAL) 5 MG tablet Take 1 tablet (5 mg total) by mouth every evening. 30 tablet 11   meloxicam (MOBIC) 7.5 MG tablet Take 7.5 mg by mouth 2 (two) times daily as needed for pain.     methocarbamol (ROBAXIN) 500 MG tablet Take 500 mg by  mouth 2 (two) times daily.     metoprolol succinate (TOPROL-XL) 50 MG 24 hr tablet Take 2 tablets (100 mg total) by mouth daily. Take with or immediately following a meal. (Patient taking differently: Take 50 mg by mouth daily. Take with or immediately following a meal.) 180 tablet 1   pantoprazole (PROTONIX) 20 MG tablet Take 1 tablet (20 mg total) by mouth 2 (two) times daily. (Patient not taking: Reported on 08/16/2021) 60 tablet 0   pantoprazole (PROTONIX) 40 MG tablet Take 40 mg by mouth daily.     No facility-administered medications prior to visit.    PAST MEDICAL HISTORY: Past Medical History:  Diagnosis Date   ADHD (attention deficit hyperactivity disorder)    Anxiety    Asthma    Gastroenteritis    Hypertension    Left shoulder pain    Meningitis    Migraine    after skull fracture   Skull fracture (HCC)    Subarachnoid hemorrhage (HCC)     PAST SURGICAL HISTORY: Past Surgical History:  Procedure Laterality Date   APPENDECTOMY     BIOPSY  08/16/2020   Procedure: BIOPSY;  Surgeon: Dolores Frame, MD;  [facility]: [facility];  Service: Gastroenterology;;   BIOPSY  08/25/2021   Procedure: BIOPSY;  Surgeon: Dolores Frame, MD;  [facility];  Service: Gastroenterology;;   CHOLECYSTECTOMY     COLONOSCOPY WITH PROPOFOL N/A 08/25/2021   Procedure: COLONOSCOPY WITH PROPOFOL;  Surgeon: Dolores Frame, MD;  [facility];  Service: Gastroenterology;  Laterality: N/A;  12:30 ASA 1   ESOPHAGOGASTRODUODENOSCOPY (EGD) WITH PROPOFOL N/A 08/16/2020   Levon Hedger, H pylori positive   HERNIA REPAIR      FAMILY HISTORY: Family History  Problem Relation Age of Onset   Diabetes Mother    COPD Mother    Diabetes Father    Hypertension Father    Heart disease Father     SOCIAL HISTORY: Social History   Socioeconomic History   Marital status: Single    Spouse name: Not on file   Number of children: Not on file   Years of  education: Not on file   Highest education level: Not on file  Occupational History   Not on file  Tobacco Use   Smoking status: Never   Smokeless tobacco: Never  Vaping Use   Vaping Use: Never used  Substance and Sexual Activity   Alcohol use: Not Currently   Drug use: No   Sexual activity: Not Currently  Other Topics Concern   Not on file  Social History Narrative   Lives alone   Left handed   Caffeine: 8 sodas per day 12 oz each    Social Determinants of Health   Financial Resource Strain: Not on file  Food Insecurity: Not on file  Transportation Needs: Not on file  Physical Activity: Not on file  Stress: Not on file  Social Connections: Not on file  Intimate Partner Violence: Not on file     PHYSICAL EXAM  There were no vitals filed for this visit. There is no height or weight on file to calculate BMI.  Generalized: Well developed, in no acute distress  Cardiology: normal rate and rhythm, no murmur noted Respiratory: clear to auscultation bilaterally  Neurological examination  Mentation: Alert oriented to time, place, history taking. Follows all commands speech and language fluent Cranial nerve II-XII: Pupils were equal round reactive to light. Extraocular movements were full, visual field were full  Motor: The motor testing reveals 5 over 5 strength of all 4 extremities. Good symmetric motor tone is noted throughout.  Gait and station: Gait is normal.    DIAGNOSTIC DATA (LABS, IMAGING, TESTING) - I reviewed patient records, labs, notes, testing and imaging myself where available.      No data to display           Lab Results  Component Value Date   WBC 10.1 08/19/2021   HGB 15.5 08/19/2021   HCT 44.4 08/19/2021   MCV 86.2 08/19/2021   PLT 292 08/19/2021      Component Value Date/Time   NA 140 08/19/2021 1428   K 4.0 08/19/2021 1428   CL 104 08/19/2021 1428   CO2 28 08/19/2021 1428   GLUCOSE 94 08/19/2021 1428   BUN 14 08/19/2021 1428    CREATININE 0.83 08/19/2021 1428   CALCIUM 9.1 08/19/2021 1428   PROT 7.6 08/19/2021 1428   ALBUMIN 4.1 08/19/2021 1428   AST 82 (H) 08/19/2021 1428   ALT 57 (H) 08/19/2021 1428   ALKPHOS 59 08/19/2021 1428   BILITOT 0.7 08/19/2021 1428   GFRNONAA >60 08/19/2021 1428   GFRAA >60 08/12/2019 0451   No results found for: "CHOL", "HDL", "LDLCALC", "LDLDIRECT", "TRIG", "CHOLHDL" No results found for: "HGBA1C" No results found for: "VITAMINB12" Lab Results  Component Value Date   TSH 1.642 10/21/2018     ASSESSMENT AND PLAN 26 y.o. year old male  has a past medical history of ADHD (attention deficit hyperactivity disorder), Anxiety, Asthma, Gastroenteritis, Hypertension, Left shoulder pain, Meningitis, Migraine, Skull fracture (HCC), and Subarachnoid hemorrhage (HCC). here with   No diagnosis found.    Logan Dawson is  Compliance report reveals sub optimal compliance. He was encouraged to continue using CPAP nightly and for greater than 4 hours each night. We will update supply orders as indicated. Risks of untreated sleep apnea review and education materials provided. Healthy lifestyle habits encouraged. He will follow up in 6 months, sooner if needed. He verbalizes understanding and agreement with this plan.    No orders of the defined types were placed in this encounter.    No orders of the defined types were placed in this encounter.     Shawnie Dapper, FNP-C 09/06/2021, 12:32 PM [facility] [address] [place], [place] [address] [phone]

## 2021-09-07 ENCOUNTER — Encounter: Payer: Self-pay | Admitting: Family Medicine

## 2021-09-07 ENCOUNTER — Ambulatory Visit: Payer: 59 | Admitting: Family Medicine

## 2021-09-07 DIAGNOSIS — G4733 Obstructive sleep apnea (adult) (pediatric): Secondary | ICD-10-CM

## 2021-09-19 DIAGNOSIS — G4733 Obstructive sleep apnea (adult) (pediatric): Secondary | ICD-10-CM | POA: Diagnosis not present

## 2021-09-19 DIAGNOSIS — M25562 Pain in left knee: Secondary | ICD-10-CM | POA: Diagnosis not present

## 2021-09-19 DIAGNOSIS — L309 Dermatitis, unspecified: Secondary | ICD-10-CM | POA: Diagnosis not present

## 2021-10-01 DIAGNOSIS — J189 Pneumonia, unspecified organism: Secondary | ICD-10-CM | POA: Diagnosis not present

## 2021-10-01 DIAGNOSIS — R079 Chest pain, unspecified: Secondary | ICD-10-CM | POA: Diagnosis not present

## 2021-10-01 DIAGNOSIS — R059 Cough, unspecified: Secondary | ICD-10-CM | POA: Diagnosis not present

## 2021-10-01 DIAGNOSIS — Z20822 Contact with and (suspected) exposure to covid-19: Secondary | ICD-10-CM | POA: Diagnosis not present

## 2021-10-01 DIAGNOSIS — E876 Hypokalemia: Secondary | ICD-10-CM | POA: Diagnosis not present

## 2021-10-03 ENCOUNTER — Ambulatory Visit: Payer: 59 | Admitting: General Surgery

## 2021-10-17 ENCOUNTER — Emergency Department (HOSPITAL_COMMUNITY): Payer: 59

## 2021-10-17 ENCOUNTER — Encounter (HOSPITAL_COMMUNITY): Payer: Self-pay | Admitting: Emergency Medicine

## 2021-10-17 ENCOUNTER — Emergency Department (HOSPITAL_COMMUNITY)
Admission: EM | Admit: 2021-10-17 | Discharge: 2021-10-17 | Disposition: A | Discharge status: Home / Self Care | Payer: 59 | Attending: Emergency Medicine | Admitting: Emergency Medicine

## 2021-10-17 ENCOUNTER — Other Ambulatory Visit: Payer: Self-pay

## 2021-10-17 DIAGNOSIS — R519 Headache, unspecified: Secondary | ICD-10-CM | POA: Diagnosis not present

## 2021-10-17 DIAGNOSIS — R42 Dizziness and giddiness: Secondary | ICD-10-CM | POA: Diagnosis not present

## 2021-10-17 DIAGNOSIS — M542 Cervicalgia: Secondary | ICD-10-CM | POA: Insufficient documentation

## 2021-10-17 LAB — CBC WITH DIFFERENTIAL/PLATELET
Abs Immature Granulocytes: 0.02 10*3/uL (ref 0.00–0.07)
Basophils Absolute: 0 10*3/uL (ref 0.0–0.1)
Basophils Relative: 0 %
Eosinophils Absolute: 0.3 10*3/uL (ref 0.0–0.5)
Eosinophils Relative: 3 %
HCT: 42.7 % (ref 39.0–52.0)
Hemoglobin: 15 g/dL (ref 13.0–17.0)
Immature Granulocytes: 0 %
Lymphocytes Relative: 31 %
Lymphs Abs: 3.4 10*3/uL (ref 0.7–4.0)
MCH: 30.3 pg (ref 26.0–34.0)
MCHC: 35.1 g/dL (ref 30.0–36.0)
MCV: 86.3 fL (ref 80.0–100.0)
Monocytes Absolute: 0.9 10*3/uL (ref 0.1–1.0)
Monocytes Relative: 8 %
Neutro Abs: 6.2 10*3/uL (ref 1.7–7.7)
Neutrophils Relative %: 58 %
Platelets: 278 10*3/uL (ref 150–400)
RBC: 4.95 MIL/uL (ref 4.22–5.81)
RDW: 12.7 % (ref 11.5–15.5)
WBC: 10.8 10*3/uL — ABNORMAL HIGH (ref 4.0–10.5)
nRBC: 0 % (ref 0.0–0.2)

## 2021-10-17 LAB — COMPREHENSIVE METABOLIC PANEL
ALT: 40 U/L (ref 0–44)
AST: 74 U/L — ABNORMAL HIGH (ref 15–41)
Albumin: 3.8 g/dL (ref 3.5–5.0)
Alkaline Phosphatase: 70 U/L (ref 38–126)
Anion gap: 10 (ref 5–15)
BUN: 8 mg/dL (ref 6–20)
CO2: 28 mmol/L (ref 22–32)
Calcium: 9.5 mg/dL (ref 8.9–10.3)
Chloride: 102 mmol/L (ref 98–111)
Creatinine, Ser: 0.94 mg/dL (ref 0.61–1.24)
GFR, Estimated: >60 mL/min (ref >60)
Glucose, Bld: 102 mg/dL — ABNORMAL HIGH (ref 70–99)
Potassium: 3.9 mmol/L (ref 3.5–5.1)
Sodium: 140 mmol/L (ref 135–145)
Total Bilirubin: 0.4 mg/dL (ref 0.3–1.2)
Total Protein: 7.1 g/dL (ref 6.5–8.1)

## 2021-10-17 LAB — URINALYSIS, ROUTINE W REFLEX MICROSCOPIC
Bacteria, UA: NONE SEEN
Bilirubin Urine: NEGATIVE
Glucose, UA: NEGATIVE mg/dL
Ketones, ur: NEGATIVE mg/dL
Leukocytes,Ua: NEGATIVE
Nitrite: NEGATIVE
Protein, ur: NEGATIVE mg/dL
Specific Gravity, Urine: 1.029 (ref 1.005–1.030)
pH: 6 (ref 5.0–8.0)

## 2021-10-17 MED ORDER — ONDANSETRON HCL 4 MG/2ML IJ SOLN
4.0000 mg | Freq: Once | INTRAMUSCULAR | Status: AC
Start: 2021-10-17 — End: 2021-10-17
  Administered 2021-10-17: 4 mg via INTRAVENOUS
  Filled 2021-10-17: qty 2

## 2021-10-17 MED ORDER — KETOROLAC TROMETHAMINE 15 MG/ML IJ SOLN
15.0000 mg | Freq: Once | INTRAMUSCULAR | Status: AC
Start: 2021-10-17 — End: 2021-10-17
  Administered 2021-10-17: 15 mg via INTRAVENOUS
  Filled 2021-10-17: qty 1

## 2021-10-17 MED ORDER — DIPHENHYDRAMINE HCL 50 MG/ML IJ SOLN
12.5000 mg | Freq: Once | INTRAMUSCULAR | Status: AC
Start: 2021-10-17 — End: 2021-10-17
  Administered 2021-10-17: 12.5 mg via INTRAVENOUS
  Filled 2021-10-17: qty 1

## 2021-10-17 NOTE — ED Provider Triage Note (Signed)
Emergency Medicine Provider Triage Evaluation Note  Logan Dawson , a 26 y.o. male  was evaluated in triage.  Pt complains of room spinning dizziness occurred 1 week ago while walking at work.  Also left-sided neck pain with a bump on the side of his neck, unsure if this is a spider bite.  Also dysuria History of skull fracture with subdural bleed in 2015 when he fell out of a moving truck Review of Systems  Positive: Dizziness, dysuria, neck pain Negative: Fever, vomiting  Physical Exam  BP (!) 142/94 (BP Location: Right Arm)   Pulse (!) 117   Temp 98.9 F (37.2 C) (Oral)   Resp 18   SpO2 96%  Gen:   Awake, no distress   Resp:  Normal effort  MSK:   Moves extremities without difficulty  Other:  Gait intact  Medical Decision Making  Medically screening exam initiated at 6:38 PM.  Appropriate orders placed.  AREN Dawson was informed that the remainder of the evaluation will be completed by another provider, this initial triage assessment does not replace that evaluation, and the importance of remaining in the ED until their evaluation is complete.     Logan Fend, PA-C 10/17/21 Logan Dawson

## 2021-10-17 NOTE — Discharge Instructions (Signed)
Your head CT was normal today.  Your lab work and urine sample are also normal.  I believe you are experiencing more frequent migraine headaches and need to follow-up with your PCP.  You should tell her that you got that CT that she ordered a couple of months back and that it was normal.  She should be able to see these results.  Please return with any worsening symptoms.  Especially blurred vision, acute onset headache, facial droop, word slurring or weakness.  You may continue to take Excedrin and Tylenol for migraines and you may discuss potential daily treatment with your PCP.  It was a pleasure to meet you and we hope that you feel better!
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
## 2021-10-17 NOTE — ED Triage Notes (Signed)
Patient complains of left neck pain that started several days ago and intermittent headaches. Patient also reports having pneumonia two weeks ago. Patient is alert, oriented, ambulatory, and in no apparent distress at this time.

## 2021-10-17 NOTE — ED Notes (Signed)
Patient verbalizes understanding of discharge instructions. Opportunity for questioning and answers were provided. Armband removed by staff, pt discharged from ED. Pt ambulatory to ED waiting room with steady gait.  

## 2021-10-20 DIAGNOSIS — G4733 Obstructive sleep apnea (adult) (pediatric): Secondary | ICD-10-CM | POA: Diagnosis not present

## 2021-11-09 DIAGNOSIS — M62830 Muscle spasm of back: Secondary | ICD-10-CM | POA: Diagnosis not present

## 2021-11-09 DIAGNOSIS — M9903 Segmental and somatic dysfunction of lumbar region: Secondary | ICD-10-CM | POA: Diagnosis not present

## 2021-11-09 DIAGNOSIS — M9902 Segmental and somatic dysfunction of thoracic region: Secondary | ICD-10-CM | POA: Diagnosis not present

## 2021-11-09 DIAGNOSIS — M9901 Segmental and somatic dysfunction of cervical region: Secondary | ICD-10-CM | POA: Diagnosis not present

## 2021-11-15 DIAGNOSIS — M9903 Segmental and somatic dysfunction of lumbar region: Secondary | ICD-10-CM | POA: Diagnosis not present

## 2021-11-15 DIAGNOSIS — M9902 Segmental and somatic dysfunction of thoracic region: Secondary | ICD-10-CM | POA: Diagnosis not present

## 2021-11-15 DIAGNOSIS — M62830 Muscle spasm of back: Secondary | ICD-10-CM | POA: Diagnosis not present

## 2021-11-15 DIAGNOSIS — M9901 Segmental and somatic dysfunction of cervical region: Secondary | ICD-10-CM | POA: Diagnosis not present

## 2021-11-23 ENCOUNTER — Ambulatory Visit: Payer: Self-pay | Admitting: Surgery

## 2021-11-23 DIAGNOSIS — R1031 Right lower quadrant pain: Secondary | ICD-10-CM | POA: Diagnosis not present

## 2021-11-28 ENCOUNTER — Ambulatory Visit: Payer: Self-pay | Admitting: General Surgery

## 2021-11-28 ENCOUNTER — Ambulatory Visit: Payer: 59 | Admitting: General Surgery

## 2021-12-05 DIAGNOSIS — Z713 Dietary counseling and surveillance: Secondary | ICD-10-CM | POA: Diagnosis not present

## 2021-12-05 DIAGNOSIS — G4733 Obstructive sleep apnea (adult) (pediatric): Secondary | ICD-10-CM | POA: Diagnosis not present

## 2021-12-05 DIAGNOSIS — Z7182 Exercise counseling: Secondary | ICD-10-CM | POA: Diagnosis not present

## 2021-12-05 DIAGNOSIS — Z79899 Other long term (current) drug therapy: Secondary | ICD-10-CM | POA: Diagnosis not present

## 2021-12-05 DIAGNOSIS — J4520 Mild intermittent asthma, uncomplicated: Secondary | ICD-10-CM | POA: Diagnosis not present

## 2021-12-05 DIAGNOSIS — M549 Dorsalgia, unspecified: Secondary | ICD-10-CM | POA: Diagnosis not present

## 2021-12-05 DIAGNOSIS — R197 Diarrhea, unspecified: Secondary | ICD-10-CM | POA: Diagnosis not present

## 2021-12-05 DIAGNOSIS — E559 Vitamin D deficiency, unspecified: Secondary | ICD-10-CM | POA: Diagnosis not present

## 2021-12-05 DIAGNOSIS — R69 Illness, unspecified: Secondary | ICD-10-CM | POA: Diagnosis not present

## 2021-12-05 DIAGNOSIS — I10 Essential (primary) hypertension: Secondary | ICD-10-CM | POA: Diagnosis not present

## 2021-12-06 ENCOUNTER — Telehealth: Payer: Self-pay | Admitting: Gastroenterology

## 2021-12-06 NOTE — Telephone Encounter (Signed)
Good Morning Dr.Mansouraty,  Supervising MD 10/31 PM,  We received a referral on this patient to be seen for IBS and chronic diarrhea. He has been seen at [facility]. He states they have done all they could for his care and would like a second opinion.  Records are available for review in Epic, please advise on scheduling. Thank you.

## 2021-12-06 NOTE — Telephone Encounter (Signed)
Request received to transfer GI care from outside practice to [place] [facility].  Recommend, patient reach out to previous GI, to be seen in follow up and see if additional workup may be helps.  We appreciate the interest in our practice, however at this time due to high demand from patients without established GI providers I cannot accommodate this transfer.  Ability to accommodate future transfer requests may change over time and the patient can contact us again in 6 months if still interested in being seen at [facility].

## 2021-12-07 NOTE — Telephone Encounter (Signed)
I called patient to made him aware of the your recommendations.

## 2021-12-18 ENCOUNTER — Encounter: Payer: Self-pay | Admitting: Physical Medicine & Rehabilitation

## 2022-01-09 DIAGNOSIS — R1084 Generalized abdominal pain: Secondary | ICD-10-CM | POA: Diagnosis not present

## 2022-01-09 DIAGNOSIS — G8929 Other chronic pain: Secondary | ICD-10-CM | POA: Diagnosis not present

## 2022-01-09 DIAGNOSIS — Z79899 Other long term (current) drug therapy: Secondary | ICD-10-CM | POA: Diagnosis not present

## 2022-02-09 DIAGNOSIS — M545 Low back pain, unspecified: Secondary | ICD-10-CM | POA: Diagnosis not present

## 2022-02-13 DIAGNOSIS — K5730 Diverticulosis of large intestine without perforation or abscess without bleeding: Secondary | ICD-10-CM | POA: Diagnosis not present

## 2022-02-13 DIAGNOSIS — M545 Low back pain, unspecified: Secondary | ICD-10-CM | POA: Diagnosis not present

## 2022-02-13 DIAGNOSIS — R109 Unspecified abdominal pain: Secondary | ICD-10-CM | POA: Diagnosis not present

## 2022-02-13 DIAGNOSIS — Z882 Allergy status to sulfonamides status: Secondary | ICD-10-CM | POA: Diagnosis not present

## 2022-02-13 DIAGNOSIS — G8929 Other chronic pain: Secondary | ICD-10-CM | POA: Diagnosis not present

## 2022-02-13 DIAGNOSIS — K760 Fatty (change of) liver, not elsewhere classified: Secondary | ICD-10-CM | POA: Diagnosis not present

## 2022-02-13 DIAGNOSIS — I10 Essential (primary) hypertension: Secondary | ICD-10-CM | POA: Diagnosis not present

## 2022-02-17 DIAGNOSIS — M25511 Pain in right shoulder: Secondary | ICD-10-CM | POA: Diagnosis not present

## 2022-02-17 DIAGNOSIS — Z79899 Other long term (current) drug therapy: Secondary | ICD-10-CM | POA: Diagnosis not present

## 2022-02-17 DIAGNOSIS — N529 Male erectile dysfunction, unspecified: Secondary | ICD-10-CM | POA: Diagnosis not present

## 2022-02-17 DIAGNOSIS — I10 Essential (primary) hypertension: Secondary | ICD-10-CM | POA: Diagnosis not present

## 2022-02-17 DIAGNOSIS — Z882 Allergy status to sulfonamides status: Secondary | ICD-10-CM | POA: Diagnosis not present

## 2022-02-17 DIAGNOSIS — K219 Gastro-esophageal reflux disease without esophagitis: Secondary | ICD-10-CM | POA: Diagnosis not present

## 2022-02-21 ENCOUNTER — Encounter: Payer: 59 | Attending: Physical Medicine & Rehabilitation | Admitting: Physical Medicine & Rehabilitation

## 2022-02-21 ENCOUNTER — Encounter: Payer: Self-pay | Admitting: Physical Medicine & Rehabilitation

## 2022-02-21 VITALS — BP 129/90 | HR 114 | Ht 72.0 in | Wt 334.0 lb

## 2022-02-21 DIAGNOSIS — Z5181 Encounter for therapeutic drug level monitoring: Secondary | ICD-10-CM

## 2022-02-21 DIAGNOSIS — Z79891 Long term (current) use of opiate analgesic: Secondary | ICD-10-CM | POA: Diagnosis not present

## 2022-02-21 DIAGNOSIS — M47816 Spondylosis without myelopathy or radiculopathy, lumbar region: Secondary | ICD-10-CM | POA: Insufficient documentation

## 2022-02-21 DIAGNOSIS — G894 Chronic pain syndrome: Secondary | ICD-10-CM | POA: Diagnosis not present

## 2022-02-21 MED ORDER — METHOCARBAMOL 500 MG PO TABS: 500.0000 mg | ORAL_TABLET | Freq: Four times a day (QID) | ORAL | 2 refills | Status: DC | PRN

## 2022-02-21 NOTE — Patient Instructions (Signed)
ALWAYS FEEL FREE TO CALL OUR OFFICE WITH ANY PROBLEMS OR QUESTIONS ([phone])  **PLEASE NOTE** ALL MEDICATION REFILL REQUESTS (INCLUDING CONTROLLED SUBSTANCES) NEED TO BE MADE AT LEAST 7 DAYS PRIOR TO REFILL BEING DUE. ANY REFILL REQUESTS INSIDE THAT TIME FRAME MAY RESULT IN DELAYS IN RECEIVING YOUR PRESCRIPTION.                    
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
## 2022-02-25 LAB — TOXASSURE SELECT,+ANTIDEPR,UR

## 2022-02-26 DIAGNOSIS — M7551 Bursitis of right shoulder: Secondary | ICD-10-CM | POA: Diagnosis not present

## 2022-02-26 DIAGNOSIS — M778 Other enthesopathies, not elsewhere classified: Secondary | ICD-10-CM | POA: Diagnosis not present

## 2022-03-05 ENCOUNTER — Telehealth: Payer: Self-pay | Admitting: Neurology

## 2022-03-05 NOTE — Telephone Encounter (Signed)
Pt is calling. Stated he needs a new prescription and office notes sent to [facility] for his CPAP supplies.Please Faxed Number: [phone]

## 2022-03-05 NOTE — Telephone Encounter (Signed)
Cpap order, office note, and sleep study faxed to [facility]. Received a receipt of confirmation.

## 2022-03-26 ENCOUNTER — Other Ambulatory Visit: Payer: Self-pay

## 2022-03-26 ENCOUNTER — Emergency Department (HOSPITAL_COMMUNITY): Payer: 59

## 2022-03-26 ENCOUNTER — Encounter (HOSPITAL_COMMUNITY): Payer: Self-pay | Admitting: Radiology

## 2022-03-26 ENCOUNTER — Emergency Department (HOSPITAL_COMMUNITY)
Admission: EM | Admit: 2022-03-26 | Discharge: 2022-03-26 | Discharge status: Against Medical Advice | Payer: 59 | Attending: Student | Admitting: Student

## 2022-03-26 DIAGNOSIS — J45909 Unspecified asthma, uncomplicated: Secondary | ICD-10-CM | POA: Diagnosis not present

## 2022-03-26 DIAGNOSIS — I10 Essential (primary) hypertension: Secondary | ICD-10-CM | POA: Diagnosis not present

## 2022-03-26 DIAGNOSIS — R112 Nausea with vomiting, unspecified: Secondary | ICD-10-CM | POA: Diagnosis not present

## 2022-03-26 DIAGNOSIS — R000 Tachycardia, unspecified: Secondary | ICD-10-CM | POA: Insufficient documentation

## 2022-03-26 DIAGNOSIS — D72829 Elevated white blood cell count, unspecified: Secondary | ICD-10-CM | POA: Diagnosis not present

## 2022-03-26 DIAGNOSIS — Z79899 Other long term (current) drug therapy: Secondary | ICD-10-CM | POA: Diagnosis not present

## 2022-03-26 DIAGNOSIS — R748 Abnormal levels of other serum enzymes: Secondary | ICD-10-CM | POA: Insufficient documentation

## 2022-03-26 DIAGNOSIS — A084 Viral intestinal infection, unspecified: Secondary | ICD-10-CM

## 2022-03-26 DIAGNOSIS — R197 Diarrhea, unspecified: Secondary | ICD-10-CM | POA: Diagnosis not present

## 2022-03-26 DIAGNOSIS — R7401 Elevation of levels of liver transaminase levels: Secondary | ICD-10-CM | POA: Diagnosis not present

## 2022-03-26 DIAGNOSIS — Z7982 Long term (current) use of aspirin: Secondary | ICD-10-CM | POA: Insufficient documentation

## 2022-03-26 DIAGNOSIS — R1013 Epigastric pain: Secondary | ICD-10-CM | POA: Diagnosis not present

## 2022-03-26 DIAGNOSIS — Z7951 Long term (current) use of inhaled steroids: Secondary | ICD-10-CM | POA: Insufficient documentation

## 2022-03-26 LAB — COMPREHENSIVE METABOLIC PANEL
ALT: 75 U/L — ABNORMAL HIGH (ref 0–44)
AST: 121 U/L — ABNORMAL HIGH (ref 15–41)
Albumin: 3.9 g/dL (ref 3.5–5.0)
Alkaline Phosphatase: 62 U/L (ref 38–126)
Anion gap: 11 (ref 5–15)
BUN: 17 mg/dL (ref 6–20)
CO2: 25 mmol/L (ref 22–32)
Calcium: 8.4 mg/dL — ABNORMAL LOW (ref 8.9–10.3)
Chloride: 100 mmol/L (ref 98–111)
Creatinine, Ser: 0.99 mg/dL (ref 0.61–1.24)
GFR, Estimated: >60 mL/min (ref >60)
Glucose, Bld: 125 mg/dL — ABNORMAL HIGH (ref 70–99)
Potassium: 3.9 mmol/L (ref 3.5–5.1)
Sodium: 136 mmol/L (ref 135–145)
Total Bilirubin: 1.1 mg/dL (ref 0.3–1.2)
Total Protein: 7.3 g/dL (ref 6.5–8.1)

## 2022-03-26 LAB — URINALYSIS, ROUTINE W REFLEX MICROSCOPIC
Bacteria, UA: NONE SEEN
Bilirubin Urine: NEGATIVE
Glucose, UA: NEGATIVE mg/dL
Hgb urine dipstick: NEGATIVE
Ketones, ur: NEGATIVE mg/dL
Leukocytes,Ua: NEGATIVE
Nitrite: NEGATIVE
Protein, ur: 30 mg/dL — AB
Specific Gravity, Urine: 1.026 (ref 1.005–1.030)
pH: 6 (ref 5.0–8.0)

## 2022-03-26 LAB — CBC
HCT: 45.8 % (ref 39.0–52.0)
Hemoglobin: 15.7 g/dL (ref 13.0–17.0)
MCH: 30 pg (ref 26.0–34.0)
MCHC: 34.3 g/dL (ref 30.0–36.0)
MCV: 87.4 fL (ref 80.0–100.0)
Platelets: 267 10*3/uL (ref 150–400)
RBC: 5.24 MIL/uL (ref 4.22–5.81)
RDW: 13.2 % (ref 11.5–15.5)
WBC: 15 10*3/uL — ABNORMAL HIGH (ref 4.0–10.5)
nRBC: 0 % (ref 0.0–0.2)

## 2022-03-26 LAB — LIPASE, BLOOD: Lipase: 191 U/L — ABNORMAL HIGH (ref 11–51)

## 2022-03-26 MED ORDER — LIDOCAINE VISCOUS HCL 2 % MT SOLN
15.0000 mL | Freq: Once | OROMUCOSAL | Status: AC
  Administered 2022-03-26: 15 mL via ORAL
  Filled 2022-03-26: qty 15

## 2022-03-26 MED ORDER — ONDANSETRON HCL 4 MG/2ML IJ SOLN
4.0000 mg | Freq: Once | INTRAMUSCULAR | Status: AC | PRN
  Administered 2022-03-26: 4 mg via INTRAVENOUS
  Filled 2022-03-26: qty 2

## 2022-03-26 MED ORDER — LACTATED RINGERS IV BOLUS
1000.0000 mL | Freq: Once | INTRAVENOUS | Status: AC
  Administered 2022-03-26: 1000 mL via INTRAVENOUS

## 2022-03-26 MED ORDER — IOHEXOL 300 MG/ML  SOLN
100.0000 mL | Freq: Once | INTRAMUSCULAR | Status: AC | PRN
  Administered 2022-03-26: 100 mL via INTRAVENOUS

## 2022-03-26 MED ORDER — ALUM & MAG HYDROXIDE-SIMETH 200-200-20 MG/5ML PO SUSP
30.0000 mL | Freq: Once | ORAL | Status: AC
  Administered 2022-03-26: 30 mL via ORAL
  Filled 2022-03-26: qty 30

## 2022-03-26 NOTE — ED Triage Notes (Signed)
Pt arrived [facility] for n/v/d x 2 days with Abd pain. 20 G left AC.  Vomiting currently. Marland Kitchen

## 2022-03-26 NOTE — ED Notes (Signed)
Patient transported to CT 

## 2022-03-27 NOTE — ED Provider Notes (Addendum)
[facility] Provider Note  CSN: [id number] Arrival date & time: 03/26/22 1529  Chief Complaint(s) Nausea, Abdominal Pain, Emesis, and Diarrhea  HPI Logan Dawson is a 27 y.o. male with PMH TBI, anxiety, asthma who presents emergency department for evaluation of abdominal pain nausea, vomiting and diarrhea.  Patient has children at home who are sick with similar symptoms and was seen at an outside emergency department this morning.  He was ultimately pain controlled and nausea controlled and discharged home with Zofran but patient was breaking through the symptoms and was having persistent vomiting.  Also endorsing epigastric abdominal pain.  On arrival, patient arrives tachycardic and ill-appearing with complaints of epigastric abdominal pain.  Denies chest pain, shortness of breath, headache, fever or other systemic symptoms.   Past Medical History Past Medical History:  Diagnosis Date   ADHD (attention deficit hyperactivity disorder)    Anxiety    Asthma    Gastroenteritis    Hypertension    Left shoulder pain    Meningitis    Migraine    after skull fracture   Skull fracture (HCC)    Subarachnoid hemorrhage ([place])    Patient Active Problem List   Diagnosis Date Noted   Spondylosis of lumbar spine 02/21/2022   Lumbar facet arthropathy 02/21/2022   Chronic diarrhea 06/29/2021   Seasonal allergic rhinitis due to pollen 06/22/2021   Lung nodule, solitary 03/07/2021   Primary hypertension 03/07/2021   Depression, major, single episode, mild ([place]) 03/07/2021   GAD (generalized anxiety disorder) 03/07/2021   Hematochezia [id number]   History of Helicobacter pylori infection 02/20/2021   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   Abdominal pain, chronic, epigastric 08/15/2020   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   Helicobacter pylori antibody positive 08/15/2020   CAP (community acquired pneumonia) 08/11/2019   Community  acquired pneumonia 08/10/2019   Sepsis due to pneumonia ([place]) 08/10/2019   Black-out (not amnesia) 11/27/2012   Concussion with < 1 hr loss of consciousness 11/27/2012   Hearing voices 11/27/2012   Subarachnoid hemorrhage ([place]) 11/27/2012   Unable to control anger 11/27/2012   Abrasions of multiple sites 11/14/2012   SAH (subarachnoid hemorrhage) ([place]) 11/14/2012   Trauma 11/14/2012   Home Medication(s) Prior to Admission medications   Medication Sig Start Date End Date Taking? Authorizing Provider  albuterol (VENTOLIN HFA) 108 (90 Base) MCG/ACT inhaler 1 puff as needed Inhalation every 4 hrs for 30 Days 03/23/19   [provider]  aspirin-acetaminophen-caffeine (EXCEDRIN MIGRAINE) [phone] MG tablet Take 2 tablets by mouth every 6 (six) hours as needed for headache.    [provider]  cholestyramine (QUESTRAN) 4 g packet Take 1 packet (4 g total) by mouth 3 (three) times daily with meals. Patient taking differently: Take 4 g by mouth daily. 08/07/21   Harvel Quale, MD  dicyclomine (BENTYL) 20 MG tablet Take 20 mg by mouth 2 (two) times daily. 01/10/22   [provider]  FLUoxetine (PROZAC) 20 MG capsule 1 capsule Orally Once a day for 30 days 06/09/19   [provider]  fluticasone (FLONASE) 50 MCG/ACT nasal spray Place 2 sprays into both nostrils daily. Patient not taking: Reported on 02/21/2022 06/22/21   Baruch Gouty, FNP  hydrocortisone cream 1 % Apply to affected area 2 times daily Patient not taking: Reported on 02/21/2022 09/19/21 09/19/22  [provider]  levocetirizine (XYZAL) 5 MG tablet Take 1 tablet (5 mg total) by mouth every evening. Patient not taking:  Reported on 02/21/2022 06/22/21   Baruch Gouty, FNP  meloxicam (MOBIC) 15 MG tablet Take 15 mg by mouth daily as needed.    [provider]  methocarbamol (ROBAXIN) 500 MG tablet Take 1 tablet (500 mg total) by mouth every 6 (six) hours as needed for muscle spasms.  02/21/22   Meredith Staggers, MD  metoprolol succinate (TOPROL-XL) 50 MG 24 hr tablet 1 tablet for blood pressure Orally Once a day for 30 day(s) 08/18/18   [provider]  ondansetron (ZOFRAN-ODT) 4 MG disintegrating tablet Take 4 mg by mouth every 8 (eight) hours as needed. 01/10/22   [provider]  pantoprazole (PROTONIX) 20 MG tablet Take 1 tablet (20 mg total) by mouth 2 (two) times daily. 04/15/21 02/21/22  Dorie Rank, MD  potassium chloride (KLOR-CON) 10 MEQ tablet Take 10 mEq by mouth daily. Patient not taking: Reported on 02/21/2022 10/02/21   [provider]  predniSONE (DELTASONE) 20 MG tablet 3 po daily for 2 days, then 2 po daily for 2 days, then 1 po daily for 2 days Patient not taking: Reported on 02/21/2022 02/13/22   [provider]  sildenafil (REVATIO) 20 MG tablet 1 tablet Orally Once a day for 30 Patient not taking: Reported on 02/21/2022    [provider]                                                                                                                                    Past Surgical History Past Surgical History:  Procedure Laterality Date   APPENDECTOMY     BIOPSY  08/16/2020   Procedure: BIOPSY;  Surgeon: Harvel Quale, MD;  Location[facility];  Service: Gastroenterology;;   BIOPSY  08/25/2021   Procedure: BIOPSY;  Surgeon: Harvel Quale, MD;  Location: [facility];  Service: Gastroenterology;;   CHOLECYSTECTOMY     COLONOSCOPY WITH PROPOFOL N/A 08/25/2021   Procedure: COLONOSCOPY WITH PROPOFOL;  Surgeon: Harvel Quale, MD;  Location: [facility];  Service: Gastroenterology;  Laterality: N/A;  12:30 ASA 1   ESOPHAGOGASTRODUODENOSCOPY (EGD) WITH PROPOFOL N/A 08/16/2020   Jenetta Downer, H pylori positive   HERNIA REPAIR     Family History Family History  Problem Relation Age of Onset   Diabetes Mother    COPD Mother    Diabetes Father    Hypertension Father    Heart  disease Father     Social History Social History   Tobacco Use   Smoking status: Never   Smokeless tobacco: Never  Vaping Use   Vaping Use: Never used  Substance Use Topics   Alcohol use: Not Currently   Drug use: No   Allergies Sulfa antibiotics, Lisinopril, and Sulfamethoxazole  Review of Systems Review of Systems  Gastrointestinal:  Positive for abdominal pain, diarrhea, nausea and vomiting.    Physical Exam Vital Signs  I have reviewed the triage vital  signs BP 109/78   Pulse (!) 120   Temp 98.3 F (36.8 C) (Oral)   Resp 17   Ht 6' (1.829 m)   Wt (!) 152.4 kg   SpO2 94%   BMI 45.57 kg/m   Physical Exam Constitutional:      General: He is not in acute distress.    Appearance: Normal appearance. He is ill-appearing.  HENT:     Head: Normocephalic and atraumatic.     Nose: No congestion or rhinorrhea.  Eyes:     General:        Right eye: No discharge.        Left eye: No discharge.     Extraocular Movements: Extraocular movements intact.     Pupils: Pupils are equal, round, and reactive to light.  Cardiovascular:     Rate and Rhythm: Regular rhythm. Tachycardia present.     Heart sounds: No murmur heard. Pulmonary:     Effort: No respiratory distress.     Breath sounds: No wheezing or rales.  Abdominal:     General: There is no distension.     Tenderness: There is abdominal tenderness in the epigastric area.  Musculoskeletal:        General: Normal range of motion.     Cervical back: Normal range of motion.  Skin:    General: Skin is warm and dry.  Neurological:     General: No focal deficit present.     Mental Status: He is alert.     ED Results and Treatments Labs (all labs ordered are listed, but only abnormal results are displayed) Labs Reviewed  LIPASE, BLOOD - Abnormal; Notable for the following components:      Result Value   Lipase 191 (*)    All other components within normal limits  COMPREHENSIVE METABOLIC PANEL - Abnormal;  Notable for the following components:   Glucose, Bld 125 (*)    Calcium 8.4 (*)    AST 121 (*)    ALT 75 (*)    All other components within normal limits  CBC - Abnormal; Notable for the following components:   WBC 15.0 (*)    All other components within normal limits  URINALYSIS, ROUTINE W REFLEX MICROSCOPIC - Abnormal; Notable for the following components:   Protein, ur 30 (*)    All other components within normal limits                                                                                                                          Radiology CT ABDOMEN PELVIS W CONTRAST  Result Date: 03/26/2022 CLINICAL DATA:  Nausea/vomiting/diarrhea for 2 days, epigastric abdominal pain EXAM: CT ABDOMEN AND PELVIS WITH CONTRAST TECHNIQUE: Multidetector CT imaging of the abdomen and pelvis was performed using the standard protocol following bolus administration of intravenous contrast. RADIATION DOSE REDUCTION: This exam was performed according to the departmental dose-optimization program which includes automated exposure control, adjustment of the mA and/or kV according  to patient size and/or use of iterative reconstruction technique. CONTRAST:  145m OMNIPAQUE IOHEXOL 300 MG/ML  SOLN COMPARISON:  02/13/2022 FINDINGS: Lower chest: No acute pleural or parenchymal lung disease. Hepatobiliary: Hepatic steatosis. No focal liver abnormality. No biliary duct dilation. Cholecystectomy. Pancreas: Unremarkable. No pancreatic ductal dilatation or surrounding inflammatory changes. Spleen: Normal in size without focal abnormality. Adrenals/Urinary Tract: Adrenal glands are unremarkable. Kidneys are normal, without renal calculi, focal lesion, or hydronephrosis. Bladder is unremarkable. Stomach/Bowel: There is mild distension of the proximal jejunum measuring up to 3.7 cm in diameter. Scattered gas fluid levels are seen throughout the small and large bowel. No abrupt transition zone identified. Overall I would favor  regional ileus likely as result of underlying gastroenteritis given clinical symptoms. No bowel wall thickening or inflammatory change. Vascular/Lymphatic: No significant vascular findings are present. No enlarged abdominal or pelvic lymph nodes. Reproductive: Prostate is unremarkable. Other: No free fluid or free intraperitoneal gas. No abdominal wall hernia. Musculoskeletal: No acute or destructive bony lesions. Reconstructed images demonstrate no additional findings. IMPRESSION: 1. Mild distension of the proximal jejunum, with scattered gas fluid levels throughout the large and small bowel. Overall, I would favor regional ileus and underlying gastroenteritis. No evidence of high-grade obstruction. Serial radiographic follow-up may be useful. 2. Hepatic steatosis. Electronically Signed   By: MRanda NgoM.D.   On: 03/26/2022 18:43    Pertinent labs & imaging results that were available during my care of the patient were reviewed by me and considered in my medical decision making (see MDM for details).  Medications Ordered in ED Medications  ondansetron (ZOFRAN) injection 4 mg (4 mg Intravenous Given 03/26/22 1636)  lactated ringers bolus 1,000 mL (0 mLs Intravenous Stopped 03/26/22 1819)  iohexol (OMNIPAQUE) 300 MG/ML solution 100 mL (100 mLs Intravenous Contrast Given 03/26/22 1831)  alum & mag hydroxide-simeth (MAALOX/MYLANTA) 200-200-20 MG/5ML suspension 30 mL (30 mLs Oral Given 03/26/22 1935)    And  lidocaine (XYLOCAINE) 2 % viscous mouth solution 15 mL (15 mLs Oral Given 03/26/22 1935)                                                                                                                                     Procedures Procedures  (including critical care time)  Medical Decision Making / ED Course   This patient presents to the ED for concern of abdominal pain, nausea, vomiting, diarrhea, this involves an extensive number of treatment options, and is a complaint that carries with  it a high risk of complications and morbidity.  The differential diagnosis includes gastroenteritis, GERD/gastritis, peptic ulcer disease, pancreatitis, gastroparesis, pneumonia, pleurisy, pericarditis  MDM: Patient seen emergency room for evaluation of abdominal pain nausea, vomiting and diarrhea.  Physical exam with mild epigastric tenderness to palpation but is otherwise unremarkable.  Laboratory evaluation with a mild transaminitis with an AST of 121, ALT 75 a, leukocytosis to 15 and lipase 191.  CT  abdomen pelvis showing mild distention of the proximal jejunum with scattered gas/fluid levels consistent with likely gastroenteritis with regional ileus.  Patient was given Zofran and fluids as well as a GI cocktail and on reevaluation his symptoms have significantly improved.  His initial presenting tachycardia has resolved and heart rates less than 100 on cardiac monitoring.  He is able to tolerate p.o. without difficulty.  The patient is 152 kg and likely is requiring 8 mg dosing of Zofran instead of 4 mg states using at home.  I did offer the patient admission due to failure of outpatient medicines but he is requesting be discharged which is not unreasonable at this time given his symptomatic improvement.  Patient presentation appears consistent with gastroenteritis and despite lipase elevation, with negative CT imaging I have lower suspicion for pancreatitis at this time.  He was given return precautions which he voiced understanding and was discharged.    Additional history obtained:  -External records from outside source obtained and reviewed including: Chart review including previous notes, labs, imaging, consultation notes   Lab Tests: -I ordered, reviewed, and interpreted labs.   The pertinent results include:   Labs Reviewed  LIPASE, BLOOD - Abnormal; Notable for the following components:      Result Value   Lipase 191 (*)    All other components within normal limits  COMPREHENSIVE  METABOLIC PANEL - Abnormal; Notable for the following components:   Glucose, Bld 125 (*)    Calcium 8.4 (*)    AST 121 (*)    ALT 75 (*)    All other components within normal limits  CBC - Abnormal; Notable for the following components:   WBC 15.0 (*)    All other components within normal limits  URINALYSIS, ROUTINE W REFLEX MICROSCOPIC - Abnormal; Notable for the following components:   Protein, ur 30 (*)    All other components within normal limits      Imaging Studies ordered: I ordered imaging studies including CT abdomen pelvis I independently visualized and interpreted imaging. I agree with the radiologist interpretation   Medicines ordered and prescription drug management: Meds ordered this encounter  Medications   ondansetron (ZOFRAN) injection 4 mg   lactated ringers bolus 1,000 mL   iohexol (OMNIPAQUE) 300 MG/ML solution 100 mL   AND Linked Order Group    alum & mag hydroxide-simeth (MAALOX/MYLANTA) 200-200-20 MG/5ML suspension 30 mL    lidocaine (XYLOCAINE) 2 % viscous mouth solution 15 mL    -I have reviewed the patients home medicines and have made adjustments as needed  Critical interventions none    Cardiac Monitoring: The patient was maintained on a cardiac monitor.  I personally viewed and interpreted the cardiac monitored which showed an underlying rhythm of: NSR, sinus tachycardia  Social Determinants of Health:  Factors impacting patients care include: Sick children at home   Reevaluation: After the interventions noted above, I reevaluated the patient and found that they have :improved  Co morbidities that complicate the patient evaluation  Past Medical History:  Diagnosis Date   ADHD (attention deficit hyperactivity disorder)    Anxiety    Asthma    Gastroenteritis    Hypertension    Left shoulder pain    Meningitis    Migraine    after skull fracture   Skull fracture (HCC)    Subarachnoid hemorrhage ([place])       Dispostion: I  considered admission for this patient, and I did offer the patient admission for trending  of lipase and possible underlying pancreatitis, but he is requesting be discharged with outpatient follow-up which is not unreasonable.  I do have lower suspicion for pancreatitis at this time and his symptoms do appear consistent with gastroenteritis given diarrhea and positive sick contacts at home     Final Clinical Impression(s) / ED Diagnoses Final diagnoses:  None     '@PCDICTATION'$ @    Teressa Lower, MD 03/27/22 [place], MD 03/30/22 1525

## 2022-04-26 ENCOUNTER — Emergency Department (HOSPITAL_COMMUNITY)
Admission: EM | Admit: 2022-04-26 | Discharge: 2022-04-26 | Disposition: A | Discharge status: Home / Self Care | Payer: Medicaid Other | Attending: Emergency Medicine | Admitting: Emergency Medicine

## 2022-04-26 ENCOUNTER — Emergency Department (HOSPITAL_COMMUNITY): Payer: Medicaid Other

## 2022-04-26 ENCOUNTER — Other Ambulatory Visit: Payer: Self-pay

## 2022-04-26 DIAGNOSIS — J181 Lobar pneumonia, unspecified organism: Secondary | ICD-10-CM | POA: Insufficient documentation

## 2022-04-26 DIAGNOSIS — R000 Tachycardia, unspecified: Secondary | ICD-10-CM | POA: Diagnosis not present

## 2022-04-26 DIAGNOSIS — Z7951 Long term (current) use of inhaled steroids: Secondary | ICD-10-CM | POA: Insufficient documentation

## 2022-04-26 DIAGNOSIS — Z1152 Encounter for screening for COVID-19: Secondary | ICD-10-CM | POA: Diagnosis not present

## 2022-04-26 DIAGNOSIS — J45909 Unspecified asthma, uncomplicated: Secondary | ICD-10-CM | POA: Diagnosis not present

## 2022-04-26 DIAGNOSIS — I10 Essential (primary) hypertension: Secondary | ICD-10-CM | POA: Insufficient documentation

## 2022-04-26 DIAGNOSIS — Z79899 Other long term (current) drug therapy: Secondary | ICD-10-CM | POA: Insufficient documentation

## 2022-04-26 DIAGNOSIS — J189 Pneumonia, unspecified organism: Secondary | ICD-10-CM

## 2022-04-26 DIAGNOSIS — R0602 Shortness of breath: Secondary | ICD-10-CM | POA: Diagnosis present

## 2022-04-26 LAB — CBC
HCT: 45.5 % (ref 39.0–52.0)
Hemoglobin: 16 g/dL (ref 13.0–17.0)
MCH: 29.9 pg (ref 26.0–34.0)
MCHC: 35.2 g/dL (ref 30.0–36.0)
MCV: 84.9 fL (ref 80.0–100.0)
Platelets: 259 10*3/uL (ref 150–400)
RBC: 5.36 MIL/uL (ref 4.22–5.81)
RDW: 12.7 % (ref 11.5–15.5)
WBC: 10.4 10*3/uL (ref 4.0–10.5)
nRBC: 0 % (ref 0.0–0.2)

## 2022-04-26 LAB — RESP PANEL BY RT-PCR (RSV, FLU A&B, COVID)  RVPGX2
Influenza A by PCR: NEGATIVE
Influenza B by PCR: NEGATIVE
Resp Syncytial Virus by PCR: NEGATIVE
SARS Coronavirus 2 by RT PCR: NEGATIVE

## 2022-04-26 LAB — TROPONIN I (HIGH SENSITIVITY): Troponin I (High Sensitivity): 17 ng/L (ref <18)

## 2022-04-26 LAB — BASIC METABOLIC PANEL
Anion gap: 13 (ref 5–15)
BUN: 7 mg/dL (ref 6–20)
CO2: 27 mmol/L (ref 22–32)
Calcium: 8.9 mg/dL (ref 8.9–10.3)
Chloride: 94 mmol/L — ABNORMAL LOW (ref 98–111)
Creatinine, Ser: 0.99 mg/dL (ref 0.61–1.24)
GFR, Estimated: >60 mL/min (ref >60)
Glucose, Bld: 114 mg/dL — ABNORMAL HIGH (ref 70–99)
Potassium: 3.8 mmol/L (ref 3.5–5.1)
Sodium: 134 mmol/L — ABNORMAL LOW (ref 135–145)

## 2022-04-26 MED ORDER — ONDANSETRON HCL 4 MG/2ML IJ SOLN
4.0000 mg | Freq: Once | INTRAMUSCULAR | Status: AC
  Administered 2022-04-26: 4 mg via INTRAVENOUS
  Filled 2022-04-26: qty 2

## 2022-04-26 MED ORDER — SODIUM CHLORIDE 0.9 % IV BOLUS
1000.0000 mL | Freq: Once | INTRAVENOUS | Status: AC
  Administered 2022-04-26: 1000 mL via INTRAVENOUS

## 2022-04-26 MED ORDER — IPRATROPIUM-ALBUTEROL 0.5-2.5 (3) MG/3ML IN SOLN
3.0000 mL | Freq: Once | RESPIRATORY_TRACT | Status: AC
  Administered 2022-04-26: 3 mL via RESPIRATORY_TRACT
  Filled 2022-04-26: qty 3

## 2022-04-26 MED ORDER — AMOXICILLIN-POT CLAVULANATE 875-125 MG PO TABS: 1.0000 | ORAL_TABLET | Freq: Two times a day (BID) | ORAL | 0 refills | Status: AC

## 2022-04-26 MED ORDER — DEXAMETHASONE SODIUM PHOSPHATE 10 MG/ML IJ SOLN
10.0000 mg | Freq: Once | INTRAMUSCULAR | Status: AC
  Administered 2022-04-26: 10 mg via INTRAVENOUS
  Filled 2022-04-26: qty 1

## 2022-04-26 MED ORDER — GUAIFENESIN-DM 100-10 MG/5ML PO SYRP
5.0000 mL | ORAL_SOLUTION | ORAL | Status: DC | PRN
  Administered 2022-04-26: 5 mL via ORAL
  Filled 2022-04-26: qty 5

## 2022-04-26 NOTE — ED Triage Notes (Signed)
Pt. Stated, Ive had cough, chest pain, and SOB . Ive had to use my inhaler , cause I do have asthma. I went to my Dr. 2 days ago and they tested me for everything and everything was negative and use my inhaler.

## 2022-04-26 NOTE — ED Provider Notes (Signed)
[facility] Provider Note   CSN: [id number] Arrival date & time: 04/26/22  [id number]     History  Chief Complaint  Patient presents with   Shortness of Breath   Cough   Chest Pain    Logan Dawson is a 27 y.o. male.  With past medical history of asthma, hypertension, SAH who presents to the emergency department with cough and shortness of breath.  States he has had symptoms for about 4 days.  He describes having nonproductive cough that is progressively worsened and he now feels short of breath and is having aching generalized chest pain.  He states that last night he felt febrile.  He is having nausea with an episode of vomiting and having diarrhea.  He denies having abdominal pain.  He was seen at [facility] and he states that he had negative viral testing and was sent home.  States that he has been using his home albuterol inhaler with minimal relief of symptoms.  He denies sick contacts.   Shortness of Breath Associated symptoms: chest pain and cough   Cough Associated symptoms: chest pain and shortness of breath   Chest Pain Associated symptoms: cough and shortness of breath        Home Medications Prior to Admission medications   Medication Sig Start Date End Date Taking? Authorizing Provider  amoxicillin-clavulanate (AUGMENTIN) 875-125 MG tablet Take 1 tablet by mouth every 12 (twelve) hours for 7 days. 04/26/22 05/03/22 Yes Mickie Hillier, PA-C  albuterol (VENTOLIN HFA) 108 (90 Base) MCG/ACT inhaler 1 puff as needed Inhalation every 4 hrs for 30 Days 03/23/19   [provider]  aspirin-acetaminophen-caffeine (EXCEDRIN MIGRAINE) [phone] MG tablet Take 2 tablets by mouth every 6 (six) hours as needed for headache.    [provider]  cholestyramine (QUESTRAN) 4 g packet Take 1 packet (4 g total) by mouth 3 (three) times daily with meals. Patient taking differently: Take 4 g by mouth daily. 08/07/21   Harvel Quale, MD  dicyclomine (BENTYL) 20 MG tablet Take 20 mg by mouth 2 (two) times daily. 01/10/22   [provider]  FLUoxetine (PROZAC) 20 MG capsule 1 capsule Orally Once a day for 30 days 06/09/19   [provider]  fluticasone (FLONASE) 50 MCG/ACT nasal spray Place 2 sprays into both nostrils daily. Patient not taking: Reported on 02/21/2022 06/22/21   Baruch Gouty, FNP  hydrocortisone cream 1 % Apply to affected area 2 times daily Patient not taking: Reported on 02/21/2022 09/19/21 09/19/22  [provider]  levocetirizine (XYZAL) 5 MG tablet Take 1 tablet (5 mg total) by mouth every evening. Patient not taking: Reported on 02/21/2022 06/22/21   Baruch Gouty, FNP  meloxicam (MOBIC) 15 MG tablet Take 15 mg by mouth daily as needed.    [provider]  methocarbamol (ROBAXIN) 500 MG tablet Take 1 tablet (500 mg total) by mouth every 6 (six) hours as needed for muscle spasms. 02/21/22   Meredith Staggers, MD  metoprolol succinate (TOPROL-XL) 50 MG 24 hr tablet 1 tablet for blood pressure Orally Once a day for 30 day(s) 08/18/18   [provider]  ondansetron (ZOFRAN-ODT) 4 MG disintegrating tablet Take 4 mg by mouth every 8 (eight) hours as needed. 01/10/22   [provider]  pantoprazole (PROTONIX) 20 MG tablet Take 1 tablet (20 mg total) by mouth 2 (two) times daily. 04/15/21 02/21/22  Dorie Rank, MD  potassium  chloride (KLOR-CON) 10 MEQ tablet Take 10 mEq by mouth daily. Patient not taking: Reported on 02/21/2022 10/02/21   [provider]  predniSONE (DELTASONE) 20 MG tablet 3 po daily for 2 days, then 2 po daily for 2 days, then 1 po daily for 2 days Patient not taking: Reported on 02/21/2022 02/13/22   [provider]  sildenafil (REVATIO) 20 MG tablet 1 tablet Orally Once a day for 30 Patient not taking: Reported on 02/21/2022    [provider]      Allergies    Sulfa antibiotics, Lisinopril, and Sulfamethoxazole     Review of Systems   Review of Systems  Respiratory:  Positive for cough and shortness of breath.   Cardiovascular:  Positive for chest pain.  All other systems reviewed and are negative.   Physical Exam Updated Vital Signs BP (!) 149/82 (BP Location: Right Arm)   Pulse (!) 121   Temp 98.9 F (37.2 C) (Oral)   Resp 20   Ht 6' (1.829 m)   Wt (!) 152.4 kg   SpO2 97%   BMI 45.57 kg/m  Physical Exam Vitals and nursing note reviewed.  Constitutional:      General: He is not in acute distress.    Appearance: Normal appearance. He is obese. He is ill-appearing.  HENT:     Head: Normocephalic.  Eyes:     General: No scleral icterus.    Extraocular Movements: Extraocular movements intact.  Cardiovascular:     Rate and Rhythm: Regular rhythm. Tachycardia present.     Pulses: Normal pulses.     Heart sounds: No murmur heard. Pulmonary:     Effort: Pulmonary effort is normal. No tachypnea or respiratory distress.     Breath sounds: Examination of the right-middle field reveals rales. Examination of the right-lower field reveals decreased breath sounds. Examination of the left-lower field reveals decreased breath sounds and rales. Decreased breath sounds and rales present. No wheezing.  Chest:     Chest wall: No tenderness.  Abdominal:     General: Bowel sounds are normal.     Palpations: Abdomen is soft.  Skin:    General: Skin is warm and dry.     Capillary Refill: Capillary refill takes less than 2 seconds.  Neurological:     General: No focal deficit present.     Mental Status: He is alert and oriented to person, place, and time.  Psychiatric:        Mood and Affect: Mood normal.        Behavior: Behavior normal.     ED Results / Procedures / Treatments   Labs (all labs ordered are listed, but only abnormal results are displayed) Labs Reviewed  BASIC METABOLIC PANEL - Abnormal; Notable for the following components:      Result Value   Sodium 134 (*)    Chloride  94 (*)    Glucose, Bld 114 (*)    All other components within normal limits  RESP PANEL BY RT-PCR (RSV, FLU A&B, COVID)  [id number]  CBC  TROPONIN I (HIGH SENSITIVITY)    EKG EKG Interpretation  Date/Time:  Thursday April 26 2022 07:42:14 EDT Ventricular Rate:  125 PR Interval:  130 QRS Duration: 82 QT Interval:  312 QTC Calculation: 450 R Axis:   83 Text Interpretation: Sinus tachycardia Otherwise normal ECG When compared with ECG of 09-May-2021 15:21, PREVIOUS ECG IS PRESENT Confirmed by Nanda Quinton [phone]) on 04/26/2022 8:01:22 AM  Radiology DG Chest 2  View  Result Date: 04/26/2022 CLINICAL DATA:  Shortness of breath, cough, chest pain EXAM: CHEST - 2 VIEW COMPARISON:  Chest radiograph 04/23/2022 FINDINGS: The cardiac silhouette is borderline enlarged, unchanged. The upper mediastinal contours are normal. Lung volumes are low. There is patchy airspace opacity in the medial right lower lobe which is new/increased from prior. There is no other focal airspace opacity. There is no pulmonary edema. There is no pleural effusion or pneumothorax There is no acute osseous abnormality. IMPRESSION: Opacities in the medial right lower lobe could reflect infection in the correct clinical setting. Electronically Signed   By: Valetta Mole M.D.   On: 04/26/2022 08:41    Procedures Procedures   Medications Ordered in ED Medications  guaiFENesin-dextromethorphan (ROBITUSSIN DM) 100-10 MG/5ML syrup 5 mL (5 mLs Oral Given 04/26/22 0900)  sodium chloride 0.9 % bolus 1,000 mL (1,000 mLs Intravenous New Bag/Given 04/26/22 0900)  ondansetron (ZOFRAN) injection 4 mg (4 mg Intravenous Given 04/26/22 0859)  ipratropium-albuterol (DUONEB) 0.5-2.5 (3) MG/3ML nebulizer solution 3 mL (3 mLs Nebulization Given 04/26/22 0900)  dexamethasone (DECADRON) injection 10 mg (10 mg Intravenous Given 04/26/22 0859)    ED Course/ Medical Decision Making/ A&P  Medical Decision Making Amount and/or Complexity of Data  Reviewed External Data Reviewed: labs and radiology. Labs: ordered. Decision-making details documented in ED Course. Radiology: ordered and independent interpretation performed. Decision-making details documented in ED Course. ECG/medicine tests: ordered and independent interpretation performed. Decision-making details documented in ED Course.  Risk OTC drugs. Prescription drug management.  Initial Impression and Ddx 27 year old male who presents to the emergency department with shortness of breath and cough Patient PMH that increases complexity of ED encounter: Asthma, subarachnoid hemorrhage, obesity Differential: ACS, PE, pneumothorax, pleural effusion, cardiac tamponade, aortic dissection, COPD exacerbation, pneumonia, asthma exacerbation, congestive heart failure, viral upper respiratory infection, medication side effect, anaphylaxis, etc.    Interpretation of Diagnostics I independent reviewed and interpreted the labs as followed: Respiratory panel negative, CBC unremarkable, single troponin negative, BMP without AKI or severe electrolyte derangement  - I independently visualized the following imaging with scope of interpretation limited to determining acute life threatening conditions related to emergency care: Chest x-ray, which revealed probable right lower lobe pneumonia  Patient Reassessment and Ultimate Disposition/Management Ill-appearing 27 year old male who presents with shortness of breath and cough.  He is ill-appearing but nontoxic-appearing.  He is in no acute distress.  He is mildly tachycardic and presented with low-grade fever.  Oxygenating well on room air. He does have decreased lower lung sounds, Rales in the right.  Obtaining labs including respiratory panel, chest x-ray.  Will give him IV fluids, Zofran, DuoNeb, steroids and Robitussin and reassess.  On reassessment he is feeling somewhat improved.  Chest x-ray does demonstrate a right lower lobe pneumonia.   Continues to oxygenate well on room air.  Pending respiratory panel  Respiratory panel is negative.  CBC without leukocytosis.  He did have low-grade fever on presentation which has resolved.  Does not appear to be significantly dehydrated on his lab work.  There is no AKI.  EKG without ischemia or infarction, troponin x 1 is negative doubt ACS.  Considered but doubt PE.  He is Wells low risk.  Will not pursue D-dimer or CTA PE study.  His symptoms are inconsistent with other etiologies like pneumothorax, pleural effusion, asthma or COPD exacerbation or other emergencies like tamponade, dissection.  I will place him on Augmentin for 7 days.  Given him strict return precautions for worsening  symptoms.  Otherwise feel that he is safe for discharge at this time.  At time of discharge his heart rate did improve back to around 100.  Oxygenating 95 to 96% on room air without oxygen requirement.  Patient management required discussion with the following services or consulting groups:  None  Complexity of Problems Addressed Acute complicated illness or Injury  Additional Data Reviewed and Analyzed Further history obtained from: Past medical history and medications listed in the EMR, Prior ED visit notes, Care Everywhere, and Prior labs/imaging results  Patient Encounter Risk Assessment Prescriptions, SDOH impact on management, and Consideration of hospitalization  Final Clinical Impression(s) / ED Diagnoses Final diagnoses:  Community acquired pneumonia of right middle lobe of lung    Rx / DC Orders ED Discharge Orders          Ordered    amoxicillin-clavulanate (AUGMENTIN) 875-125 MG tablet  Every 12 hours        04/26/22 1038              Mickie Hillier, PA-C 04/26/22 1043    Margette Fast, MD 04/27/22 [phone]

## 2022-04-26 NOTE — Discharge Instructions (Addendum)
You were seen in the emergency department today for cough and shortness of breath.  You have community-acquired pneumonia.  I am placing you on Augmentin that you will take twice a day over the next 7 days.  Please return to the emergency department if you have worsening shortness of breath or difficulty breathing.  Please drink plenty of fluids and rest.

## 2022-05-16 ENCOUNTER — Encounter: Payer: Medicaid Other | Attending: Physical Medicine & Rehabilitation | Admitting: Physical Medicine & Rehabilitation

## 2022-05-16 DIAGNOSIS — Z79891 Long term (current) use of opiate analgesic: Secondary | ICD-10-CM | POA: Insufficient documentation

## 2022-05-16 DIAGNOSIS — M47816 Spondylosis without myelopathy or radiculopathy, lumbar region: Secondary | ICD-10-CM | POA: Insufficient documentation

## 2022-05-16 DIAGNOSIS — Z5181 Encounter for therapeutic drug level monitoring: Secondary | ICD-10-CM | POA: Insufficient documentation

## 2022-05-16 DIAGNOSIS — G894 Chronic pain syndrome: Secondary | ICD-10-CM | POA: Insufficient documentation

## 2022-06-08 ENCOUNTER — Ambulatory Visit: Payer: Medicaid Other | Admitting: Orthopaedic Surgery

## 2022-06-11 ENCOUNTER — Telehealth: Payer: Self-pay | Admitting: *Deleted

## 2022-06-11 NOTE — Telephone Encounter (Signed)
Called and lmovm that Logan Dawson will give him one more chance to be seen at the practice, but if he is late or no shows he will be dismissed again and will not get another chance.

## 2022-06-12 ENCOUNTER — Telehealth: Payer: Self-pay | Admitting: *Deleted

## 2022-06-12 NOTE — Telephone Encounter (Signed)
Patient was made aware of previous message, he did not want to make an appt at this time. Will call back to schedule.

## 2022-06-15 ENCOUNTER — Encounter: Payer: Self-pay | Admitting: Family

## 2022-06-15 ENCOUNTER — Ambulatory Visit (INDEPENDENT_AMBULATORY_CARE_PROVIDER_SITE_OTHER): Payer: Medicaid Other | Admitting: Family

## 2022-06-15 VITALS — BP 132/88 | HR 92 | Temp 97.0°F | Ht 72.0 in | Wt 327.2 lb

## 2022-06-15 DIAGNOSIS — K529 Noninfective gastroenteritis and colitis, unspecified: Secondary | ICD-10-CM | POA: Diagnosis not present

## 2022-06-15 DIAGNOSIS — R1031 Right lower quadrant pain: Secondary | ICD-10-CM

## 2022-06-15 MED ORDER — BENEFIBER DRINK MIX PO PACK: 1.0000 | PACK | Freq: Every day | ORAL | 1 refills | Status: DC

## 2022-06-15 MED ORDER — LOPERAMIDE HCL 2 MG PO TABS: 2.0000 mg | ORAL_TABLET | Freq: Four times a day (QID) | ORAL | 0 refills | Status: DC | PRN

## 2022-06-15 MED ORDER — DICYCLOMINE HCL 20 MG PO TABS: 20.0000 mg | ORAL_TABLET | Freq: Three times a day (TID) | ORAL | 1 refills | Status: DC

## 2022-06-15 NOTE — Progress Notes (Signed)
Subjective:    Patient ID: Logan Dawson, male    DOB: 03/27/1995, 27 y.o.   MRN: [id number]  Chief Complaint  Patient presents with   spot on stomach    Has been there for 6-8 months has gotten bigger. Hurts when pressing, sometimes does not have to press for it to hurt. Pt thinks it is lipoma. Has been to other doctors and are not sure what is going on.    Diarrhea    Had colonoscopy said it was clear. Says when he eats/drinks it goes right through him. Rakes had him on IBS medicine before and it helped. Wants to see about weight loss medication.   PT presents to the office today with diarrhea for over a year. She saw GI and had a negative colonoscopy and EGD. He has hx of H pylori and complete antibiotics.   He continues to have diarrhea at least 5-10 times a day.   He had a CT on 03/26/22 that showed, "1. Mild distension of the proximal jejunum, with scattered gas fluid levels throughout the large and small bowel. Overall, I would favor regional ileus and underlying gastroenteritis. No evidence of high-grade obstruction. Serial radiographic follow-up may be useful. 2. Hepatic steatosis."  He is also complaining of a mass on right lower abdomen that he noticed 6 months ago. Reports it has become larger. Reports a dull aching pain after pressing on the area.  Diarrhea  This is a chronic problem. The current episode started more than 1 year ago. The problem occurs 5 to 10 times per day. The problem has been unchanged. The stool consistency is described as Watery. The patient states that diarrhea awakens him from sleep. Associated symptoms include abdominal pain. Pertinent negatives include no bloating, fever, headaches or vomiting. He has tried anti-motility drug for the symptoms. The treatment provided mild relief.      Review of Systems  Constitutional:  Negative for fever.  Gastrointestinal:  Positive for abdominal pain and diarrhea. Negative for bloating and vomiting.   Neurological:  Negative for headaches.  All other systems reviewed and are negative.      Objective:   Physical Exam Vitals reviewed.  Constitutional:      General: He is not in acute distress.    Appearance: He is well-developed. He is obese.  HENT:     Head: Normocephalic.  Eyes:     General:        Right eye: No discharge.        Left eye: No discharge.     Pupils: Pupils are equal, round, and reactive to light.  Neck:     Thyroid: No thyromegaly.  Cardiovascular:     Rate and Rhythm: Normal rate and regular rhythm.     Heart sounds: Normal heart sounds. No murmur heard. Pulmonary:     Effort: Pulmonary effort is normal. No respiratory distress.     Breath sounds: Normal breath sounds. No wheezing.  Abdominal:     General: Bowel sounds are normal. There is no distension.     Palpations: Abdomen is soft.     Tenderness: There is no abdominal tenderness.  Musculoskeletal:        General: No tenderness. Normal range of motion.     Cervical back: Normal range of motion and neck supple.  Skin:    General: Skin is warm and dry.     Findings: No erythema or rash.     Comments: Pt is very obese and  has a scar in right lower abdomen. There is a pocket of fat that hangs differently, but I do not feel a mass.   Neurological:     Mental Status: He is alert and oriented to person, place, and time.     Cranial Nerves: No cranial nerve deficit.     Deep Tendon Reflexes: Reflexes are normal and symmetric.  Psychiatric:        Behavior: Behavior normal.        Thought Content: Thought content normal.        Judgment: Judgment normal.      .BP 132/88   Pulse 92   Temp (!) 97 F (36.1 C) (Temporal)   Ht 6' (1.829 m)   Wt (!) 327 lb 3.2 oz (148.4 kg)   SpO2 96%   BMI 44.38 kg/m       Assessment & Plan:  AUSENCIO DWYER comes in today with chief complaint of spot on stomach (Has been there for 6-8 months has gotten bigger. Hurts when pressing, sometimes does not have to  press for it to hurt. Pt thinks it is lipoma. Has been to other doctors and are not sure what is going on. ) and Diarrhea (Had colonoscopy said it was clear. Says when he eats/drinks it goes right through him. Rakes had him on IBS medicine before and it helped. Wants to see about weight loss medication.)   Diagnosis and orders addressed:  1. Chronic diarrhea Start fiber daily Bentyl as needed  Force fluids Avoid fatty foods, greasy foods - dicyclomine (BENTYL) 20 MG tablet; Take 1 tablet (20 mg total) by mouth 4 (four) times daily -  before meals and at bedtime.  Dispense: 120 tablet; Refill: 1 - loperamide (IMODIUM A-D) 2 MG tablet; Take 1 tablet (2 mg total) by mouth 4 (four) times daily as needed for diarrhea or loose stools.  Dispense: 30 tablet; Refill: 0 - Wheat Dextrin (BENEFIBER DRINK MIX) PACK; Take 1 Applicatorful by mouth daily.  Dispense: 90 each; Refill: 1 - Ambulatory referral to Gastroenterology  2. Morbid obesity (HCC)  3. Right lower quadrant abdominal pain - I do not see abdominal mass, pt has abdominal scar and fatty area is more pronounced. I think this is from scar tissue, but will order [place] because patient very adamant something is wrong.   - US Abdomen Limited; Future   Labs pending Health Maintenance reviewed Diet and exercise encouraged  Follow up plan: 3 months with PCP   Jannifer Rodney, FNP

## 2022-06-15 NOTE — Patient Instructions (Signed)
Diarrhea, Adult Diarrhea is frequent loose and sometimes watery bowel movements. Diarrhea can make you feel weak and cause you to become dehydrated. Dehydration is a condition in which there is not enough water or other fluids in the body. Dehydration can make you tired and thirsty, cause you to have a dry mouth, and decrease how often you urinate. Diarrhea typically lasts 2-3 days. However, it can last longer if it is a sign of something more serious. It is important to treat your diarrhea as told by your health care provider. Follow these instructions at home: Eating and drinking     Follow these recommendations as told by your health care provider: Take an oral rehydration solution (ORS). This is an over-the-counter medicine that helps return your body to its normal balance of nutrients and water. It is found at pharmacies and retail stores. Drink enough fluid to keep your urine pale yellow. Drink fluids such as water, diluted fruit juice, and low-calorie sports drinks. You can drink milk also, if desired. Sucking on ice chips is another way to get fluids. Avoid drinking fluids that contain a lot of sugar or caffeine, such as soda, energy drinks, and regular sports drinks. Avoid alcohol. Eat bland, easy-to-digest foods in small amounts as you are able. These foods include bananas, applesauce, rice, lean meats, toast, and crackers. Avoid spicy or fatty foods.  Medicines Take over-the-counter and prescription medicines only as told by your health care provider. If you were prescribed antibiotics, take them as told by your health care provider. Do not stop using the antibiotic even if you start to feel better. General instructions  Wash your hands often using soap and water for at least 20 seconds. If soap and water are not available, use hand sanitizer. Others in the household should wash their hands as well. Hands should be washed: After using the toilet or changing a diaper. Before  preparing, cooking, or serving food. While caring for a sick person or while visiting someone in a hospital. Rest at home while you recover. Take a warm bath to relieve any burning or pain from frequent diarrhea episodes. Watch your condition for any changes. Contact a health care provider if: You have a fever. Your diarrhea gets worse. You have new symptoms. You vomit every time you eat or drink. You feel light-headed, dizzy, or have a headache. You have muscle cramps. You have signs of dehydration, such as: Dark urine, very little urine, or no urine. Cracked lips. Dry mouth. Sunken eyes. Sleepiness. Weakness. You have bloody or black stools or stools that look like tar. You have severe pain, cramping, or bloating in your abdomen. Your skin feels cold and clammy. You feel confused. Get help right away if: You have chest pain or your heart is beating very quickly. You have trouble breathing or you are breathing very quickly. You feel extremely weak or you faint. These symptoms may be an emergency. Get help right away. Call 911. Do not wait to see if the symptoms will go away. Do not drive yourself to the hospital. This information is not intended to replace advice given to you by your health care provider. Make sure you discuss any questions you have with your health care provider. Document Revised: 07/11/2021 Document Reviewed: 07/11/2021 Elsevier Patient Education  2023 [employer]  

## 2022-07-04 ENCOUNTER — Ambulatory Visit (HOSPITAL_COMMUNITY)
Admission: RE | Admit: 2022-07-04 | Discharge: 2022-07-04 | Disposition: A | Discharge status: Home / Self Care | Payer: Medicaid Other | Source: Ambulatory Visit | Attending: Family | Admitting: Family

## 2022-07-04 DIAGNOSIS — R1031 Right lower quadrant pain: Secondary | ICD-10-CM | POA: Diagnosis present

## 2022-07-20 NOTE — Progress Notes (Signed)
Referring Provider: Sonny Masters, FNP Primary Care Physician:  Sonny Masters, FNP Primary GI Physician: Dr. Levon Hedger  Chief Complaint  Patient presents with   Diarrhea    HPI:   Logan Dawson is a 27 y.o. male with past medical history of ADHD, HTN, meningitis, asthma, H. pylori s/p eradication, chronic diarrhea following cholecystectomy, presenting today for follow-up of chronic diarrhea.  Last seen in the office 06/29/2021.  He had previously been started on Colestid 2 g daily, but reported no improvement in his diarrhea.  He was taking OTC antidiarrheals but still having 8-10 watery bowel movements daily and occasional nocturnal stools though this was infrequent.  Associated urgency and occasional fecal soiling.  Occasional abdominal pain, but not near as frequent as when he had H. pylori.  He was taking pantoprazole 20 mg daily for GERD.  He had CT scan of his abdomen and pelvis with no significant abnormalities.  Colestid was stopped and he was started on colesevelam twice daily with meals and scheduled for colonoscopy.  He was then sent in cholestyramine 4g 3 times daily instead of colesevelam in July 2023.  Colonoscopy 08/25/2021 with normal exam s/p biopsies which showed prep related changes.  No diagnostic features of IBD, microscopic colitis or collagenous colitis.  Today:  Cholecystectomy in 2019 or 2020 and has been having diarrhea ever since. Stools are [facility] 7, right after a meal. Can have 8-9 daily. No nocturnal stools. No brbpr or melena.  Not currently taking anything to help with diarrhea.  Reports he did take Questran, but only 1 or 2 times a day for about a week as he did not like the taste of it.  States it did help a little bit, but did not stop the diarrhea.  States he tried dicyclomine, but, "It wore off".  Overall, diarrhea is about the same as it always has been over the last few years.   Stool studies in 2020 with norovirus. Otherwise, GI panel and C.  difficile testing were negative. Can be with greasy foods or other foods.  Occasional nausea.  No vomiting. States his upper abdominal pain is not very bothersome. Primary concern is diarrhea and is very frustrated with his persistent symptoms.   No NSAIDs.  CT A/P without contrast January 2024 (care everywhere) with no acute abnormalities.     Past Medical History:  Diagnosis Date   ADHD (attention deficit hyperactivity disorder)    Anxiety    Asthma    Gastroenteritis    Hypertension    Left shoulder pain    Meningitis    Migraine    after skull fracture   Skull fracture (HCC)    Subarachnoid hemorrhage (HCC)     Past Surgical History:  Procedure Laterality Date   APPENDECTOMY     BIOPSY  08/16/2020   Procedure: BIOPSY;  Surgeon: Dolores Frame, MD;  [facility];  Service: Gastroenterology;;   BIOPSY  08/25/2021   Procedure: BIOPSY;  Surgeon: Dolores Frame, MD;  [facility];  Service: Gastroenterology;;   CHOLECYSTECTOMY     COLONOSCOPY WITH PROPOFOL N/A 08/25/2021   Procedure: COLONOSCOPY WITH PROPOFOL;  Surgeon: Dolores Frame, MD;  [facility];  Service: Gastroenterology;  Laterality: N/A;  12:30 ASA 1   ESOPHAGOGASTRODUODENOSCOPY (EGD) WITH PROPOFOL N/A 08/16/2020   Levon Hedger, H pylori positive   HERNIA REPAIR      Current Outpatient Medications  Medication Sig Dispense Refill   albuterol (  VENTOLIN HFA) 108 (90 Base) MCG/ACT inhaler Inhale into the lungs.     cetirizine (ZYRTEC) 5 MG chewable tablet Chew by mouth.     colestipol (COLESTID) 1 g tablet Take 2 tablets (2 g total) by mouth 2 (two) times daily. 120 tablet 1   cyclobenzaprine (FLEXERIL) 10 MG tablet Take by mouth.     FLUoxetine (PROZAC) 20 MG capsule 1 capsule Orally Once a day for 30 days     HYDROcodone-acetaminophen (NORCO) 10-325 MG tablet Take 1 tablet by mouth every 6 (six) hours as needed.     methocarbamol (ROBAXIN) 500 MG  tablet Take by mouth.     metoprolol succinate (TOPROL-XL) 50 MG 24 hr tablet Take by mouth.     Omega-3 Fatty Acids (OMEGA III EPA+DHA) 1000 MG CAPS Take by mouth.     pantoprazole (PROTONIX) 20 MG tablet Take 1 tablet (20 mg total) by mouth 2 (two) times daily. 60 tablet 0   No current facility-administered medications for this visit.    Allergies as of 07/23/2022 - Review Complete 07/23/2022  Allergen Reaction Noted   Sulfa antibiotics Hives and Rash 10/30/2010   Lisinopril Other (See Comments) 07/28/2019   Sulfamethoxazole  02/21/2022    Family History  Problem Relation Age of Onset   Diabetes Mother    COPD Mother    Diabetes Father    Hypertension Father    Heart disease Father     Social History   Socioeconomic History   Marital status: Single    Spouse name: Not on file   Number of children: Not on file   Years of education: Not on file   Highest education level: Not on file  Occupational History   Not on file  Tobacco Use   Smoking status: Never   Smokeless tobacco: Never  Vaping Use   Vaping Use: Never used  Substance and Sexual Activity   Alcohol use: Not Currently   Drug use: No   Sexual activity: Not Currently  Other Topics Concern   Not on file  Social History Narrative   Lives alone   Left handed   Caffeine: 8 sodas per day 12 oz each    Social Determinants of Health   Financial Resource Strain: Not on file  Food Insecurity: Not on file  Transportation Needs: Not on file  Physical Activity: Not on file  Stress: Not on file  Social Connections: Not on file    Review of Systems: Gen: Denies fever, chills, cold or flulike symptoms, presyncope, syncope. CV: Denies chest pain, palpitations. Resp: Denies dyspnea, cough.  GI: See HPI Heme: See HPI  Physical Exam: BP 138/82 (BP Location: Right Arm, Patient Position: Sitting, Cuff Size: Large)   Pulse (!) 120   Temp 98.2 F (36.8 C) (Temporal)   Ht 6' (1.829 m)   Wt (!) 325 lb (147.4 kg)    SpO2 97%   BMI 44.08 kg/m  General:   Alert and oriented. No distress noted. Pleasant and cooperative.  Head:  Normocephalic and atraumatic. Eyes:  Conjuctiva clear without scleral icterus. Heart:  S1, S2 present without murmurs appreciated. Lungs:  Clear to auscultation bilaterally. No wheezes, rales, or rhonchi. No distress.  Abdomen:  +BS, soft, non-tender and non-distended. No rebound or guarding. No HSM or masses noted. Msk:  Symmetrical without gross deformities. Normal posture. Extremities:  Without edema. Neurologic:  Alert and  oriented x4 Psych:  Normal mood and affect.    Assessment:  27 year old male with past  medical history of ADHD, HTN, meningitis, asthma, H. pylori s/p eradication, chronic diarrhea following cholecystectomy, presenting today for follow-up of chronic diarrhea.  Not currently taking anything to manage his symptoms and having up to 8 or 9 [place] 7 stools daily, typically postprandial.  No nocturnal stools, BRBPR, melena, unintentional weight loss.  Short trial of Questran though not taking as prescribed last year with slight improvement, but patient states he did not tolerate the taste.  Reports some improvement with dicyclomine previously, but, "it wore off".  Colonoscopy July 2023 with normal exam, colon biopsies negative for IBD, microscopic colitis.  Stool studies in 2020 with norovirus, otherwise GI panel and C. difficile were negative and he denies any significant change in his symptoms since the onset.  CT A/P without contrast January 2024 with no acute abnormalities.  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  May also has a component of IBS/postinfectious IBS.  Could have also developed a lactose intolerance.  II do not see that he is ever been screened for celiac disease and will check for this for completeness as well as screen for alpha gal.    Plan:  Start Colestid 2 g twice daily before meal.  Take other medications 1 hour before or 4  hours after Colestid. Recommended low-fat diet.  No fried, fatty, greasy foods.  Recommended poultry or fish only that is baked, boiled, broiled.  Recommended trial of lactose-free diet. IgA, TTG IgA, alpha gal panel Follow-up in 6 weeks.   Ermalinda Memos, PA-C [facility] Gastroenterology 07/23/2022   I have reviewed the note and agree with the APP's assessment as described in this progress note  Patient's medical plan did not cover coleveselam, which has shown to be more efficacious than other medicines for bile salt diarrhea. We will try again standing Colestid. If no improvement, could try coleveselam but he may need to pay this out of pocket.  Katrinka Blazing, MD Gastroenterology and Hepatology [facility] Gastroenterology

## 2022-07-23 ENCOUNTER — Encounter: Payer: Self-pay | Admitting: *Deleted

## 2022-07-23 ENCOUNTER — Encounter: Payer: Self-pay | Admitting: Gastroenterology

## 2022-07-23 ENCOUNTER — Ambulatory Visit (INDEPENDENT_AMBULATORY_CARE_PROVIDER_SITE_OTHER): Payer: Medicaid Other | Admitting: Gastroenterology

## 2022-07-23 VITALS — BP 138/82 | HR 120 | Temp 98.2°F | Ht 72.0 in | Wt 325.0 lb

## 2022-07-23 DIAGNOSIS — K529 Noninfective gastroenteritis and colitis, unspecified: Secondary | ICD-10-CM

## 2022-07-23 MED ORDER — COLESTIPOL HCL 1 G PO TABS: 2.0000 g | ORAL_TABLET | Freq: Two times a day (BID) | ORAL | 1 refills | Status: DC

## 2022-07-23 NOTE — Patient Instructions (Addendum)
I suspect your diarrhea is secondary to bile salt diarrhea.  This is due to you not having a gallbladder.  I would like for you to start Colestid 2 g twice daily before meal.   Your other medications will need to be taken 1 hour before or 4 hours after Colestid.  As we discussed, you will also need to follow a low-fat diet.   Avoid fried, fatty, greasy foods.  Recommend poultry or fish only. All meats should be baked, boiled, broiled.  You may also have developed lactose intolerance in the setting of chronic diarrhea.  Recommend following a lactose-free diet for now to see if this will help with your diarrhea.  Please go to [facility] to have blood work completed to screen for celiac disease and alpha gal.  I will plan to follow-up with you in about 6 weeks.  Do not hesitate to call sooner if you have questions or concerns.  It was nice to meet you today!  Ermalinda Memos, PA-C [facility] Gastroenterology

## 2022-07-28 ENCOUNTER — Encounter: Payer: Self-pay | Admitting: Gastroenterology

## 2022-09-01 NOTE — Progress Notes (Deleted)
Referring Provider: Sonny Masters, FNP Primary Care Physician:  Sonny Masters, FNP Primary GI Physician: Dr. Levon Hedger  No chief complaint on file.   HPI:   Logan Dawson is a 27 y.o. male with past medical history of ADHD, HTN, meningitis, asthma, H. pylori s/p eradication, GERD, chronic diarrhea following cholecystectomy, presenting today for follow-up of chronic diarrhea.   Patient previously underwent cholecystectomy in 2019 or 2020 and has been having trouble with diarrhea ever since with postprandial Bristol 7 stools, up to 8 or 9 bowel movements per day.  Stool studies in 2020 with norovirus.  Otherwise, GI panel and C. difficile testing were negative.  Colonoscopy 08/25/2021 with normal exam s/p biopsies which showed prep related changes. No diagnostic features of IBD, microscopic colitis or collagenous colitis.   CT A/P with contrast 03/26/2022:  Mild distension of the proximal jejunum, with scattered gas fluid levels throughout the large and small bowel. Overall, favored regional ileus and underlying gastroenteritis.   Last seen in our office 07/23/2022.  Diarrhea was uncontrolled as he was not taking anything to help with his symptoms.  Reported Questran helped slightly, but he was not taking as prescribed and discontinued as he could not tolerate the taste.  Reported dicyclomine previously provided some improvement, but "wore off".  Differentials included bile salt diarrhea, IBS/postinfectious IBS, development of lactose intolerance.  Plan to start Colestid 2 g twice daily before meal, low-fat/lactose-free diet, screen for celiac disease and alpha gal, follow-up in 6 weeks.  Labs were never completed.    Today:     Dr. Levon Hedger recommended trial of colesevelam if no improvement with Colestid ***   Past Medical History:  Diagnosis Date   ADHD (attention deficit hyperactivity disorder)    Anxiety    Asthma    Gastroenteritis    Hypertension    Left shoulder pain     Meningitis    Migraine    after skull fracture   Skull fracture (HCC)    Subarachnoid hemorrhage (HCC)     Past Surgical History:  Procedure Laterality Date   APPENDECTOMY     BIOPSY  08/16/2020   Procedure: BIOPSY;  Surgeon: Dolores Frame, MD;  [facility];  Service: Gastroenterology;;   BIOPSY  08/25/2021   Procedure: BIOPSY;  Surgeon: Dolores Frame, MD;  [facility];  Service: Gastroenterology;;   CHOLECYSTECTOMY     COLONOSCOPY WITH PROPOFOL N/A 08/25/2021   Procedure: COLONOSCOPY WITH PROPOFOL;  Surgeon: Dolores Frame, MD;  [facility];  Service: Gastroenterology;  Laterality: N/A;  12:30 ASA 1   ESOPHAGOGASTRODUODENOSCOPY (EGD) WITH PROPOFOL N/A 08/16/2020   Levon Hedger, H pylori positive   HERNIA REPAIR      Current Outpatient Medications  Medication Sig Dispense Refill   albuterol (VENTOLIN HFA) 108 (90 Base) MCG/ACT inhaler Inhale into the lungs.     cetirizine (ZYRTEC) 5 MG chewable tablet Chew by mouth.     colestipol (COLESTID) 1 g tablet Take 2 tablets (2 g total) by mouth 2 (two) times daily. 120 tablet 1   cyclobenzaprine (FLEXERIL) 10 MG tablet Take by mouth.     FLUoxetine (PROZAC) 20 MG capsule 1 capsule Orally Once a day for 30 days     HYDROcodone-acetaminophen (NORCO) 10-325 MG tablet Take 1 tablet by mouth every 6 (six) hours as needed.     methocarbamol (ROBAXIN) 500 MG tablet Take by mouth.     metoprolol succinate (TOPROL-XL) 50 MG  24 hr tablet Take by mouth.     Omega-3 Fatty Acids (OMEGA III EPA+DHA) 1000 MG CAPS Take by mouth.     pantoprazole (PROTONIX) 20 MG tablet Take 1 tablet (20 mg total) by mouth 2 (two) times daily. 60 tablet 0   No current facility-administered medications for this visit.    Allergies as of 09/03/2022 - Review Complete 07/23/2022  Allergen Reaction Noted   Sulfa antibiotics Hives and Rash 10/30/2010   Lisinopril Other (See Comments) 07/28/2019    Sulfamethoxazole  02/21/2022    Family History  Problem Relation Age of Onset   Diabetes Mother    COPD Mother    Diabetes Father    Hypertension Father    Heart disease Father     Social History   Socioeconomic History   Marital status: Single    Spouse name: Not on file   Number of children: Not on file   Years of education: Not on file   Highest education level: Not on file  Occupational History   Not on file  Tobacco Use   Smoking status: Never   Smokeless tobacco: Never  Vaping Use   Vaping status: Never Used  Substance and Sexual Activity   Alcohol use: Not Currently   Drug use: No   Sexual activity: Not Currently  Other Topics Concern   Not on file  Social History Narrative   Lives alone   Left handed   Caffeine: 8 sodas per day 12 oz each    Social Determinants of Health   Financial Resource Strain: Not on file  Food Insecurity: Not on file  Transportation Needs: Not on file  Physical Activity: Not on file  Stress: Not on file  Social Connections: Unknown (06/19/2021)   [facility] from [facility], [facility]   Social Network    Social Network: Not on file    Review of Systems: Gen: Denies fever, chills, anorexia. Denies fatigue, weakness, weight loss.  CV: Denies chest pain, palpitations, syncope, peripheral edema, and claudication. Resp: Denies dyspnea at rest, cough, wheezing, coughing up blood, and pleurisy. GI: Denies vomiting blood, jaundice, and fecal incontinence.   Denies dysphagia or odynophagia. Derm: Denies rash, itching, dry skin Psych: Denies depression, anxiety, memory loss, confusion. No homicidal or suicidal ideation.  Heme: Denies bruising, bleeding, and enlarged lymph nodes.  Physical Exam: There were no vitals taken for this visit. General:   Alert and oriented. No distress noted. Pleasant and cooperative.  Head:  Normocephalic and atraumatic. Eyes:  Conjuctiva clear without scleral icterus. Heart:  S1, S2 present without  murmurs appreciated. Lungs:  Clear to auscultation bilaterally. No wheezes, rales, or rhonchi. No distress.  Abdomen:  +BS, soft, non-tender and non-distended. No rebound or guarding. No HSM or masses noted. Msk:  Symmetrical without gross deformities. Normal posture. Extremities:  Without edema. Neurologic:  Alert and  oriented x4 Psych:  Normal mood and affect.    Assessment:     Plan:  ***   Ermalinda Memos, PA-C [facility] Gastroenterology 09/03/2022

## 2022-09-03 ENCOUNTER — Encounter: Payer: Self-pay | Admitting: Gastroenterology

## 2022-09-03 ENCOUNTER — Ambulatory Visit: Payer: Medicaid Other | Admitting: Gastroenterology

## 2022-09-18 ENCOUNTER — Encounter: Payer: Self-pay | Admitting: Family Medicine

## 2022-09-18 ENCOUNTER — Ambulatory Visit: Payer: Medicaid Other | Admitting: Family Medicine

## 2022-10-03 ENCOUNTER — Telehealth: Payer: Self-pay | Admitting: Family Medicine

## 2022-10-03 NOTE — Telephone Encounter (Signed)
Unable to reach pt over the phone, sent text msg informing pt of need to reschedule 10/04/22 appt - NP out

## 2022-10-03 NOTE — Progress Notes (Deleted)
PATIENT: Logan Dawson DOB: 1995-02-15  REASON FOR VISIT: follow up HISTORY FROM: patient  Virtual Visit via Telephone Note  I connected with [name] on 10/03/22 at  9:00 AM EDT by telephone and verified that I am speaking with the correct person using two identifiers.   I discussed the limitations, risks, security and privacy concerns of performing an evaluation and management service by telephone and the availability of in person appointments. I also discussed with the patient that there may be a patient responsible charge related to this service. The patient expressed understanding and agreed to proceed.   History of Present Illness:  10/03/22 ALL: JOLIN MILBERT is a 27 y.o. male here today for follow up.   History (copied from Dr Teofilo Pod previous note)   Dear Bonita Quin,    I saw your patient, Eland Folkerts, upon your kind request in my sleep clinic today for initial consultation of his sleep disorder, in particular, concern for underlying obstructive sleep apnea.  The patient is unaccompanied today.  As you know, Mr. Downham is a 27 year old right-handed gentleman with an underlying medical history of asthma, history of meningitis, history of subarachnoid hemorrhage and skull fracture in the remote past, history of hypertension, ADHD, and severe obesity with a BMI of over 40, who reports snoring and excessive daytime somnolence, as well as witnessed apneas per girlfriend.  I reviewed your office note from 05/04/2021.  His Epworth sleepiness score is 21 out of 94, fatigue severity score is 53 out of 63.  He has been sleepy during the day for the past few years.  He may sleep 8 or 10 hours but does not wake up rested.  He denies recurrent morning headaches but has woken up with a sense of gasping for air and with nighttime reflux symptoms.  He has sleep disruption from nocturia, about 5 or 6 times per average night.  He does not drink any alcohol, he is a non-smoker.  He has caffeine  in the form of soda and sweet tea about 5 or 6 servings per day on average.  He lives alone, he is a 54-year-old daughter and she stays with him every other week.  He works as a Arboriculturist for [employer].  He has an uncle with sleep apnea and a cousin with sleep apnea.  He has had weight gain over time, he is currently working on weight loss, he is currently not on Ozempic.  His bedtime is generally around 8 PM and rise time between 6 and 7 AM.    Observations/Objective:  Generalized: Well developed, in no acute distress  Mentation: Alert oriented to time, place, history taking. Follows all commands speech and language fluent   Assessment and Plan:  27 y.o. year old male  has a past medical history of ADHD (attention deficit hyperactivity disorder), Anxiety, Asthma, Gastroenteritis, Hypertension, Left shoulder pain, Meningitis, Migraine, Skull fracture (HCC), and Subarachnoid hemorrhage (HCC). here with  No diagnosis found.  No orders of the defined types were placed in this encounter.   No orders of the defined types were placed in this encounter.    Follow Up Instructions:  I discussed the assessment and treatment plan with the patient. The patient was provided an opportunity to ask questions and all were answered. The patient agreed with the plan and demonstrated an understanding of the instructions.   The patient was advised to call back or seek an in-person evaluation if the symptoms worsen or if  the condition fails to improve as anticipated.  I provided *** minutes of non-face-to-face time during this encounter. Patient located at their place of residence during Mychart visit. Provider is in the office.    Shawnie Dapper, NP

## 2022-10-04 ENCOUNTER — Ambulatory Visit: Payer: Medicaid Other | Admitting: Family Medicine

## 2022-12-07 ENCOUNTER — Telehealth: Payer: Self-pay | Admitting: *Deleted

## 2022-12-07 NOTE — Telephone Encounter (Signed)
Pt called and needs a refill on Colestipol sent to [facility]. Pt last OV 07/23/2022

## 2022-12-09 ENCOUNTER — Other Ambulatory Visit: Payer: Self-pay | Admitting: Gastroenterology

## 2022-12-09 DIAGNOSIS — K529 Noninfective gastroenteritis and colitis, unspecified: Secondary | ICD-10-CM

## 2022-12-09 MED ORDER — COLESTIPOL HCL 1 G PO TABS: 2.0000 g | ORAL_TABLET | Freq: Two times a day (BID) | ORAL | 5 refills | Status: DC

## 2022-12-09 NOTE — Telephone Encounter (Signed)
Rx sent 

## 2022-12-26 ENCOUNTER — Other Ambulatory Visit: Payer: Self-pay | Admitting: Family Medicine

## 2022-12-26 DIAGNOSIS — R1903 Right lower quadrant abdominal swelling, mass and lump: Secondary | ICD-10-CM

## 2023-02-07 NOTE — ED Triage Notes (Signed)
 Pt c/o back pain and right shoulder pain x 1 day after lifting boxes at work. Pt states that he heard a pop.

## 2023-02-08 NOTE — ED Provider Notes (Signed)
 Emergency Department Provider Note    ED Clinical Impression   Final diagnoses:  Strain of lumbar region, initial encounter (Primary)  Strain of right shoulder, initial encounter    ED Assessment/Plan    Condition: Stable Disposition: Discharge  This chart has been completed using Dragon Medical Dictation software, and while attempts have been made to ensure accuracy, certain words and phrases may not be transcribed as intended.   History   Chief Complaint  Patient presents with  . Shoulder Pain  . Back Pain   HPI  Logan Dawson is a 28 y.o. male  who presents today to the  emergency department complaining of low back pain and right shoulder pain.  Patient states he was lifting a heavy box at work when he felt a pull in his low back.  He states a box of monitor for also history she started to stop his room for 11-day course to pull over his right shoulder.  He denies any bowel or bladder incontinence.  He describes symptoms as moderate.  Pain is worse with movement.    Allergies: is allergic to sulfa (sulfonamide antibiotics), lisinopril, and sulfamethoxazole. Medications: has a current medication list which includes the following long-term medication(s): albuterol , colestipol , duloxetine, famotidine , gabapentin, and metoprolol  succinate. PMHx:  has a past medical history of Anxiety, Erectile dysfunction, GERD (gastroesophageal reflux disease), Hypertension, and Skull fracture (CMS-HCC). PSHx:  has a past surgical history that includes Cholecystectomy; Appendectomy; Abdominal surgery; and Hernia repair. SocHx:  reports that he has never smoked. He has never used smokeless tobacco. He reports that he does not currently use alcohol. He reports that he does not use drugs. Allergies, Medications, Medical, Surgical, and Social History were reviewed as documented above.   Social Drivers of  Health with Concerns   Food Insecurity: Not on file  Internet Connectivity: Not on file  Housing/Utilities: Not on file  Transportation Needs: Not on file  Interpersonal Safety: Not on file  Physical Activity: Not on file  Intimate Partner Violence: Unknown (05/09/2021)   Received from [facility], [facility]   HITS   . Physically Hurt: Not on file   . Insult or Talk Down To: Not on file   . Threaten Physical Harm: Not on file   . Scream or Curse: Not on file  Stress: Not on file  Substance Use: Not on file (12/10/2022)  Social Connections: Unknown (06/19/2021)   Received from [facility], [facility]   Social Network   . Social Network: Not on Actuary Strain: Not on file  Health Literacy: Medium Risk (09/07/2020)   Health Literacy   . : Rarely     Review Of Systems  Review of Systems  Constitutional:  Negative for fever.  HENT:  Negative for congestion.   Respiratory:  Negative for chest tightness and shortness of breath.   Cardiovascular:  Negative for chest pain.  Gastrointestinal:  Negative for abdominal pain.  Musculoskeletal:  Positive for back pain.  Skin:  Negative for color change.  Psychiatric/Behavioral:  Negative for behavioral problems.   All other systems reviewed and are negative.   Physical Exam   BP 140/72   Pulse 100   Temp 36.7 C (98 F) (Oral)   Resp 20   Ht (!) 15.2 cm (6)  Wt (!) 156 kg (344 lb)   SpO2 98%   BMI 6718.29 kg/m   Physical Exam Vitals and nursing note reviewed.  Constitutional:      General: He is not in acute distress. HENT:     Head: Normocephalic.  Eyes:     Conjunctiva/sclera: Conjunctivae normal.  Cardiovascular:     Rate and Rhythm: Regular rhythm.     Pulses: Normal pulses.     Heart sounds: Normal heart sounds.  Pulmonary:     Effort: No respiratory distress.     Breath sounds: Normal breath sounds.  Abdominal:     General: There is no distension.  Musculoskeletal:        General:  Tenderness present. No deformity.     Comments: There is slight lumbar paravertebral tenderness.  No step-offs.  No saddle anesthesia.  Negative straight leg raise test.  There is some lateral tenderness of the right shoulder.  Full range of motion.  No deformity.  Skin:    General: Skin is warm.     Capillary Refill: Capillary refill takes 2 to 3 seconds.     Comments: Normal cap refill.  Neurological:     General: No focal deficit present.     Mental Status: He is oriented to person, place, and time.  Psychiatric:        Mood and Affect: Mood normal.     ED Course  Medical Decision Making Differential diagnosis includes lumbar strain versus less likely occult fracture.  2:19 AM Patient is well-appearing he feels better.  He is stable for discharge.  I have reviewed my clinical findings and studies and my clinical impression with the patient. The patient has expressed understanding that at this time there is no evidence for a more malignant underlying process, but the patient also understands that early in the process of a condition such as this, an initial workup can be falsely reassuring. I have counseled the patient and discussed follow-up with the patient, stressing the importance of appropriate follow-up. I have also counseled the patient to return if worse or any concerns. Routine discharge counseling was given to the patient and the patient understands that worsening, changing or persistent symptoms should prompt an immediate call or follow up with their primary physician or return to the emergency department for reevaluation. Patient has expressed understanding.     Problems Addressed: Strain of lumbar region, initial encounter: acute illness or injury that poses a threat to life or bodily functions Strain of right shoulder, initial encounter: acute illness or injury that poses a threat to life or bodily functions  Amount and/or Complexity of Data Reviewed Radiology: ordered  and independent interpretation performed. Decision-making details documented in ED Course.  Risk Prescription drug management. Decision regarding hospitalization.     Procedures   No results found for this visit on 02/08/23 (from the past 4464 hours).   ED Results No results found for any visits on 02/08/23. XR Lumbar Spine 2 or 3 Views Result Date: 02/08/2023 CLINICAL DATA:  Low back pain following heavy lifting, initial encounter EXAM: LUMBAR SPINE - 3 VIEW COMPARISON:  None Available. FINDINGS: Five lumbar type vertebral bodies are well visualized. Vertebral body height is well maintained. No anterolisthesis is seen. No soft tissue abnormality is noted.   No acute abnormality noted. Electronically Signed   By: Oneil Devonshire M.D.   On: 02/08/2023 01:28   XR Shoulder 3 Or More Views Right Result Date: 02/08/2023 CLINICAL DATA:  Shoulder pain following  heavy lifting, initial encounter EXAM: DG SHOULDER 3+ VIEWS RIGHT COMPARISON:  None Available. FINDINGS: There is no evidence of fracture or dislocation. There is no evidence of arthropathy or other focal bone abnormality. Soft tissues are unremarkable.   No acute abnormality noted. Electronically Signed   By: Oneil Devonshire M.D.   On: 02/08/2023 01:28    Medications Administered:  Medications  ketorolac  (TORADOL ) injection 30 mg (30 mg Intramuscular Given 02/08/23 0125)  methocarbamol  (ROBAXIN ) tablet 500 mg (500 mg Oral Given 02/08/23 0125)  traMADol  (ULTRAM ) tablet 50 mg (50 mg Oral Given 02/08/23 0125)    Discharge Medications (Medications Prescribed during this  ED visit and Patient's Home Medications) :    Your Medication List     CHANGE how you take these medications    methocarbamol  500 MG tablet Commonly known as: ROBAXIN  Take 2 tablets (1,000 mg total) by mouth Three (3) times a day as needed. What changed: Another medication with the same name was added. Make sure you understand how and when to take each.   methocarbamol   500 MG tablet Commonly known as: ROBAXIN  Take 1 tablet (500 mg total) by mouth two (2) times a day for 10 days. What changed: You were already taking a medication with the same name, and this prescription was added. Make sure you understand how and when to take each.   naproxen  500 MG tablet Commonly known as: NAPROSYN  Take 1 tablet (500 mg total) by mouth two (2) times a day as needed (pain). What changed: Another medication with the same name was added. Make sure you understand how and when to take each.   naproxen  500 MG tablet Commonly known as: NAPROSYN  Take 1 tablet (500 mg total) by mouth two (2) times a day as needed (pain). What changed: You were already taking a medication with the same name, and this prescription was added. Make sure you understand how and when to take each.       ASK your doctor about these medications    albuterol  90 mcg/actuation inhaler Commonly known as: PROVENTIL  HFA;VENTOLIN  HFA Inhale 2 puffs every four (4) hours as needed for wheezing.   colestipol  1 gram tablet Commonly known as: COLESTID  TAKE TWO (2) TABLETS BY MOUTH DAILY   cyclobenzaprine  10 MG tablet Commonly known as: FLEXERIL  Take 1 tablet (10 mg total) by mouth two (2) times a day as needed for muscle spasms.   DULoxetine 30 MG capsule Commonly known as: CYMBALTA Take 2 capsules (60 mg total) by mouth daily.   famotidine  20 MG tablet Commonly known as: PEPCID  Take 1 tablet (20 mg total) by mouth Two (2) times a day for 15 days.   FLUoxetine  20 MG capsule Commonly known as: PROZAC  Take 1 capsule (20 mg total) by mouth daily.   gabapentin 300 MG capsule Commonly known as: NEURONTIN   HYDROcodone -acetaminophen  10-325 mg per tablet Commonly known as: NORCO 10-325 Take 1 tablet by mouth every six (6) hours as needed for pain.   meloxicam  7.5 MG tablet Commonly known as: MOBIC  Take 1 tablet (7.5 mg total) by mouth two (2) times a day as needed for pain.   metoPROLOL   succinate 50 MG 24 hr tablet Commonly known as: Toprol -XL Take 1 tablet (50 mg total) by mouth in the morning.   oxyCODONE -acetaminophen  5-325 mg per tablet Commonly known as: PERCOCET Take 1 tablet by mouth Three (3) times a day as needed.   pantoprazole  40 MG tablet Commonly known as: Protonix  Take 1  tablet (40 mg total) by mouth daily.   sildenafil  100 MG tablet Commonly known as: VIAGRA  1 tablet by mouth 1 hour prior to intercourse          Cherie Ardeen Hanger, MD 02/08/23 [phone]

## 2023-03-10 ENCOUNTER — Emergency Department (HOSPITAL_COMMUNITY): Payer: Self-pay

## 2023-03-10 ENCOUNTER — Other Ambulatory Visit: Payer: Self-pay

## 2023-03-10 ENCOUNTER — Emergency Department (HOSPITAL_COMMUNITY)
Admission: EM | Admit: 2023-03-10 | Discharge: 2023-03-10 | Disposition: A | Discharge status: Home / Self Care | Payer: Self-pay

## 2023-03-10 DIAGNOSIS — R109 Unspecified abdominal pain: Secondary | ICD-10-CM

## 2023-03-10 DIAGNOSIS — Z79899 Other long term (current) drug therapy: Secondary | ICD-10-CM | POA: Insufficient documentation

## 2023-03-10 DIAGNOSIS — I10 Essential (primary) hypertension: Secondary | ICD-10-CM | POA: Insufficient documentation

## 2023-03-10 DIAGNOSIS — R0781 Pleurodynia: Secondary | ICD-10-CM | POA: Insufficient documentation

## 2023-03-10 DIAGNOSIS — R1011 Right upper quadrant pain: Secondary | ICD-10-CM | POA: Diagnosis not present

## 2023-03-10 DIAGNOSIS — R7401 Elevation of levels of liver transaminase levels: Secondary | ICD-10-CM | POA: Diagnosis not present

## 2023-03-10 LAB — COMPREHENSIVE METABOLIC PANEL
ALT: 48 U/L — ABNORMAL HIGH (ref 0–44)
AST: 58 U/L — ABNORMAL HIGH (ref 15–41)
Albumin: 3.7 g/dL (ref 3.5–5.0)
Alkaline Phosphatase: 58 U/L (ref 38–126)
Anion gap: 10 (ref 5–15)
BUN: 15 mg/dL (ref 6–20)
CO2: 25 mmol/L (ref 22–32)
Calcium: 8.9 mg/dL (ref 8.9–10.3)
Chloride: 98 mmol/L (ref 98–111)
Creatinine, Ser: 0.83 mg/dL (ref 0.61–1.24)
GFR, Estimated: >60 mL/min (ref >60)
Glucose, Bld: 169 mg/dL — ABNORMAL HIGH (ref 70–99)
Potassium: 3.8 mmol/L (ref 3.5–5.1)
Sodium: 133 mmol/L — ABNORMAL LOW (ref 135–145)
Total Bilirubin: 1 mg/dL (ref 0.0–1.2)
Total Protein: 7.5 g/dL (ref 6.5–8.1)

## 2023-03-10 LAB — CBC WITH DIFFERENTIAL/PLATELET
Abs Immature Granulocytes: 0.08 10*3/uL — ABNORMAL HIGH (ref 0.00–0.07)
Basophils Absolute: 0.1 10*3/uL (ref 0.0–0.1)
Basophils Relative: 1 %
Eosinophils Absolute: 0.1 10*3/uL (ref 0.0–0.5)
Eosinophils Relative: 0 %
HCT: 49 % (ref 39.0–52.0)
Hemoglobin: 16.5 g/dL (ref 13.0–17.0)
Immature Granulocytes: 1 %
Lymphocytes Relative: 22 %
Lymphs Abs: 3 10*3/uL (ref 0.7–4.0)
MCH: 29.4 pg (ref 26.0–34.0)
MCHC: 33.7 g/dL (ref 30.0–36.0)
MCV: 87.3 fL (ref 80.0–100.0)
Monocytes Absolute: 0.9 10*3/uL (ref 0.1–1.0)
Monocytes Relative: 7 %
Neutro Abs: 9.6 10*3/uL — ABNORMAL HIGH (ref 1.7–7.7)
Neutrophils Relative %: 69 %
Platelets: 290 10*3/uL (ref 150–400)
RBC: 5.61 MIL/uL (ref 4.22–5.81)
RDW: 12.9 % (ref 11.5–15.5)
WBC: 13.7 10*3/uL — ABNORMAL HIGH (ref 4.0–10.5)
nRBC: 0 % (ref 0.0–0.2)

## 2023-03-10 LAB — URINALYSIS, ROUTINE W REFLEX MICROSCOPIC
Bilirubin Urine: NEGATIVE
Glucose, UA: NEGATIVE mg/dL
Hgb urine dipstick: NEGATIVE
Ketones, ur: NEGATIVE mg/dL
Leukocytes,Ua: NEGATIVE
Nitrite: NEGATIVE
Protein, ur: NEGATIVE mg/dL
Specific Gravity, Urine: 1.011 (ref 1.005–1.030)
pH: 6 (ref 5.0–8.0)

## 2023-03-10 LAB — TROPONIN I (HIGH SENSITIVITY): Troponin I (High Sensitivity): 6 ng/L (ref <18)

## 2023-03-10 LAB — LIPASE, BLOOD: Lipase: 44 U/L (ref 11–51)

## 2023-03-10 MED ORDER — ONDANSETRON HCL 4 MG/2ML IJ SOLN
4.0000 mg | Freq: Once | INTRAMUSCULAR | Status: AC
  Administered 2023-03-10: 4 mg via INTRAVENOUS
  Filled 2023-03-10: qty 2

## 2023-03-10 MED ORDER — MORPHINE SULFATE (PF) 4 MG/ML IV SOLN
4.0000 mg | Freq: Once | INTRAVENOUS | Status: AC
  Administered 2023-03-10: 4 mg via INTRAVENOUS
  Filled 2023-03-10: qty 1

## 2023-03-10 MED ORDER — KETOROLAC TROMETHAMINE 15 MG/ML IJ SOLN
15.0000 mg | Freq: Once | INTRAMUSCULAR | Status: AC
  Administered 2023-03-10: 15 mg via INTRAVENOUS
  Filled 2023-03-10: qty 1

## 2023-03-10 MED ORDER — KETOROLAC TROMETHAMINE 10 MG PO TABS: 10.0000 mg | ORAL_TABLET | Freq: Four times a day (QID) | ORAL | 0 refills | Status: AC | PRN

## 2023-03-10 MED ORDER — ALUM & MAG HYDROXIDE-SIMETH 200-200-20 MG/5ML PO SUSP
30.0000 mL | Freq: Once | ORAL | Status: AC
  Administered 2023-03-10: 30 mL via ORAL
  Filled 2023-03-10: qty 30

## 2023-03-10 NOTE — ED Triage Notes (Signed)
Pt c/o RUQ pain "under my rib cage" x 4-5 days ago. States this started when he had a "head cold". Also stated he went to [facility] "either Thursday or Friday and they did a CT scan and said it was my liver, I had fatty liver and they stated they thought I also had a viral infection" states pain is worse now, especially when twisting, laying, standing. Rates pain 8/10, sharp. Pt stated he has tried tylenol and NSAIds without relief

## 2023-03-10 NOTE — ED Provider Notes (Signed)
[place] EMERGENCY DEPARTMENT AT [facility] Provider Note   CSN: [id number] Arrival date & time: 03/10/23  0930     History  Chief Complaint  Patient presents with   Abdominal Pain    Logan Dawson is a 28 y.o. male.  28 year old male with past medical history of GERD and hypertension presents the emergency department today with right sided flank pain.  The patient states this began this morning.  He states he has similar episode last week.  The patient was evaluated at [facility].  He was told that he had a kidney stone and also fatty liver disease.  He was unclear if this was a ureteral stone or not.  The patient reports that this occurred this morning after he sneezed.  He denies any recent cough.  There is report that the pain is worse with movement.  Denies any hematuria.   Abdominal Pain      Home Medications Prior to Admission medications   Medication Sig Start Date End Date Taking? Authorizing Provider  ketorolac (TORADOL) 10 MG tablet Take 1 tablet (10 mg total) by mouth every 6 (six) hours as needed. 03/10/23  Yes Durwin Glaze, MD  albuterol (VENTOLIN HFA) 108 (90 Base) MCG/ACT inhaler Inhale into the lungs. 01/21/12   [provider]  cetirizine (ZYRTEC) 5 MG chewable tablet Chew by mouth. 01/21/12   [provider]  colestipol (COLESTID) 1 g tablet Take 2 tablets (2 g total) by mouth 2 (two) times daily. 12/09/22   Letta Median, PA-C  cyclobenzaprine (FLEXERIL) 10 MG tablet Take by mouth. 07/14/22   [provider]  FLUoxetine (PROZAC) 20 MG capsule 1 capsule Orally Once a day for 30 days 06/09/19   [provider]  HYDROcodone-acetaminophen (NORCO) 10-325 MG tablet Take 1 tablet by mouth every 6 (six) hours as needed. 07/19/22   [provider]  methocarbamol (ROBAXIN) 500 MG tablet Take by mouth. 07/13/22   [provider]  metoprolol succinate (TOPROL-XL) 50 MG 24 hr tablet Take by mouth. 02/23/21    [provider]  Omega-3 Fatty Acids (OMEGA III EPA+DHA) 1000 MG CAPS Take by mouth. 02/27/21   [provider]  pantoprazole (PROTONIX) 20 MG tablet Take 1 tablet (20 mg total) by mouth 2 (two) times daily. 04/15/21 07/23/22  Linwood Dibbles, MD      Allergies    Sulfa antibiotics, Lisinopril, and Sulfamethoxazole    Review of Systems   Review of Systems  Gastrointestinal:  Positive for abdominal pain.  All other systems reviewed and are negative.   Physical Exam Updated Vital Signs BP 100/82   Pulse 88   Temp 97.7 F (36.5 C) (Oral)   Resp (!) 23   SpO2 99%  Physical Exam Vitals and nursing note reviewed.   Gen: NAD Eyes: PERRL, EOMI HEENT: no oropharyngeal swelling Neck: trachea midline Resp: clear to auscultation bilaterally Card: RRR, no murmurs, rubs, or gallops Abd: Patient is tender over the right upper quadrant right-sided CVA tenderness noted, he is also tender over the lower ribs on the right Extremities: no calf tenderness, no edema Vascular: 2+ radial pulses bilaterally, 2+ DP pulses bilaterally Skin: no rashes Psyc: acting appropriately   ED Results / Procedures / Treatments   Labs (all labs ordered are listed, but only abnormal results are displayed) Labs Reviewed  CBC WITH DIFFERENTIAL/PLATELET - Abnormal; Notable for the following components:      Result Value   WBC 13.7 (*)  Neutro Abs 9.6 (*)    Abs Immature Granulocytes 0.08 (*)    All other components within normal limits  COMPREHENSIVE METABOLIC PANEL - Abnormal; Notable for the following components:   Sodium 133 (*)    Glucose, Bld 169 (*)    AST 58 (*)    ALT 48 (*)    All other components within normal limits  LIPASE, BLOOD  URINALYSIS, ROUTINE W REFLEX MICROSCOPIC  TROPONIN I (HIGH SENSITIVITY)    EKG EKG Interpretation Date/Time:  Sunday March 10 2023 11:28:12 EST Ventricular Rate:  90 PR Interval:  130 QRS Duration:  98 QT Interval:  361 QTC  Calculation: 442 R Axis:   59  Text Interpretation: Sinus rhythm Confirmed by Beckey Downing [id number]) on 03/10/2023 11:30:11 AM  Radiology CT ABDOMEN PELVIS WO CONTRAST Result Date: 03/10/2023 CLINICAL DATA:  Flank pain EXAM: CT ABDOMEN AND PELVIS WITHOUT CONTRAST TECHNIQUE: Multidetector CT imaging of the abdomen and pelvis was performed following the standard protocol without IV contrast. RADIATION DOSE REDUCTION: This exam was performed according to the departmental dose-optimization program which includes automated exposure control, adjustment of the mA and/or kV according to patient size and/or use of iterative reconstruction technique. COMPARISON:  03/07/2023 FINDINGS: Lower chest: No acute abnormality. Hepatobiliary: Liver measures 20 cm in length. Diffusely decreased attenuation of the hepatic parenchyma. No focal liver lesion identified on noncontrast imaging. Prior cholecystectomy. No biliary dilatation. Pancreas: Unremarkable. No pancreatic ductal dilatation or surrounding inflammatory changes. Spleen: Normal in size without focal abnormality. Adrenals/Urinary Tract: Adrenal glands are unremarkable. Kidneys are normal, without renal calculi, focal lesion, or hydronephrosis. No ureteral calculi. Bladder is unremarkable. Stomach/Bowel: Stomach is within normal limits. Appendix is surgically absent. No evidence of bowel wall thickening, distention, or inflammatory changes. Vascular/Lymphatic: No significant vascular findings are present. No enlarged abdominal or pelvic lymph nodes. Reproductive: Prostate is unremarkable. Other: No free fluid. No abdominopelvic fluid collection. No pneumoperitoneum. Musculoskeletal: No acute or significant osseous findings. IMPRESSION: 1. No acute abdominopelvic findings. Specifically, no evidence of urolithiasis or obstructive uropathy. 2. Hepatomegaly and hepatic steatosis. Electronically Signed   By: Duanne Guess D.O.   On: 03/10/2023 10:51   DG Chest Portable 1  View Result Date: 03/10/2023 CLINICAL DATA:  Acute right upper quadrant abdominal pain. EXAM: PORTABLE CHEST 1 VIEW COMPARISON:  March 07, 2023. FINDINGS: The heart size and mediastinal contours are within normal limits. Both lungs are clear. The visualized skeletal structures are unremarkable. IMPRESSION: No active disease. Electronically Signed   By: Lupita Raider M.D.   On: 03/10/2023 10:44    Procedures Procedures    Medications Ordered in ED Medications  morphine (PF) 4 MG/ML injection 4 mg (4 mg Intravenous Given 03/10/23 1017)  ondansetron (ZOFRAN) injection 4 mg (4 mg Intravenous Given 03/10/23 1017)  ketorolac (TORADOL) 15 MG/ML injection 15 mg (15 mg Intravenous Given 03/10/23 1137)  alum & mag hydroxide-simeth (MAALOX/MYLANTA) 200-200-20 MG/5ML suspension 30 mL (30 mLs Oral Given 03/10/23 1137)    ED Course/ Medical Decision Making/ A&P                                 Medical Decision Making 28 year old male with past medical history of hypertension and GERD presenting to the emergency department today right-sided flank and abdominal pain.  I will further evaluate patient here with basic labs including LFTs and lipase to evaluate for hepatobiliary otology or pancreatitis.  Will obtain a urinalysis  to evaluate for pyelonephritis as well as a Noncon CT scan to evaluate for ureterolithiasis, obstruction, or other intra-abdominal pathology.  Will also obtain a chest x-ray to evaluate for rib fracture or pneumothorax.  I will give patient morphine Zofran for symptoms and reevaluate for ultimate disposition.  The patient's labs here are reassuring.  CT scan does not show any acute findings.  An EKG and troponin were added on to evaluate for cardiac etiology which were also negative.  The patient's urinalysis was negative.  I suspect is likely due to musculoskeletal pain as it is very reproducible here on exam.  Think the stable for discharge.  Amount and/or Complexity of Data Reviewed Labs:  ordered. Radiology: ordered.  Risk OTC drugs. Prescription drug management.           Final Clinical Impression(s) / ED Diagnoses Final diagnoses:  Flank pain    Rx / DC Orders ED Discharge Orders          Ordered    ketorolac (TORADOL) 10 MG tablet  Every 6 hours PRN        03/10/23 1435              Durwin Glaze, MD 03/10/23 1436

## 2023-03-10 NOTE — Discharge Instructions (Signed)
Your workup today was reassuring.  Your pain may be musculoskeletal.  Take the Toradol as needed for pain.  Please follow-up with your doctor for reevaluation.  Return to the ER for worsening symptoms.

## 2023-03-10 NOTE — Progress Notes (Unsigned)
Referring Provider: No ref. provider found Primary Care Physician:  No primary care provider on file. Primary GI Physician: Dr. Levon Hedger  No chief complaint on file.   HPI:   Logan Dawson is a 28 y.o. male with past medical history of ADHD, HTN, meningitis, asthma, H. pylori s/p eradication, chronic diarrhea following cholecystectomy.  Colonoscopy 08/25/2021 with normal exam s/p biopsies which showed prep related changes. No diagnostic features of IBD, microscopic colitis or collagenous colitis.   No real improvement with dicyclomine or Colestid 2g daily in the past. Hasn't taken Questran for any length of time due to taste.   Previously recommended celiac screen and alpha gal testing in June 2024, but this wasn't completed.    He is presenting today for ER follow-up.   Patient was seen at [facility] for right-sided upper quadrant flank pain and tenderness for 2 to 3 days. Labs remarkable for Lipase 92, WBC 17.3, glucose 227. CT A/P without contrast showed no acute findings, hematomegaly with hepatic steatosis, small fat containing umbilical hernia. Small volume of stool in the colon. Appears he was prescribed phenergan and ibuprofen.    Today:     Past Medical History:  Diagnosis Date   ADHD (attention deficit hyperactivity disorder)    Anxiety    Asthma    Gastroenteritis    Hypertension    Left shoulder pain    Meningitis    Migraine    after skull fracture   Skull fracture (HCC)    Subarachnoid hemorrhage (HCC)     Past Surgical History:  Procedure Laterality Date   APPENDECTOMY     BIOPSY  08/16/2020   Procedure: BIOPSY;  Surgeon: Dolores Frame, MD;  Location: [facility];  Service: Gastroenterology;;   BIOPSY  08/25/2021   Procedure: BIOPSY;  Surgeon: Dolores Frame, MD;  Location: [facility];  Service: Gastroenterology;;   CHOLECYSTECTOMY     COLONOSCOPY WITH PROPOFOL N/A 08/25/2021   Procedure: COLONOSCOPY WITH PROPOFOL;   Surgeon: Dolores Frame, MD;  Location: [facility];  Service: Gastroenterology;  Laterality: N/A;  12:30 ASA 1   ESOPHAGOGASTRODUODENOSCOPY (EGD) WITH PROPOFOL N/A 08/16/2020   Levon Hedger, H pylori positive   HERNIA REPAIR      Current Outpatient Medications  Medication Sig Dispense Refill   albuterol (VENTOLIN HFA) 108 (90 Base) MCG/ACT inhaler Inhale into the lungs.     cetirizine (ZYRTEC) 5 MG chewable tablet Chew by mouth.     colestipol (COLESTID) 1 g tablet Take 2 tablets (2 g total) by mouth 2 (two) times daily. 120 tablet 5   cyclobenzaprine (FLEXERIL) 10 MG tablet Take by mouth.     FLUoxetine (PROZAC) 20 MG capsule 1 capsule Orally Once a day for 30 days     HYDROcodone-acetaminophen (NORCO) 10-325 MG tablet Take 1 tablet by mouth every 6 (six) hours as needed.     methocarbamol (ROBAXIN) 500 MG tablet Take by mouth.     metoprolol succinate (TOPROL-XL) 50 MG 24 hr tablet Take by mouth.     Omega-3 Fatty Acids (OMEGA III EPA+DHA) 1000 MG CAPS Take by mouth.     pantoprazole (PROTONIX) 20 MG tablet Take 1 tablet (20 mg total) by mouth 2 (two) times daily. 60 tablet 0   No current facility-administered medications for this visit.    Allergies as of 03/11/2023 - Review Complete 07/23/2022  Allergen Reaction Noted   Sulfa antibiotics Hives and Rash 10/30/2010   Lisinopril Other (See Comments)  07/28/2019   Sulfamethoxazole  02/21/2022    Family History  Problem Relation Age of Onset   Diabetes Mother    COPD Mother    Diabetes Father    Hypertension Father    Heart disease Father     Social History   Socioeconomic History   Marital status: Single    Spouse name: Not on file   Number of children: Not on file   Years of education: Not on file   Highest education level: Not on file  Occupational History   Not on file  Tobacco Use   Smoking status: Never   Smokeless tobacco: Never  Vaping Use   Vaping status: Never Used  Substance and Sexual Activity    Alcohol use: Not Currently   Drug use: No   Sexual activity: Not Currently  Other Topics Concern   Not on file  Social History Narrative   Lives alone   Left handed   Caffeine: 8 sodas per day 12 oz each    Social Drivers of Health   Financial Resource Strain: Not on file  Food Insecurity: Not on file  Transportation Needs: Not on file  Physical Activity: Not on file  Stress: Not on file  Social Connections: Unknown (06/19/2021)   Received from [facility]   Social Network    Social Network: Not on file    Review of Systems: Gen: Denies fever, chills, anorexia. Denies fatigue, weakness, weight loss.  CV: Denies chest pain, palpitations, syncope, peripheral edema, and claudication. Resp: Denies dyspnea at rest, cough, wheezing, coughing up blood, and pleurisy. GI: Denies vomiting blood, jaundice, and fecal incontinence.   Denies dysphagia or odynophagia. Derm: Denies rash, itching, dry skin Psych: Denies depression, anxiety, memory loss, confusion. No homicidal or suicidal ideation.  Heme: Denies bruising, bleeding, and enlarged lymph nodes.  Physical Exam: There were no vitals taken for this visit. General:   Alert and oriented. No distress noted. Pleasant and cooperative.  Head:  Normocephalic and atraumatic. Eyes:  Conjuctiva clear without scleral icterus. Heart:  S1, S2 present without murmurs appreciated. Lungs:  Clear to auscultation bilaterally. No wheezes, rales, or rhonchi. No distress.  Abdomen:  +BS, soft, non-tender and non-distended. No rebound or guarding. No HSM or masses noted. Msk:  Symmetrical without gross deformities. Normal posture. Extremities:  Without edema. Neurologic:  Alert and  oriented x4 Psych:  Normal mood and affect.    Assessment:     Plan:  ***   Ermalinda Memos, PA-C [facility]astroenter[facility] 03/11/2023

## 2023-03-11 ENCOUNTER — Ambulatory Visit (INDEPENDENT_AMBULATORY_CARE_PROVIDER_SITE_OTHER): Payer: Self-pay | Admitting: Gastroenterology

## 2023-03-11 ENCOUNTER — Encounter: Payer: Self-pay | Admitting: Gastroenterology

## 2023-03-11 VITALS — BP 136/89 | HR 96 | Temp 97.6°F | Ht 72.0 in | Wt 339.0 lb

## 2023-03-11 DIAGNOSIS — S29011A Strain of muscle and tendon of front wall of thorax, initial encounter: Secondary | ICD-10-CM

## 2023-03-11 DIAGNOSIS — K760 Fatty (change of) liver, not elsewhere classified: Secondary | ICD-10-CM

## 2023-03-11 DIAGNOSIS — K9089 Other intestinal malabsorption: Secondary | ICD-10-CM

## 2023-03-11 DIAGNOSIS — Z6841 Body Mass Index (BMI) 40.0 and over, adult: Secondary | ICD-10-CM

## 2023-03-11 DIAGNOSIS — T148XXA Other injury of unspecified body region, initial encounter: Secondary | ICD-10-CM

## 2023-03-11 DIAGNOSIS — R7989 Other specified abnormal findings of blood chemistry: Secondary | ICD-10-CM

## 2023-03-11 NOTE — Patient Instructions (Signed)
I suspect that your right upper sided pain is related to muscle strain in the setting of cough.  You can take ibuprofen 400 to 600 mg every 8 hours and use Toradol prescribed by the emergency room as needed.  You will need to take it easy for the next few weeks to let this heal.  Will provide a note for you today for light duty for work for 1 week, but you would need to follow-up with your primary care doctor for any extension of this if needed.  For elevated liver enzymes, please go to [facility] to have your blood work done.  Avoid over-the-counter/herbal supplements.  Instructions for fatty liver: Recommend 1-2# weight loss per week until ideal body weight through exercise & diet. Low fat/cholesterol diet.   Avoid sweets, sodas, fruit juices, sweetened beverages like tea, etc. Gradually increase exercise from 15 min daily up to 1 hr per day 5 days/week. Avoid alcohol use.  Will refer you to healthy weight and wellness to help with weight loss.  I will plan to see back in the office in 3 months or sooner if needed.  Ermalinda Memos, PA-C [facility] Gastroenterology

## 2023-04-25 ENCOUNTER — Encounter (INDEPENDENT_AMBULATORY_CARE_PROVIDER_SITE_OTHER): Payer: Medicaid Other | Admitting: Adult Health

## 2023-04-29 ENCOUNTER — Encounter (INDEPENDENT_AMBULATORY_CARE_PROVIDER_SITE_OTHER): Payer: Self-pay

## 2023-05-15 ENCOUNTER — Encounter: Payer: Self-pay | Admitting: Gastroenterology

## 2023-06-07 ENCOUNTER — Ambulatory Visit: Admitting: Podiatry

## 2023-06-13 ENCOUNTER — Encounter (HOSPITAL_COMMUNITY): Payer: Self-pay

## 2023-06-13 ENCOUNTER — Emergency Department (HOSPITAL_COMMUNITY)
Admission: EM | Admit: 2023-06-13 | Discharge: 2023-06-13 | Disposition: A | Discharge status: Home / Self Care | Attending: Emergency Medicine | Admitting: Emergency Medicine

## 2023-06-13 ENCOUNTER — Other Ambulatory Visit: Payer: Self-pay

## 2023-06-13 ENCOUNTER — Emergency Department (HOSPITAL_COMMUNITY)

## 2023-06-13 DIAGNOSIS — R1010 Upper abdominal pain, unspecified: Secondary | ICD-10-CM | POA: Insufficient documentation

## 2023-06-13 DIAGNOSIS — D72829 Elevated white blood cell count, unspecified: Secondary | ICD-10-CM | POA: Insufficient documentation

## 2023-06-13 DIAGNOSIS — R000 Tachycardia, unspecified: Secondary | ICD-10-CM | POA: Insufficient documentation

## 2023-06-13 DIAGNOSIS — R112 Nausea with vomiting, unspecified: Secondary | ICD-10-CM | POA: Insufficient documentation

## 2023-06-13 DIAGNOSIS — R42 Dizziness and giddiness: Secondary | ICD-10-CM | POA: Insufficient documentation

## 2023-06-13 LAB — CBC
HCT: 46.4 % (ref 39.0–52.0)
Hemoglobin: 15.7 g/dL (ref 13.0–17.0)
MCH: 29.8 pg (ref 26.0–34.0)
MCHC: 33.8 g/dL (ref 30.0–36.0)
MCV: 88 fL (ref 80.0–100.0)
Platelets: 312 10*3/uL (ref 150–400)
RBC: 5.27 MIL/uL (ref 4.22–5.81)
RDW: 12.9 % (ref 11.5–15.5)
WBC: 13 10*3/uL — ABNORMAL HIGH (ref 4.0–10.5)
nRBC: 0 % (ref 0.0–0.2)

## 2023-06-13 LAB — COMPREHENSIVE METABOLIC PANEL WITH GFR
ALT: 55 U/L — ABNORMAL HIGH (ref 0–44)
AST: 71 U/L — ABNORMAL HIGH (ref 15–41)
Albumin: 3.8 g/dL (ref 3.5–5.0)
Alkaline Phosphatase: 62 U/L (ref 38–126)
Anion gap: 12 (ref 5–15)
BUN: 12 mg/dL (ref 6–20)
CO2: 26 mmol/L (ref 22–32)
Calcium: 9.4 mg/dL (ref 8.9–10.3)
Chloride: 100 mmol/L (ref 98–111)
Creatinine, Ser: 1.16 mg/dL (ref 0.61–1.24)
GFR, Estimated: >60 mL/min (ref >60)
Glucose, Bld: 101 mg/dL — ABNORMAL HIGH (ref 70–99)
Potassium: 4.2 mmol/L (ref 3.5–5.1)
Sodium: 138 mmol/L (ref 135–145)
Total Bilirubin: 0.6 mg/dL (ref 0.0–1.2)
Total Protein: 7.7 g/dL (ref 6.5–8.1)

## 2023-06-13 LAB — URINALYSIS, ROUTINE W REFLEX MICROSCOPIC
Bilirubin Urine: NEGATIVE
Glucose, UA: NEGATIVE mg/dL
Hgb urine dipstick: NEGATIVE
Ketones, ur: NEGATIVE mg/dL
Leukocytes,Ua: NEGATIVE
Nitrite: NEGATIVE
Protein, ur: NEGATIVE mg/dL
Specific Gravity, Urine: 1.042 — ABNORMAL HIGH (ref 1.005–1.030)
pH: 6 (ref 5.0–8.0)

## 2023-06-13 LAB — LIPASE, BLOOD: Lipase: 46 U/L (ref 11–51)

## 2023-06-13 LAB — RESP PANEL BY RT-PCR (RSV, FLU A&B, COVID)  RVPGX2
Influenza A by PCR: NEGATIVE
Influenza B by PCR: NEGATIVE
Resp Syncytial Virus by PCR: NEGATIVE
SARS Coronavirus 2 by RT PCR: NEGATIVE

## 2023-06-13 LAB — I-STAT CG4 LACTIC ACID, ED: Lactic Acid, Venous: 1.2 mmol/L (ref 0.5–1.9)

## 2023-06-13 MED ORDER — PANTOPRAZOLE SODIUM 40 MG IV SOLR
40.0000 mg | Freq: Once | INTRAVENOUS | Status: AC
  Administered 2023-06-13: 40 mg via INTRAVENOUS
  Filled 2023-06-13: qty 10

## 2023-06-13 MED ORDER — IOHEXOL 350 MG/ML SOLN
75.0000 mL | Freq: Once | INTRAVENOUS | Status: AC | PRN
  Administered 2023-06-13: 75 mL via INTRAVENOUS

## 2023-06-13 MED ORDER — ONDANSETRON 4 MG PO TBDP
4.0000 mg | ORAL_TABLET | Freq: Once | ORAL | Status: AC | PRN
  Administered 2023-06-13: 4 mg via ORAL
  Filled 2023-06-13: qty 1

## 2023-06-13 MED ORDER — SODIUM CHLORIDE 0.9 % IV BOLUS
1000.0000 mL | Freq: Once | INTRAVENOUS | Status: AC
  Administered 2023-06-13: 1000 mL via INTRAVENOUS

## 2023-06-13 MED ORDER — ONDANSETRON HCL 4 MG/2ML IJ SOLN
4.0000 mg | Freq: Once | INTRAMUSCULAR | Status: AC
  Administered 2023-06-13: 4 mg via INTRAVENOUS
  Filled 2023-06-13: qty 2

## 2023-06-13 MED ORDER — DROPERIDOL 2.5 MG/ML IJ SOLN
2.5000 mg | Freq: Once | INTRAMUSCULAR | Status: AC
  Administered 2023-06-13: 2.5 mg via INTRAVENOUS
  Filled 2023-06-13: qty 2

## 2023-06-13 MED ORDER — ONDANSETRON HCL 4 MG PO TABS: 4.0000 mg | ORAL_TABLET | Freq: Four times a day (QID) | ORAL | 0 refills | Status: AC

## 2023-06-13 NOTE — ED Provider Notes (Signed)
 [place] EMERGENCY DEPARTMENT AT [facility] Provider Note   CSN: [id number] Arrival date & time: 06/13/23  1635     History  Chief Complaint  Patient presents with   Emesis   Abdominal Pain   Syncopal Episode    Logan Dawson is a 28 y.o. male history of SAH, H. Pylori presented for nausea and vomiting for the past 5 days.  Patient states he has had his gallbladder taken out but that he has upper abdominal pain with this.  Patient has any alcohol use or fevers.  Patient states that when he was at work he stood up and got lightheaded in which he passed out and believes this is due to dehydration.  Patient denies any chest pain shortness of breath.  Patient denies any dysuria.  Patient denies any sick contacts.  Home Medications Prior to Admission medications   Medication Sig Start Date End Date Taking? Authorizing Provider  albuterol  (VENTOLIN  HFA) 108 (90 Base) MCG/ACT inhaler Inhale 1-2 puffs into the lungs every 6 (six) hours as needed for wheezing or shortness of breath. 01/21/12  Yes [provider]  budesonide-formoterol (SYMBICORT) 160-4.5 MCG/ACT inhaler Inhale 2 puffs into the lungs 2 (two) times daily.   Yes [provider]  colestipol  (COLESTID ) 1 g tablet Take 2 tablets (2 g total) by mouth 2 (two) times daily. 12/09/22  Yes Shana Daring S, PA-C  montelukast (SINGULAIR) 10 MG tablet Take 10 mg by mouth at bedtime.   Yes [provider]  ondansetron  (ZOFRAN ) 4 MG tablet Take 1 tablet (4 mg total) by mouth every 6 (six) hours. 06/13/23  Yes Lil Lepage, Arlin Benes, PA-C  oxyCODONE -acetaminophen  (PERCOCET/ROXICET) 5-325 MG tablet Take 1 tablet by mouth 4 (four) times daily.   Yes [provider]  WEGOVY  0.25 MG/0.5ML SOAJ Inject 0.25 mg into the skin every Monday.   Yes [provider]  ketorolac  (TORADOL ) 10 MG tablet Take 1 tablet (10 mg total) by mouth every 6 (six) hours as needed. Patient not taking: Reported on 06/13/2023  03/10/23   Carin Charleston, MD  pantoprazole  (PROTONIX ) 20 MG tablet Take 1 tablet (20 mg total) by mouth 2 (two) times daily. Patient not taking: Reported on 06/13/2023 04/15/21 06/13/23  Trish Furl, MD      Allergies    Sulfa antibiotics and Lisinopril    Review of Systems   Review of Systems  Gastrointestinal:  Positive for abdominal pain and vomiting.    Physical Exam Updated Vital Signs BP 121/66   Pulse (!) 108   Temp 97.9 F (36.6 C) (Oral)   Resp 20   Ht 6' (1.829 m)   Wt (!) 154.7 kg   SpO2 99%   BMI 46.25 kg/m  Physical Exam Vitals reviewed.  Constitutional:      General: He is not in acute distress. HENT:     Head: Normocephalic and atraumatic.  Eyes:     Extraocular Movements: Extraocular movements intact.     Conjunctiva/sclera: Conjunctivae normal.     Pupils: Pupils are equal, round, and reactive to light.  Cardiovascular:     Rate and Rhythm: Regular rhythm. Tachycardia present.     Pulses: Normal pulses.     Heart sounds: Normal heart sounds.     Comments: 2+ bilateral radial/dorsalis pedis pulses with regular rate Pulmonary:     Effort: Pulmonary effort is normal. No respiratory distress.     Breath sounds: Normal breath sounds.  Abdominal:  Palpations: Abdomen is soft.     Tenderness: There is abdominal tenderness in the right upper quadrant and epigastric area. There is no guarding or rebound. Negative signs include Rovsing's sign, McBurney's sign and psoas sign.  Musculoskeletal:        General: Normal range of motion.     Cervical back: Normal range of motion and neck supple.     Comments: 5 out of 5 bilateral grip/leg extension strength  Skin:    General: Skin is warm and dry.     Capillary Refill: Capillary refill takes less than 2 seconds.  Neurological:     General: No focal deficit present.     Mental Status: He is alert and oriented to person, place, and time.     Comments: Sensation intact in all 4 limbs  Psychiatric:        Mood and  Affect: Mood normal.     ED Results / Procedures / Treatments   Labs (all labs ordered are listed, but only abnormal results are displayed) Labs Reviewed  COMPREHENSIVE METABOLIC PANEL WITH GFR - Abnormal; Notable for the following components:      Result Value   Glucose, Bld 101 (*)    AST 71 (*)    ALT 55 (*)    All other components within normal limits  CBC - Abnormal; Notable for the following components:   WBC 13.0 (*)    All other components within normal limits  URINALYSIS, ROUTINE W REFLEX MICROSCOPIC - Abnormal; Notable for the following components:   Color, Urine STRAW (*)    Specific Gravity, Urine 1.042 (*)    All other components within normal limits  RESP PANEL BY RT-PCR (RSV, FLU A&B, COVID)  RVPGX2  LIPASE, BLOOD  RAPID URINE DRUG SCREEN, HOSP PERFORMED  CBG MONITORING, ED  I-STAT CG4 LACTIC ACID, ED  I-STAT CG4 LACTIC ACID, ED    EKG EKG Interpretation Date/Time:  Thursday Jun 13 2023 18:15:52 EDT Ventricular Rate:  104 PR Interval:  138 QRS Duration:  84 QT Interval:  332 QTC Calculation: 436 R Axis:   52  Text Interpretation: Sinus tachycardia Nonspecific ST abnormality Confirmed by Guadalupe Lee ([id number]) on 06/13/2023 7:42:55 PM  Radiology CT ABDOMEN PELVIS W CONTRAST Result Date: 06/13/2023 CLINICAL DATA:  Upper abdominal pain with nausea and vomiting. EXAM: CT ABDOMEN AND PELVIS WITH CONTRAST TECHNIQUE: Multidetector CT imaging of the abdomen and pelvis was performed using the standard protocol following bolus administration of intravenous contrast. RADIATION DOSE REDUCTION: This exam was performed according to the departmental dose-optimization program which includes automated exposure control, adjustment of the mA and/or kV according to patient size and/or use of iterative reconstruction technique. CONTRAST:  75mL OMNIPAQUE  IOHEXOL  350 MG/ML SOLN COMPARISON:  CT abdomen and pelvis 04/02/2023. CT abdomen and pelvis 10/21/2018. FINDINGS: Lower chest: There  is a 4 mm nodule left lung base unchanged in 2020 compatible with benign etiology. This is unchanged from prior. Hepatobiliary: No focal liver abnormality is seen. Status post cholecystectomy. No biliary dilatation. Pancreas: Unremarkable. No pancreatic ductal dilatation or surrounding inflammatory changes. Spleen: Normal in size without focal abnormality. Adrenals/Urinary Tract: Adrenal glands are unremarkable. Kidneys are normal, without renal calculi, focal lesion, or hydronephrosis. Bladder is unremarkable. Stomach/Bowel: Stomach is within normal limits. No evidence of bowel wall thickening, distention, or inflammatory changes. The appendix is not seen. There is sigmoid colon diverticulosis. Vascular/Lymphatic: No significant vascular findings are present. No enlarged abdominal or pelvic lymph nodes. Reproductive: Prostate is unremarkable. Other: No  abdominal wall hernia or abnormality. No abdominopelvic ascites. Musculoskeletal: No acute or significant osseous findings. IMPRESSION: 1. No acute localizing process in the abdomen or pelvis. 2. Sigmoid colon diverticulosis. Electronically Signed   By: Tyron Gallon M.D.   On: 06/13/2023 20:46    Procedures Procedures    Medications Ordered in ED Medications  ondansetron  (ZOFRAN -ODT) disintegrating tablet 4 mg (4 mg Oral Given 06/13/23 1809)  sodium chloride  0.9 % bolus 1,000 mL (1,000 mLs Intravenous New Bag/Given 06/13/23 1933)  pantoprazole  (PROTONIX ) injection 40 mg (40 mg Intravenous Given 06/13/23 1932)  iohexol  (OMNIPAQUE ) 350 MG/ML injection 75 mL (75 mLs Intravenous Contrast Given 06/13/23 2001)  ondansetron  (ZOFRAN ) injection 4 mg (4 mg Intravenous Given 06/13/23 2133)  droperidol  (INAPSINE ) 2.5 MG/ML injection 2.5 mg (2.5 mg Intravenous Given 06/13/23 2246)  sodium chloride  0.9 % bolus 1,000 mL (1,000 mLs Intravenous New Bag/Given 06/13/23 2246)    ED Course/ Medical Decision Making/ A&P                                 Medical Decision  Making Amount and/or Complexity of Data Reviewed Labs: ordered. Radiology: ordered.  Risk Prescription drug management.   Jasn C Saar 28 y.o. presented today for abdominal pain.  Working DDx that I considered at this time includes, but not limited to, gastroenteritis, colitis, small bowel obstruction, appendicitis, cholecystitis, hepatobiliary pathology, gastritis, PUD, ACS, aortic dissection, diverticulosis/diverticulitis, pancreatitis, nephrolithiasis, medication induced, AAA, UTI, pyelonephritis, testicular torsion.  R/o DDx: colitis, small bowel obstruction, appendicitis, cholecystitis, hepatobiliary pathology, gastritis, PUD, ACS, aortic dissection, diverticulosis/diverticulitis, pancreatitis, nephrolithiasis, medication induced, AAA, UTI, pyelonephritis, testicular torsion: These are considered less likely due to history of present illness, physical exam, labs/imaging findings.  Review of prior external notes: 06/04/2023 office visit  Unique Tests and My Independent Interpretation:  CBC with differential: Mild leukocytosis 13 CMP: Mild transaminitis 71/55 Lipase: Unremarkable UA: Unremarkable EKG: Rate, rhythm, axis, intervals all examined and without medically relevant abnormality. ST segments without concerns for elevations CT Abd/Pelvis with contrast: No acute pathology  Social Determinants of Health: none  Discussion with Independent Historian: None  Discussion of Management of Tests: None  Risk: Medium: prescription drug management  Risk Stratification Score: none  Plan: On exam patient was no acute distress with stable vitals.  On exam patient has upper abdominal tenderness without peritoneal signs.  Will get labs and give fluids along with medications for patient's symptoms and reevaluate.  Patient does have white count of 13 as of August CT scan as well as patient states he is vomiting up bile.  Patient's labs and imaging ultimately unremarkable.  Still waiting  respiratory panel however will p.o. challenge patient as I do believe he is viral syndrome causing his symptoms given reassuring labs and imaging.  If successful will discharge with Zofran  and have him follow-up with his primary care provider.  We discussed the importance of hydration and eating food as tolerated.  Patient failed p.o. challenge so we will give another round of Zofran  and try again shortly.  On patient patient continues to vomit after second round of Zofran .  Patient's QT is reassuring so we will do droperidol  and reevaluate.  If unable to tolerate p.o. with droperidol  will need to be admitted.  After the droperidol  patient said he felt much better and would like to go home.  Patient be able to keep down fluids and this time is asking to be discharged.  At this time patient stable to be discharged.  Patient was given return precautions. Patient stable for discharge at this time.  Patient verbalized understanding of plan.  This chart was dictated using voice recognition software.  Despite best efforts to proofread,  errors can occur which can change the documentation meaning.         Final Clinical Impression(s) / ED Diagnoses Final diagnoses:  Nausea and vomiting, unspecified vomiting type    Rx / DC Orders ED Discharge Orders          Ordered    ondansetron  (ZOFRAN ) 4 MG tablet  Every 6 hours        06/13/23 2113              Denese Finn, PA-C 06/13/23 2311    Guadalupe Lee, MD 06/14/23 1544

## 2023-06-13 NOTE — ED Triage Notes (Signed)
 Pt came in via POV d/t n/v the last 5 days. Pt reports that he was trying to work during this & had a syncopal episode yesterday, feeling very weak, breaking out in a rash all over his body & abd pain rated 6/10 during triage.

## 2023-06-13 NOTE — ED Notes (Signed)
 Pt tolerating po fluids, placed on cardiac monitor, Sinus tachycardia, abdominal pain 6/10 and nausea.

## 2023-06-13 NOTE — Discharge Instructions (Signed)
 Please follow-up with your primary care provider regards recent ER visit. Today your labs and imaging are reassuring most likely have a viral illness. Please use Tylenol or ibuprofen every 6 hours as needed for pain. I have prescribed Zofran in case you become nauseous. Please remain hydrated and eat food as tolerated. If symptoms change or worsen please return to the ER.

## 2023-07-16 NOTE — ED Provider Notes (Signed)
 ------------------------------------------------------------------------------- Attestation signed by Cherie Ardeen Hanger, MD at 07/16/23 1749 I was the attending physician on duty, and was available in Emergency Department for any consultations. I did not personally care for this patient. The patient was managed by the mid-level provider. I am co-signing the chart as the attending physician.   Signed by Ardeen SHAUNNA Cherie, MD July 16, 2023 at 5:49 PM  -------------------------------------------------------------------------------                                                                                     Emergency Department Provider Note    ED Clinical Impression   Final diagnoses:  Sunburn, blistering (Primary)    ED Assessment/Plan    Condition: Stable Disposition: Discharge  This chart has been completed using Dragon Medical Dictation software, and while attempts have been made to ensure accuracy, certain words and phrases may not be transcribed as intended.   History   Chief Complaint  Patient presents with  . Sunburn   HPI  Logan Dawson is a 28 y.o. male  who presents today to the  emergency department complaining of symptoms that started [place], [place].  He now complains of a painful burn to the chest bilateral shoulders and upper back, he has been using aloe that is not helping.  He denies any fever, chills, nausea or vomiting.     Allergies: is allergic to sulfa (sulfonamide antibiotics), lisinopril, and sulfamethoxazole. Medications: has a current medication list which includes the following long-term medication(s): albuterol , budesonide-formoterol, colestipol , famotidine , and metoprolol  succinate. PMHx:  has a past medical history of Anxiety, Erectile dysfunction, GERD (gastroesophageal reflux disease), Hypertension, Skull fracture (CMS-HCC), and Sleep apnea. PSHx:  has a past surgical history that includes  Cholecystectomy; Appendectomy; Abdominal surgery; and Hernia repair. SocHx:  reports that he has never smoked. He has never used smokeless tobacco. He reports that he does not currently use alcohol. He reports that he does not use drugs. Allergies, Medications, Medical, Surgical, and Social History were reviewed as documented above.   Social Drivers of Health with Concerns   Physical Activity: Insufficiently Active (07/16/2023)   Exercise Vital Sign   . Days of Exercise per Week: 4 days   . Minutes of Exercise per Session: 30 min  Social Connections: Moderately Isolated (07/16/2023)   Social Connection and Isolation Panel   . Frequency of Communication with Friends and Family: More than three times a week   . Frequency of Social Gatherings with Friends and Family: Twice a week   . Attends Religious Services: 1 to 4 times per year   . Active Member of Clubs or Organizations: No   . Attends Banker Meetings: Never   . Marital Status: Divorced  Insurance account manager: Internet connectivity concern identified (07/16/2023)   Insurance account manager   . Do you have access to internet services: No   . How do you connect to the internet: Not on file   . Is your internet connection strong enough for you to watch video on your device without major problems?: Not on file   . Do you have enough data to get through  the month?: Not on file   . Does at least one of the devices have a camera that you can use for video chat?: Not on file     Review Of Systems  Review of Systems  Constitutional:  Negative for chills and fever.  Respiratory:  Negative for shortness of breath.   Cardiovascular:  Negative for chest pain.  Gastrointestinal:  Negative for abdominal pain, diarrhea, nausea and vomiting.  Genitourinary:  Negative for dysuria.  Musculoskeletal:  Negative for back pain.  Skin:  Positive for color change.  Neurological:  Negative for dizziness and headaches.    Physical Exam    BP 112/78   Pulse 92   Temp 37.2 C (98.9 F) (Temporal)   Resp 18   Ht 182.9 cm (6')   Wt (!) 154.3 kg (340 lb 3.2 oz)   SpO2 99%   BMI 46.14 kg/m   Physical Exam Vitals and nursing note reviewed.  Constitutional:      Appearance: Normal appearance. He is obese.   Cardiovascular:     Rate and Rhythm: Normal rate and regular rhythm.     Heart sounds: Normal heart sounds.  Pulmonary:     Effort: Pulmonary effort is normal.     Breath sounds: Normal breath sounds.   Skin:    General: Skin is warm and dry.     Capillary Refill: Capillary refill takes 2 to 3 seconds.     Comments: Upper chest, shoulders and upper back have sunburn that blanches with pressure, there is blistering present, no drainage   Neurological:     General: No focal deficit present.     Mental Status: He is alert and oriented to person, place, and time.   Psychiatric:        Mood and Affect: Mood normal.        Behavior: Behavior normal.     ED Course  Medical Decision Making Risk OTC drugs. Prescription drug management.     Procedures   No results found for this visit on 07/16/23 (from the past 4464 hours).   ED Results No results found for any visits on 07/16/23. No results found.  Medications Administered:  Medications  ibuprofen  (MOTRIN ) tablet 600 mg (600 mg Oral Given 07/16/23 1336)  acetaminophen  (TYLENOL ) tablet 650 mg (650 mg Oral Given 07/16/23 1336)  lidocaine  (XYLOCAINE ) 2% viscous mucosal solution (15 mL Topical Given 07/16/23 1336)    Discharge Medications (Medications Prescribed during this  ED visit and Patient's Home Medications) :    Your Medication List     STOP taking these medications    cyclobenzaprine  10 MG tablet Commonly known as: FLEXERIL    DULoxetine 30 MG capsule Commonly known as: CYMBALTA   gabapentin 300 MG capsule Commonly known as: NEURONTIN   HYDROcodone -acetaminophen  10-325 mg per tablet Commonly known as: NORCO 10-325   methocarbamol   500 MG tablet Commonly known as: ROBAXIN        START taking these medications    bacitracin 500 unit/gram ointment Apply topically two (2) times a day.   lidocaine  4 % Spra Apply 1 Application topically every three (3) hours as needed (sunburn pain).       CHANGE how you take these medications    ibuprofen  600 MG tablet Commonly known as: MOTRIN  Take 1 tablet (600 mg total) by mouth every eight (8) hours as needed for pain. What changed:  medication strength how much to take when to take this       ASK your  doctor about these medications    albuterol  90 mcg/actuation inhaler Commonly known as: PROVENTIL  HFA;VENTOLIN  HFA Inhale 2 puffs every four (4) hours as needed for wheezing.   budesonide-formoterol 160-4.5 mcg/actuation inhaler Commonly known as: SYMBICORT Inhale 2 puffs two (2) times a day.   clindamycin 300 MG capsule Commonly known as: CLEOCIN Take 1 capsule (300 mg total) by mouth every six (6) hours.   colestipol  1 gram tablet Commonly known as: COLESTID  TAKE TWO (2) TABLETS BY MOUTH DAILY   famotidine  20 MG tablet Commonly known as: PEPCID  Take 1 tablet (20 mg total) by mouth Two (2) times a day for 15 days.   FLUoxetine  20 MG capsule Commonly known as: PROZAC  Take 1 capsule (20 mg total) by mouth daily.   metoPROLOL  succinate 50 MG 24 hr tablet Commonly known as: Toprol -XL Take 1 tablet (50 mg total) by mouth in the morning.   naproxen  500 MG tablet Commonly known as: NAPROSYN  Take 1 tablet (500 mg total) by mouth two (2) times a day as needed (pain).   oxyCODONE -acetaminophen  5-325 mg per tablet Commonly known as: PERCOCET Take 1 tablet by mouth Three (3) times a day as needed.   pantoprazole  40 MG tablet Commonly known as: Protonix  Take 1 tablet (40 mg total) by mouth daily.   PAXLOVID CO-PACK 300 mg (150 mg x 2)-100 mg tablet Generic drug: nirmatrelvir-ritonavir See package instructions.   sildenafil  100 MG tablet Commonly  known as: VIAGRA  1 tablet by mouth 1 hour prior to intercourse          Gerome Rosaline Ruth, [place] 07/16/23 1717

## 2023-07-25 ENCOUNTER — Ambulatory Visit

## 2023-07-31 ENCOUNTER — Ambulatory Visit (INDEPENDENT_AMBULATORY_CARE_PROVIDER_SITE_OTHER): Admitting: Podiatry

## 2023-07-31 ENCOUNTER — Encounter: Payer: Self-pay | Admitting: Podiatry

## 2023-07-31 DIAGNOSIS — L600 Ingrowing nail: Secondary | ICD-10-CM | POA: Diagnosis not present

## 2023-07-31 NOTE — Patient Instructions (Signed)

## 2023-08-01 NOTE — Progress Notes (Signed)
 Subjective:   Patient ID: Logan Dawson, male   DOB: 28 y.o.   MRN: [id number]   HPI Patient presents with painful chronic ingrown toenail deformity of both big toes and states that it has been an ongoing issue he tries to trim and soak them without relief and they are getting worse gradually as time goes on with patient found to have morbid obesity and does not smoke tries to be active   Review of Systems  All other systems reviewed and are negative.       Objective:  Physical Exam Vitals and nursing note reviewed.  Constitutional:      Appearance: He is well-developed.  Pulmonary:     Effort: Pulmonary effort is normal.   Musculoskeletal:        General: Normal range of motion.   Skin:    General: Skin is warm.   Neurological:     Mental Status: He is alert.     Neurovascular status found to be intact muscle strength was found to be adequate range of motion adequate with significant incurvation of hallux nails bilateral medial lateral borders painful to press inability to wear shoe gear with any degree of comfort.  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  I anesthetized each big toe 60 mg like a Marcaine mixture sterile prep done and using sterile instrumentation removed the medial and lateral borders exposed matrix applied phenol 3 applications 30 seconds followed by alcohol lavage sterile dressing gave instructions on soaks wear dressings 24 hours take them off earlier if throbbing were to occur with all questions answered today and encouraged him to call with any questions concerns during recovery

## 2023-09-28 NOTE — ED Provider Notes (Signed)
 Emergency Department Provider Note    ED Clinical Impression   Final diagnoses:  Encounter for piercing of tongue (Primary)    ED Assessment/Plan    Condition: Stable Disposition: Discharge  This chart has been completed using Dragon Medical Dictation software, and while attempts have been made to ensure accuracy, certain words and phrases may not be transcribed as intended.   History   Chief Complaint  Patient presents with  . Lip Laceration    Tongue pierced Tuesday.  Painful since then.     HPI  Logan Dawson is a 28 y.o. male  who presents today to the  emergency department complaining of pain in his tongue.  He says he got his tongue pierced 3 days ago and since then has had pain in his tongue.  Reports the ring is rubbing against the back of his gums and causing him pain.  Says he has not been eating because of the pain.  Somebody looked in his mouth and said that it they thought it looked infected.  He did noticed a small amount of drainage but denies fever, chills, nausea, vomiting    Allergies: is allergic to sulfa (sulfonamide antibiotics), lisinopril, and sulfamethoxazole. Medications: has a current medication list which includes the following long-term medication(s): albuterol , budesonide-formoterol, colestipol , famotidine , and metoprolol  succinate. PMHx:  has a past medical history of Anxiety, Erectile dysfunction, GERD (gastroesophageal reflux disease), Hypertension, Skull fracture (CMS-HCC), and Sleep apnea. PSHx:  has a past surgical history that includes Cholecystectomy; Appendectomy; Abdominal surgery; and Hernia repair. SocHx:  reports that he has never smoked. He has never used smokeless tobacco. He reports current alcohol use. He reports that he does not use drugs. Allergies, Medications, Medical, Surgical, and Social History were reviewed as documented  above.   Social Drivers of Health with Concerns   Physical Activity: Insufficiently Active (07/16/2023)   Exercise Vital Sign   . Days of Exercise per Week: 4 days   . Minutes of Exercise per Session: 30 min  Social Connections: Moderately Isolated (07/16/2023)   Social Connection and Isolation Panel   . Frequency of Communication with Friends and Family: More than three times a week   . Frequency of Social Gatherings with Friends and Family: Twice a week   . Attends Religious Services: 1 to 4 times per year   . Active Member of Clubs or Organizations: No   . Attends Banker Meetings: Never   . Marital Status: Divorced  Insurance account manager: Internet connectivity concern identified (07/16/2023)   Insurance account manager   . Do you have access to internet services: No   . How do you connect to the internet: Not on file   . Is your internet connection strong enough for you to watch video on your device without major problems?: Not on file   . Do you have enough data to get through the month?: Not on file   . Does at least one of the devices have a camera that you can use for video chat?: Not on file     Review Of Systems  Review of Systems  All other systems reviewed and are negative.   Physical Exam   BP 135/86   Pulse 90   Temp 36.3 C (97.3 F) (Temporal)   Resp 20  Ht 182.9 cm (6')   Wt (!) 152.5 kg (336 lb 3.2 oz)   SpO2 98%   BMI 45.60 kg/m   Physical Exam Vitals and nursing note reviewed.  Constitutional:      Appearance: Normal appearance.  HENT:     Head: Normocephalic.     Nose: Nose normal.     Mouth/Throat:     Mouth: Mucous membranes are moist.     Comments: Piercing in the distal aspect of tongue, no swelling or drainage noted.  No swelling to the throat, floor of mouth.  No pooling of secretions Eyes:     Conjunctiva/sclera: Conjunctivae normal.  Cardiovascular:     Rate and Rhythm: Normal rate.     Pulses: Normal pulses.  Pulmonary:      Effort: Pulmonary effort is normal.     Breath sounds: No stridor.  Musculoskeletal:     Cervical back: Normal range of motion.  Skin:    General: Skin is warm.     Capillary Refill: Capillary refill takes less than 2 seconds.  Neurological:     General: No focal deficit present.     Mental Status: He is alert.     ED Course  Medical Decision Making 28 year old male presents with pain after tongue piercing.  Reports the tongue was pierced 3 days ago.  He denies nausea, vomiting, fever but was concerned about infection.  There is no drainage noted no swelling to the tongue or throat.  I discussed with him about removing the tongue ring, will cover with antibiotics prophylactically.  Return instructions given.  Stable for outpatient follow-up     Procedures   No results found for this visit on 09/28/23 (from the past 4464 hours).   ED Results No results found for any visits on 09/28/23. No results found.  Medications Administered: Medications - No data to display  Discharge Medications (Medications Prescribed during this  ED visit and Patient's Home Medications) :    Your Medication List     CHANGE how you take these medications    clindamycin 300 MG capsule Commonly known as: CLEOCIN Take 1 capsule (300 mg total) by mouth Three (3) times a day for 7 days. What changed: when to take this       ASK your doctor about these medications    albuterol  90 mcg/actuation inhaler Commonly known as: PROVENTIL  HFA;VENTOLIN  HFA Inhale 2 puffs every four (4) hours as needed for wheezing.   bacitracin 500 unit/gram ointment Apply topically two (2) times a day.   budesonide-formoterol 160-4.5 mcg/actuation inhaler Commonly known as: SYMBICORT Inhale 2 puffs two (2) times a day.   colestipol  1 gram tablet Commonly known as: COLESTID  TAKE TWO (2) TABLETS BY MOUTH DAILY   famotidine  20 MG tablet Commonly known as: PEPCID  Take 1 tablet (20 mg total) by mouth Two (2)  times a day for 15 days.   FLUoxetine  20 MG capsule Commonly known as: PROZAC  Take 1 capsule (20 mg total) by mouth daily.   ibuprofen  600 MG tablet Commonly known as: MOTRIN  Take 1 tablet (600 mg total) by mouth every eight (8) hours as needed for pain.   lidocaine  4 % Spra Apply 1 Application topically every three (3) hours as needed (sunburn pain).   metoPROLOL  succinate 50 MG 24 hr tablet Commonly known as: Toprol -XL Take 1 tablet (50 mg total) by mouth in the morning.   naproxen  500 MG tablet Commonly known as: NAPROSYN  Take 1 tablet (500 mg total) by  mouth two (2) times a day as needed (pain).   oxyCODONE -acetaminophen  5-325 mg per tablet Commonly known as: PERCOCET Take 1 tablet by mouth Three (3) times a day as needed.   pantoprazole  40 MG tablet Commonly known as: Protonix  Take 1 tablet (40 mg total) by mouth daily.   PAXLOVID CO-PACK 300 mg (150 mg x 2)-100 mg tablet Generic drug: nirmatrelvir-ritonavir See package instructions.   sildenafil  100 MG tablet Commonly known as: VIAGRA  1 tablet by mouth 1 hour prior to intercourse          Peri Jackee Victory Mickey., MD 09/28/23 [phone]

## 2023-10-14 ENCOUNTER — Emergency Department (HOSPITAL_COMMUNITY)
Admission: EM | Admit: 2023-10-14 | Discharge: 2023-10-15 | Disposition: A | Discharge status: Home / Self Care | Attending: Emergency Medicine | Admitting: Emergency Medicine

## 2023-10-14 ENCOUNTER — Emergency Department (HOSPITAL_COMMUNITY)

## 2023-10-14 ENCOUNTER — Other Ambulatory Visit: Payer: Self-pay

## 2023-10-14 ENCOUNTER — Encounter (HOSPITAL_COMMUNITY): Payer: Self-pay | Admitting: Emergency Medicine

## 2023-10-14 DIAGNOSIS — R1084 Generalized abdominal pain: Secondary | ICD-10-CM | POA: Insufficient documentation

## 2023-10-14 DIAGNOSIS — E871 Hypo-osmolality and hyponatremia: Secondary | ICD-10-CM | POA: Insufficient documentation

## 2023-10-14 DIAGNOSIS — D72829 Elevated white blood cell count, unspecified: Secondary | ICD-10-CM | POA: Insufficient documentation

## 2023-10-14 LAB — CBC WITH DIFFERENTIAL/PLATELET
Abs Immature Granulocytes: 0.09 K/uL — ABNORMAL HIGH (ref 0.00–0.07)
Basophils Absolute: 0.1 K/uL (ref 0.0–0.1)
Basophils Relative: 1 %
Eosinophils Absolute: 0.1 K/uL (ref 0.0–0.5)
Eosinophils Relative: 1 %
HCT: 47 % (ref 39.0–52.0)
Hemoglobin: 15.9 g/dL (ref 13.0–17.0)
Immature Granulocytes: 1 %
Lymphocytes Relative: 25 %
Lymphs Abs: 3.3 K/uL (ref 0.7–4.0)
MCH: 29.6 pg (ref 26.0–34.0)
MCHC: 33.8 g/dL (ref 30.0–36.0)
MCV: 87.4 fL (ref 80.0–100.0)
Monocytes Absolute: 0.9 K/uL (ref 0.1–1.0)
Monocytes Relative: 7 %
Neutro Abs: 8.5 K/uL — ABNORMAL HIGH (ref 1.7–7.7)
Neutrophils Relative %: 65 %
Platelets: 304 K/uL (ref 150–400)
RBC: 5.38 MIL/uL (ref 4.22–5.81)
RDW: 13 % (ref 11.5–15.5)
WBC: 13 K/uL — ABNORMAL HIGH (ref 4.0–10.5)
nRBC: 0 % (ref 0.0–0.2)

## 2023-10-14 LAB — URINALYSIS, W/ REFLEX TO CULTURE (INFECTION SUSPECTED)
Bacteria, UA: NONE SEEN
Bilirubin Urine: NEGATIVE
Glucose, UA: NEGATIVE mg/dL
Hgb urine dipstick: NEGATIVE
Ketones, ur: NEGATIVE mg/dL
Leukocytes,Ua: NEGATIVE
Nitrite: NEGATIVE
Protein, ur: NEGATIVE mg/dL
Specific Gravity, Urine: 1.016 (ref 1.005–1.030)
pH: 6 (ref 5.0–8.0)

## 2023-10-14 LAB — COMPREHENSIVE METABOLIC PANEL WITH GFR
ALT: 39 U/L (ref 0–44)
AST: 60 U/L — ABNORMAL HIGH (ref 15–41)
Albumin: 3.9 g/dL (ref 3.5–5.0)
Alkaline Phosphatase: 73 U/L (ref 38–126)
Anion gap: 14 (ref 5–15)
BUN: 11 mg/dL (ref 6–20)
CO2: 26 mmol/L (ref 22–32)
Calcium: 9.1 mg/dL (ref 8.9–10.3)
Chloride: 93 mmol/L — ABNORMAL LOW (ref 98–111)
Creatinine, Ser: 0.88 mg/dL (ref 0.61–1.24)
GFR, Estimated: >60 mL/min (ref >60)
Glucose, Bld: 161 mg/dL — ABNORMAL HIGH (ref 70–99)
Potassium: 3.7 mmol/L (ref 3.5–5.1)
Sodium: 133 mmol/L — ABNORMAL LOW (ref 135–145)
Total Bilirubin: 0.4 mg/dL (ref 0.0–1.2)
Total Protein: 7.7 g/dL (ref 6.5–8.1)

## 2023-10-14 LAB — LIPASE, BLOOD: Lipase: 43 U/L (ref 11–51)

## 2023-10-14 MED ORDER — IOHEXOL 300 MG/ML  SOLN
100.0000 mL | Freq: Once | INTRAMUSCULAR | Status: AC | PRN
  Administered 2023-10-14: 100 mL via INTRAVENOUS

## 2023-10-14 MED ORDER — MORPHINE SULFATE (PF) 4 MG/ML IV SOLN
4.0000 mg | Freq: Once | INTRAVENOUS | Status: AC
  Administered 2023-10-14: 4 mg via INTRAVENOUS
  Filled 2023-10-14: qty 1

## 2023-10-14 MED ORDER — ONDANSETRON 4 MG PO TBDP: 4.0000 mg | ORAL_TABLET | Freq: Once | ORAL | Status: DC

## 2023-10-14 MED ORDER — LACTATED RINGERS IV BOLUS
1000.0000 mL | Freq: Once | INTRAVENOUS | Status: AC
Start: 2023-10-14 — End: 2023-10-15
  Administered 2023-10-14: 1000 mL via INTRAVENOUS

## 2023-10-14 NOTE — ED Notes (Signed)
 Pt not c/o nausea- will hold zofran  at this time

## 2023-10-14 NOTE — ED Provider Notes (Signed)
 [place] EMERGENCY DEPARTMENT AT [facility] Provider Note   CSN: [id number] Arrival date & time: 10/14/23  2121     History Chief Complaint  Patient presents with   Abdominal Pain    HPI: Logan Dawson is a 28 y.o. male with history pertinent elevated BMI, chronic abdominal pain, reported abdominal hernia who presents complaining of abdominal pain. Patient arrived via POV.  History provided by patient.  No interpreter required during this encounter.  Patient reports that his pain began at approximately 530 while he was at rest.  Denies any specific aggravating or alleviating factors.  Reports that he noted when he laid flat and tried to sit up (or otherwise flex his abdominal muscles were performed Valsalva maneuvers) he had some protrusion along his midline superior to his umbilicus, and when he pressed on this area he noticed some tenderness.  He became concerned that this was a hernia, therefore he wanted to have it evaluated.  Endorses nausea, denies vomiting.  Reports that he has been tolerating p.o., and most recently ate approximately 30 minutes prior to arrival to the ED.  Denies any dysuria, diarrhea, chest pain, shortness of breath, fever, chills  Patient's recorded medical, surgical, social, medication list and allergies were reviewed in the Snapshot window as part of the initial history.   Prior to Admission medications   Medication Sig Start Date End Date Taking? Authorizing Provider  albuterol  (VENTOLIN  HFA) 108 (90 Base) MCG/ACT inhaler Inhale 1-2 puffs into the lungs every 6 (six) hours as needed for wheezing or shortness of breath. 01/21/12   [provider]  budesonide-formoterol (SYMBICORT) 160-4.5 MCG/ACT inhaler Inhale 2 puffs into the lungs 2 (two) times daily.    [provider]  colestipol  (COLESTID ) 1 g tablet Take 2 tablets (2 g total) by mouth 2 (two) times daily. 12/09/22   Rudy Josette RAMAN, PA-C  ketorolac  (TORADOL ) 10 MG tablet  Take 1 tablet (10 mg total) by mouth every 6 (six) hours as needed. Patient not taking: Reported on 06/13/2023 03/10/23   Ula Prentice SAUNDERS, MD  montelukast (SINGULAIR) 10 MG tablet Take 10 mg by mouth at bedtime.    [provider]  ondansetron  (ZOFRAN ) 4 MG tablet Take 1 tablet (4 mg total) by mouth every 6 (six) hours. 06/13/23   Victor Lynwood DASEN, PA-C  oxyCODONE -acetaminophen  (PERCOCET/ROXICET) 5-325 MG tablet Take 1 tablet by mouth 4 (four) times daily.    [provider]  pantoprazole  (PROTONIX ) 20 MG tablet Take 1 tablet (20 mg total) by mouth 2 (two) times daily. 04/15/21 07/31/23  Randol Simmonds, MD  WEGOVY  0.25 MG/0.5ML EMMANUEL Inject 0.25 mg into the skin every Monday.    [provider]     Allergies: Sulfa antibiotics and Lisinopril   Review of Systems   ROS as per HPI  Physical Exam Updated Vital Signs BP 111/67   Pulse 92   Temp 98.4 F (36.9 C) (Oral)   Resp 17   Ht 6' (1.829 m)   Wt (!) 151.5 kg   SpO2 95%   BMI 45.30 kg/m  Physical Exam Vitals and nursing note reviewed.  Constitutional:      General: He is not in acute distress.    Appearance: Normal appearance. He is well-developed.  HENT:     Head: Normocephalic and atraumatic.  Eyes:     Extraocular Movements: Extraocular movements intact.     Conjunctiva/sclera: Conjunctivae normal.  Cardiovascular:     Rate and Rhythm: Normal rate  and regular rhythm.     Pulses: Normal pulses.     Heart sounds: No murmur heard. Pulmonary:     Effort: Pulmonary effort is normal. No respiratory distress.     Breath sounds: Normal breath sounds.  Abdominal:     Palpations: Abdomen is soft.     Tenderness: There is generalized abdominal tenderness (Moderate).     Hernia: A hernia is present. Hernia is present in the ventral area (Ventral hernia superior to the umbilicus with Valsalva maneuver, reduces spontaneously upon cessation of Valsalva).  Musculoskeletal:        General: No swelling.     Cervical back:  Neck supple.  Skin:    General: Skin is warm and dry.     Capillary Refill: Capillary refill takes less than 2 seconds.  Neurological:     General: No focal deficit present.     Mental Status: He is alert and oriented to person, place, and time.  Psychiatric:        Mood and Affect: Mood normal.     ED Course/ Medical Decision Making/ A&P    Procedures Procedures   Medications Ordered in ED Medications  morphine  (PF) 4 MG/ML injection 4 mg (4 mg Intravenous Given 10/14/23 2225)  lactated ringers  bolus 1,000 mL (0 mLs Intravenous Stopped 10/15/23 0021)  iohexol  (OMNIPAQUE ) 300 MG/ML solution 100 mL (100 mLs Intravenous Contrast Given 10/14/23 2310)    Medical Decision Making:   Logan Dawson is a 28 y.o. male who presents for abdominal pain as per above.  Physical exam is pertinent for epigastric ventral hernia on Valsalva maneuver, spontaneously reducing, mild diffuse abdominal tenderness to palpation.   The differential includes but is not limited to hernia, incarcerated hernia, strangulated hernia, pancreatitis, bowel obstruction, UTI, gastritis.  Independent historian: None  External data reviewed: No pertinent external data  Labs: Ordered, Independent interpretation, and Details: UA without evidence of UTI.  CBC with slight leukocytosis to 13 with left shift, no anemia or thrombocytopenia.  CMP without AKI, emergent electrolyte derangement, emergent LFT abnormality.  Lipase WNL.  Radiology: Ordered, Independent interpretation, Details: Personally reviewed CT, do not appreciate obstructive bowel gas pattern, free air, free fluid, persistently herniated loops of bowel, and All images reviewed independently.  Agree with radiology report at this time.   CT ABDOMEN PELVIS W CONTRAST Result Date: 10/14/2023 CLINICAL DATA:  Severe abdominal pain EXAM: CT ABDOMEN AND PELVIS WITH CONTRAST TECHNIQUE: Multidetector CT imaging of the abdomen and pelvis was performed using the standard  protocol following bolus administration of intravenous contrast. RADIATION DOSE REDUCTION: This exam was performed according to the departmental dose-optimization program which includes automated exposure control, adjustment of the mA and/or kV according to patient size and/or use of iterative reconstruction technique. CONTRAST:  OMNIPAQUE  IOHEXOL  300 MG/ML  SOLN COMPARISON:  CT 06/13/2023, 03/10/2023 FINDINGS: Lower chest: Lung bases are clear. Hepatobiliary: Liver slightly enlarged with craniocaudal measurement of 20 cm. Diffuse low density consistent with steatosis. Cholecystectomy. No biliary dilatation Pancreas: Unremarkable. No pancreatic ductal dilatation or surrounding inflammatory changes. Spleen: Normal in size without focal abnormality. Adrenals/Urinary Tract: Adrenal glands are normal. Kidneys show no hydronephrosis. The bladder is unremarkable Stomach/Bowel: The stomach is nonenlarged. There is no dilated small bowel. No acute bowel wall thickening. Vascular/Lymphatic: No significant vascular findings are present. No enlarged abdominal or pelvic lymph nodes. Reproductive: Prostate is unremarkable. Other: Negative for pelvic effusion or free air Musculoskeletal: No acute or suspicious osseous abnormality IMPRESSION: 1. No CT evidence  for acute intra-abdominal or pelvic abnormality. 2. Hepatomegaly with hepatic steatosis. Electronically Signed   By: Luke Bun M.D.   On: 10/14/2023 23:31    EKG/Medicine tests: Not indicated EKG Interpretation:                  Interventions: LR bolus, morphine   See the EMR for full details regarding lab and imaging results.  Patient presents with abdominal pain, is able to produce a ventral hernia on deliberate Valsalva maneuver, however no persistent herniation with relaxation concerning for incarcerated or strangulated hernia.  Patient's pain is primarily related to self palpation of the area.  Patient does initially appear to have significant pain  and has mild tachycardia.  Therefore do feel that given patient's complex surgical history, and tachycardia, labs, imaging are indicated.  LR bolus administered as well as morphine  with resolution of tachycardia and significant improvement in pain.  Labs with slight leukocytosis, likely stress to margination in the setting of pain.  However CT does not reveal evidence of persistent hernia, no evidence of acute intra-abdominal process.  Doubt pancreatitis given lipase, CT.  No evidence of UTI.  Patient exam reassuring on reevaluation.  Patient informed of hepatomegaly and hepatic steatosis, recommended follow-up with PCP.  Given patient with ventral hernia, will refer back to general surgery for discussion for possible repair, encourage patient to not deliberately reduce or palpate the hernia, additionally gave return precautions for incarceration and strangulation of hernia.  Patient expresses understanding.  Discharged in stable condition.  Presentation is most consistent with acute complicated illness and Current presentation is complicated by underlying chronic conditions  Discussion of management or test interpretations with external provider(s): Not indicated  Risk Drugs:Prescription drug management and Parenteral controlled substances  Disposition: DISCHARGE: I believe that the patient is safe for discharge home with outpatient follow-up. Patient was informed of all pertinent physical exam, laboratory, and imaging findings.  Patient's suspected etiology of their symptom presentation was discussed with the patient and all questions were answered. We discussed following up with PCP and general surgery. I provided thorough ED return precautions. The patient feels safe and comfortable with this plan.  MDM generated using voice dictation software and may contain dictation errors.  Please contact me for any clarification or with any questions.  Clinical Impression:  1. Generalized abdominal pain       Discharge   Final Clinical Impression(s) / ED Diagnoses Final diagnoses:  Generalized abdominal pain    Rx / DC Orders ED Discharge Orders          Ordered    Ambulatory referral to General Surgery        10/15/23 0017             Rogelia Jerilynn RAMAN, MD 10/16/23 0126

## 2023-10-14 NOTE — ED Triage Notes (Signed)
 Pt c/o protrusion of abd that he noticed today. Pt c/o severe abd pain.

## 2023-10-14 NOTE — ED Notes (Signed)
 Patient transported to CT

## 2023-10-15 ENCOUNTER — Telehealth: Payer: Self-pay | Admitting: Neurology

## 2023-10-15 NOTE — Discharge Instructions (Signed)
 Logan Dawson  Thank you for allowing us  to take care of you today.  You came to the Emergency Department today because you had pain in your abdomen and a protrusion of the midline of your upper abdomen when you bear down.  Your CT does not show any abnormalities inside your belly, and your labs are reassuring.  This abnormality when you bear down is your intestines pushing against a weak spot in your abdominal wall, this is a hernia, hernias are dangerous when they are out and you are unable to put them back in, or if they were out and unable to go back in and cutting off blood flow to the intestines.  There is no evidence of this on your exam.  We are giving you referral to follow-up with a general surgeon to evaluate this hernia.   To-Do: 1. Please follow-up with your primary doctor within 2 weeks / as soon as possible.   Please return to the Emergency Department or call 911 if you experience have worsening of your symptoms, or do not get better, chest pain, shortness of breath, severe or significantly worsening pain, high fever, severe confusion, pass out or have any reason to think that you need emergency medical care.   We hope you feel better soon.   Mitzie Later, MD Department of Emergency Medicine [facility]

## 2023-10-15 NOTE — Telephone Encounter (Signed)
 Patient called to schedule appointment to get CPAP supplies. Informed patient have not schedule initial CPAP. Patient hung up before scheduling appointment.

## 2023-10-17 ENCOUNTER — Ambulatory Visit (INDEPENDENT_AMBULATORY_CARE_PROVIDER_SITE_OTHER): Admitting: Adult Health

## 2023-10-17 ENCOUNTER — Encounter (INDEPENDENT_AMBULATORY_CARE_PROVIDER_SITE_OTHER): Payer: Self-pay | Admitting: Adult Health

## 2023-10-17 VITALS — BP 120/78 | HR 117 | Temp 98.4°F | Ht 69.0 in | Wt 338.0 lb

## 2023-10-17 DIAGNOSIS — K529 Noninfective gastroenteritis and colitis, unspecified: Secondary | ICD-10-CM

## 2023-10-17 DIAGNOSIS — E559 Vitamin D deficiency, unspecified: Secondary | ICD-10-CM

## 2023-10-17 DIAGNOSIS — Z6841 Body Mass Index (BMI) 40.0 and over, adult: Secondary | ICD-10-CM | POA: Diagnosis not present

## 2023-10-17 NOTE — Progress Notes (Signed)
 Office: [phone]  /  Fax: [phone]   Initial Visit    Logan Dawson was seen in clinic today to evaluate for obesity. He is interested in losing weight to improve overall health and reduce the risk of weight related complications. He presents today to review program treatment options, initial physical assessment, and evaluation.     He was referred by: Self-Referral  When asked what else they would like to accomplish? He states: Adopt a healthier eating pattern and lifestyle, Improve energy levels and physical activity, Improve existing medical conditions, Reduce number of medications, Improve quality of life, and Improve appearance  When asked how has your weight affected you? He states: Contributed to orthopedic problems or mobility issues, Having fatigue, Having poor endurance, Problems with eating patterns, and Has affected mood   Weight history: Weight gain over the last 4 years.  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  He has been on GLP-1 for 3 months, starting weight 350 lbs and current weight 338 lbs   Past medical history includes:   Past Medical History:  Diagnosis Date   ADHD (attention deficit hyperactivity disorder)    Anxiety    Asthma     Gastroenteritis    Hypertension    Left shoulder pain    Meningitis    Migraine    after skull fracture   Skull fracture (HCC)    Subarachnoid hemorrhage (HCC)      Objective    BP 120/78   Pulse (!) 117   Temp 98.4 F (36.9 C)   Ht 5' 9 (1.753 m)   Wt (!) 338 lb (153.3 kg)   SpO2 96%   BMI 49.91 kg/m  He was weighed on the bioimpedance scale: Body mass index is 49.91 kg/m.  Body Fat%:42.5, Visceral Fat Rating:27, Weight trend over the last 12 months: Decreasing  General:  Alert, oriented and cooperative. Patient is in no acute distress.  Respiratory: Normal respiratory effort, no problems with respiration noted   Gait: able to ambulate independently  Mental Status: Normal mood and affect. Normal behavior. Normal judgment and thought content.   DIAGNOSTIC DATA REVIEWED:  BMET    Component Value Date/Time   NA 133 (L) 10/14/2023 2227   K 3.7 10/14/2023 2227   CL 93 (L) 10/14/2023 2227   CO2 26 10/14/2023 2227   GLUCOSE 161 (H) 10/14/2023 2227   BUN 11 10/14/2023 2227   CREATININE 0.88 10/14/2023 2227   CALCIUM 9.1 10/14/2023 2227   GFRNONAA >60 10/14/2023 2227   GFRAA >60 08/12/2019 0451   No results found for: HGBA1C No results found for: INSULIN CBC    Component Value Date/Time   WBC 13.0 (H) 10/14/2023 2227   RBC 5.38 10/14/2023 2227   HGB 15.9 10/14/2023 2227   HCT 47.0 10/14/2023 2227   PLT 304 10/14/2023 2227   MCV 87.4  10/14/2023 2227   MCH 29.6 10/14/2023 2227   MCHC 33.8 10/14/2023 2227   RDW 13.0 10/14/2023 2227   Iron/TIBC/Ferritin/ %Sat No results found for: IRON, TIBC, FERRITIN, IRONPCTSAT Lipid Panel  No results found for: CHOL, TRIG, HDL, CHOLHDL, VLDL, LDLCALC, LDLDIRECT Hepatic Function Panel     Component Value Date/Time   PROT 7.7 10/14/2023 2227   ALBUMIN 3.9 10/14/2023 2227   AST 60 (H) 10/14/2023 2227   ALT 39 10/14/2023 2227   ALKPHOS 73 10/14/2023 2227   BILITOT 0.4 10/14/2023 2227   BILIDIR  0.2 04/15/2020 0705   IBILI 0.4 04/15/2020 0705      Component Value Date/Time   TSH 1.642 10/21/2018 1951     Assessment and Plan   Vitamin D deficiency  Chronic diarrhea  Morbid obesity (HCC)   Assessment and Plan          ESTABLISH WITH [facility]WW   Obesity Treatment / Action Plan:  Patient will work on garnering support from family and friends to begin weight loss journey. Will work on eliminating or reducing the presence of highly palatable, calorie dense foods in the home. Will complete provided nutritional and psychosocial assessment questionnaire before the next appointment. Will be scheduled for indirect calorimetry to determine resting energy expenditure in a fasting state.  This will allow us  to create a reduced calorie, high-protein meal plan to promote loss of fat mass while preserving muscle mass. Counseled on the health benefits of losing 5%-15% of total body weight. Was counseled on nutritional approaches to weight loss and benefits of reducing processed foods and consuming plant-based foods and high quality protein as part of nutritional weight management. Was counseled on pharmacotherapy and role as an adjunct in weight management.   Obesity Education Performed Today:  He was weighed on the bioimpedance scale and results were discussed and documented in the synopsis.  We discussed obesity as a disease and the importance of a more detailed evaluation of all the factors contributing to the disease.  We discussed the importance of long term lifestyle changes which include nutrition, exercise and behavioral modifications as well as the importance of customizing this to his specific health and social needs.  We discussed the benefits of reaching a healthier weight to alleviate the symptoms of existing conditions and reduce the risks of the biomechanical, metabolic and psychological effects of obesity.  We reviewed the four pillars of obesity medicine and importance  of using a multimodal approach.  We reviewed the basic principles in weight management.   Logan Dawson appears to be in the action stage of change and states they are ready to start intensive lifestyle modifications and behavioral modifications.  I have spent 25 minutes in the care of the patient today including: 2 minutes before the visit reviewing and preparing the chart. 20 minutes face-to-face assessing and reviewing listed medical problems as outlined in obesity care plan, providing nutritional and behavioral counseling on topics outlined in the obesity care plan, counseling regarding anti-obesity medication as outlined in obesity care plan, independently interpreting test results and goals of care, as described in assessment and plan, and reviewing and discussing biometric information and progress 3 minutes after the visit updating chart and documentation of encounter.  Reviewed by clinician on day of visit: allergies, medications, problem list, medical history, surgical history, family history, social history, and previous encounter notes pertinent to obesity diagnosis.  Patty Lopezgarcia d. Torry Istre, NP-C

## 2023-10-31 ENCOUNTER — Ambulatory Visit: Admitting: General Surgery

## 2023-12-13 ENCOUNTER — Telehealth: Payer: Self-pay | Admitting: Neurology

## 2023-12-13 NOTE — Telephone Encounter (Signed)
 Pt is asking for a Rx for CPAP supplies to be sent to [facility], he has moved his appointment from 04-2024 to Dec of this year

## 2023-12-16 NOTE — Telephone Encounter (Signed)
 After checking DPR a detailed vm was left explaining that no Rx will be written until appointment date.  It was relayed on vm that Logan Dawson is on wait list so if we can see Logan Dawson earlier then we can write needed Rx at that time only. Office # left if Logan Dawson has questions re: message left today re: conversation from 11-07

## 2023-12-18 NOTE — Progress Notes (Signed)
 [facility] [place]  Return Patient Visit Logan Dawson DOB: Jun 07, 1995  MRN: [id number] Visit Date: 12/18/2023  Encounter Provider: Laneta Tanda Agent, PA-C  Subjective Logan Dawson is a 28 y.o. male who presents for New Patient, Establish Care, and Hypertension (No Metolprolol in 4 months) History of Present Illness The patient is a 28 year old male who presents as a new patient for evaluation of abdominal pain, blood pressure management, fatigue, chest pain, and acid reflux.  History of ventral hernia repair - Patient is s/p robotic recurrent ventral hernia repair with mesh on 11/21/2023 with Dr. Katrinka.  Previously went to ER on 11/30/2023 due to concern for opening of midline wound and was provided reassurance and discharged with instructions for wound management.  He experienced erythema and some purulent drainage from midline incision and was prescribed doxycycline .  He was last seen in general surgery office on 12/11/2023 for postoperative visit.  He reported tolerating diet without nausea or vomiting with regular bowel movements.  No fevers or chills.  Physical exam noted Abdomen: Soft. Non-distended. There are 2 superficial openings of the incision. One is 1cm at the middle aspect of incision and the other is 1.5cm at the inferior aspect of the incision. There is a scant amount of purulent drainage. Mild erythema surrounding the inferior opening. Minimal tenderness at the midline incision. Other laparoscopic ports C/D/I and healing well. No evidence of hernia recurrence. - Patient previously noted an area on right lower abdomen that was noticed late 2023 that he felt was becoming larger.  Reported a dull aching pain after palpating the area.  Prior PCP did not appreciate an abdominal mass, rather noted abdominal scar and fatty area more pronounced.  Suspected from scar tissue.  Abdominal ultrasound was obtained on 07/04/2022 with no acute findings.  CT abdomen without  contrast on 01/30/2023 did not show any acute findings in the abdomen.  CT abdomen without contrast obtained on 03/07/2023 showed a small fat-containing umbilical hernia with no other abnormalities noted.  No abnormalities on CT with contrast on 10/14/2023. - Patient states he saw his surgeon today and was informed this lump on his abdomen is a normal variation due to scar tissue from prior appendectomy.  HYPERTENSION MANAGEMENT: He is here for hypertension follow-up. SYMPTOMS: Pertinent negatives - claudication, dyspnea, irregular heart beat, lower extremity edema, near-syncope, palpitations, and syncope. Endorses fatigue and chest pain periodically, which is not present when he is taking pantoprazole . HABITS: --Patient has not been following a reduced sodium diet. -- He is not getting adequate exercise. He is hoping to start walking soon (has to wait 6 months postoperatively) HOME BLOOD PRESSURE: does not check BP at home. MEDICATIONS: He is not taking antihypertensive medications correctly. He is prescribed metoprolol  succinate 50 mg daily. He has been out for about 4 months. RISK FACTORS: Smoking: no Exercise: no  Vitamin D deficiency -He is prescribed vitamin D 50,000 IU weekly. He is not taking this and has been out for about 4 months.  GERD/chest pain - He has chronic GERD and previously took pantoprazole  40 mg daily, which was effective for symptoms.  Not taking pantoprazole , he reports having chest pain that may occur midline, or in the right chest that is described as a squeezing.  This self resolves after about 30 to 45 seconds.  This does not occur daily.  Denies shortness of breath, dizziness, palpitations, diaphoresis, left arm pain, jaw pain, or any other associated symptoms when this occurs. - Reports that he has had  multiple EKGs in the ER due to the symptoms, which have all been normal.  He also reports he has seen a cardiologist in [place] and believes he had echo, which was normal.   He does endorse family history of heart disease on his mom side of the family.  In addition, brother had non-fatal MI around age 8 and has CHF now.  Patient has never seen cardiology himself.  OSA - He has severe OSA and uses CPAP nightly.  He feels this is helping, but he does endorse daytime fatigue.  He feels well rested upon waking, but he notes fatigue in the afternoon that worsens throughout the day.     Diet: Reducing soda intake, increasing water  consumption Coffee/Tea/Caffeine-containing Drinks: Previously consumed Celsius, now mainly water  and Propel Sleep: Uses CPAP machine at night for severe obstructive sleep apnea, sleeps at least 8 hours per night  PAST SURGICAL HISTORY: - Hernia surgery - Appendectomy  FAMILY HISTORY His half-brother has heart problems and had a heart attack at a young age, leading to congestive heart failure. His stepfather has heart problems. His grandmother recently died of a heart attack. Another grandmother died of cancer. His maternal grandfather died of a heart attack.  Behavioral Health Screening  Patient Health Questionnaire-2 Score: 0 (12/18/2023  4:31 PM)      Patient's Depression screening is Negative   Depression Plan: Normal/Negative Screening  Objective Blood pressure (!) 119/94, pulse 108, temperature 98.4 F (36.9 C), resp. rate 18, height 1.727 m (5' 8), weight (!) 156 kg (345 lb), SpO2 97%. Physical Exam Respiratory: Clear to auscultation, no wheezing, rales or rhonchi Cardiovascular: Regular rate and rhythm, no murmurs, rubs, or gallops  Physical Exam Constitutional:      General: He is not in acute distress.    Appearance: Normal appearance. He is obese. He is not ill-appearing or toxic-appearing.  HENT:     Head: Normocephalic and atraumatic.     Right Ear: External ear normal.     Left Ear: External ear normal.     Nose: Nose normal.  Eyes:     General:        Right eye: No discharge.        Left eye: No discharge.   Cardiovascular:     Rate and Rhythm: Normal rate and regular rhythm.     Heart sounds: Normal heart sounds. No murmur heard.    No friction rub. No gallop.  Pulmonary:     Effort: Pulmonary effort is normal. No respiratory distress.     Breath sounds: Normal breath sounds. No stridor. No wheezing, rhonchi or rales.  Musculoskeletal:     Cervical back: Normal range of motion.  Skin:    General: Skin is warm and dry.     Capillary Refill: Capillary refill takes less than 2 seconds.  Neurological:     General: No focal deficit present.     Mental Status: He is alert and oriented to person, place, and time. Mental status is at baseline.  Psychiatric:        Mood and Affect: Mood normal.        Behavior: Behavior normal.        Thought Content: Thought content normal.        Judgment: Judgment normal.     Medical History: Medical History[1]  Patient Active Problem List   Diagnosis Date Noted  . Otorrhea of right ear 11/13/2023  . Perforated ear drum, right 08/08/2023  . Perforated ear drum,  left 08/08/2023  . Otalgia, right ear 08/08/2023  . Acute otitis externa of right ear 07/26/2023  . Bilateral impacted cerumen 07/26/2023  . Perforated tympanic membrane, left 07/26/2023  . Asthma 04/25/2023  . Lung nodule, solitary 03/07/2021  . Unable to control anger 11/27/2012  . Hearing voices 11/27/2012  . Black-out (not amnesia) 11/27/2012  . Concussion with < 1 hr loss of consciousness 11/27/2012  . Subarachnoid hemorrhage (HCC) 11/27/2012  . Skull fracture with concussion (HCC) 11/27/2012  . SAH (subarachnoid hemorrhage) (HCC) 11/14/2012  . Abrasions of multiple sites 11/14/2012  . Linear skull fracture (HCC) 11/14/2012  . Trauma 11/14/2012   Current Medications:  Medications Ordered Prior to Encounter[2] Current Medications[3]  Allergies: Allergies[4]   Immunizations:  Immunization History  Administered Date(s) Administered  . DTaP vaccine(INFANRIX)6W-6Y 11/09/1994,  01/23/1995, 06/13/1995, 06/16/1996  . HPV, Quadrivalent 12/25/2012, 05/20/2013  . Hep B, Unspecified 09/10/1994, 11/09/1994, 06/13/1995  . Hepatitis A pediatric/adolescent (VAQTA PEDS) 1Y-18Y 06/10/2012, 12/23/2012  . HiB, Unspecified 11/09/1994, 01/23/1995, 06/13/1995, 06/16/1996  . IPV 11/09/1994, 01/23/1995, 06/16/1996  . Influenza, Injectable, Quadrivalent, Preservative Free 03/07/2021  . Influenza, split virus, trivalent, preservative 01/05/2010, 04/06/2015  . MMR 06/16/1996, 07/11/1999  . Meningococcal MCV4P 06/10/2012  . Moderna SARS-CoV-2 Primary Series 12+ yrs 02/04/2020  . Polio, Unspecified 11/09/1994, 01/23/1995, 06/16/1996  . TDAP VACCINE (BOOSTRIX,ADACEL) 7Y+ 05/17/2006, 02/22/2020, 12/11/2023    Surgical History- Surgical History[5]  Family history- Family History[6] Social history- Social History[7]   Labs: No results found for this or any previous visit (from the past week).    Assessment & Plan 1. Encounter for medical examination to establish care (Primary) 2. H/O ventral hernia repair - The ultrasound results were normal, suggesting the presence of scar tissue in the abdomen.  In addition, patient had normal follow-up with surgeon today with reassuring report.  3. Primary hypertension -Checking basic labs and restarting Toprol -XL today. - Comprehensive Metabolic Panel - CBC with Differential - metoprolol  succinate (TOPROL  XL) 50 mg 24 hr tablet; Take 1 tablet (50 mg total) by mouth daily.  Dispense: 90 tablet; Refill: 2  4. Severe obstructive sleep apnea -Continue using CPAP  5. Family history of myocardial infarction 6. Chest pain, unspecified type - Suspect that symptoms are related to GERD, as he states that he had resolution of chest pain symptoms previously taking pantoprazole  consistently.  However, in the setting of early MI in his brother, placing referral to cardiology for further evaluation/preventative cardiac care.  Referral placed to [facility] location. - Ambulatory referral to Cardiology; Future  7. Gastroesophageal reflux disease, unspecified whether esophagitis present - He will restart pantoprazole  40 mg once daily. A prescription for pantoprazole  40 mg once daily will be sent to the pharmacy. If chest pain persists despite pantoprazole  treatment, he is to contact my office. In case of sudden severe chest pain or shortness of breath that does not improve, he should seek immediate medical attention at the ER. - pantoprazole  (PROTONIX ) 40 mg EC tablet; Take 1 tablet (40 mg total) by mouth every morning before breakfast.  Dispense: 90 tablet; Refill: 3  8. Fatigue, unspecified type -Checking additional labs for underlying cause today.  Not currently taking vitamin D. - Vitamin D, 25-Hydroxy - Vitamin B12 - TSH With Reflex To Free T4 - Anemia Profile  9. Vitamin D deficiency - Vitamin D, 25-Hydroxy     Problem List Items Addressed This Visit   None Visit Diagnoses       Encounter for medical examination to establish  care    -  Primary     H/O ventral hernia repair         Primary hypertension       Relevant Medications   metoprolol  succinate (TOPROL  XL) 50 mg 24 hr tablet   Other Relevant Orders   Comprehensive Metabolic Panel   CBC with Differential     Severe obstructive sleep apnea         Family history of myocardial infarction       Relevant Orders   Ambulatory referral to Cardiology     Chest pain, unspecified type       Relevant Orders   Ambulatory referral to Cardiology     Gastroesophageal reflux disease, unspecified whether esophagitis present       Relevant Medications   pantoprazole  (PROTONIX ) 40 mg EC tablet     Fatigue, unspecified type       Relevant Orders   Vitamin D, 25-Hydroxy   Vitamin B12   TSH With Reflex To Free T4   Anemia Profile     Vitamin D deficiency       Relevant Orders   Vitamin D, 25-Hydroxy       Monitor: The problem is unchanged Evaluation: Labs/tests ordered,  see encounter summary Assessment/Treatment Follow up in 1 month  Please contact my office for worsening conditions or problems, and seek emergency medical treatment and/or call 911 if you or your family deems either necessary.  Patient Instructions  Treatment Plan: Restart metoprolol  succinate 50 mg/day for blood pressure management. Prescription sent to pharmacy. Restart pantoprazole  40 mg once daily for acid reflux. Prescription sent to pharmacy. Continue using CPAP machine for sleep apnea. Labs ordered to check vitamin D, B12, thyroid , and iron levels. Referral placed to [facility]. for further evaluation of chest pain due to to significant family history for heart disease.   Follow-Up Instructions: Follow up in 1 to 2 months. Seek immediate medical attention at the ER if experiencing sudden severe chest pain or shortness of breath that does not improve. Monitor symptoms and report any persistent chest pain despite pantoprazole  treatment.  Return for 1-2 mo f/u HTN/fatigue/chest pain/GERD; 3-6 mo CPE/fasting labs (CPE only, has Medicaid).  Portions of this note were created using DAX Copilot software. Although proofread, errors may be present.  I have personally spent 45 minutes involved in face-to-face and non-face-to-face activities for this patient on the day of the visit.  Professional time spent includes the following activities, in addition to those noted in the documentation:  - preparing to see the patient (e.g., review of recent and/or remote lab/imaging/study results, provider notes, and patient messages/phone calls available in current EMR, CareEverywhere, and scanned records) -obtaining and/or reviewing separately obtained history either through past provider notes, patient phone calls, and/or patient's family member(s)/caregiver(s) -performing a medically appropriate examination and/or evaluation -counseling and educating the patient/family/caregiver -ordering  medications, tests, or procedures -documenting clinical information in the electronic or other health record -reviewing most up to date studies or expert consensus guidelines for screening/diagnosing/treating pertinent conditions/symptoms -independently interpreting results (not separately reported) and communicating results to the patient/family/caregiver -care coordination (not separately reported) -referring and communicating with other health care professionals (when not separately reported)  Laneta Tanda Agent, PA-C 5:24 PM       [1] Past Medical History: Diagnosis Date  . ADHD (attention deficit hyperactivity disorder)   . Asthma, unspecified asthma severity, unspecified whether complicated, unspecified whether persistent    Med Hx: Asthma;   .  Headache(784.0)   . Hypertension   . Seasonal allergies   . Unable to control anger   [2] Current Outpatient Medications on File Prior to Visit  Medication Sig Dispense Refill  . albuterol  HFA (Ventolin  HFA) 90 mcg/actuation inhaler Inhale 2 puffs every 6 (six) hours as needed.    . bacitracin 500 unit/gram ointment Apply topically.    . budesonide-formoteroL (SYMBICORT;BREYNA) 160-4.5 mcg/actuation inhaler 2 puffs.    . cetirizine (ZyrTEC) 5 mg chewable tablet Take 5 mg by mouth Once Daily.    . cholecalciferol (VITAMIN D3) 1,250 mcg (50,000 unit) tablet Take by mouth.    . colestipol  (COLESTID ) 1 gram tablet Take 2 g by mouth.    . montelukast (SINGULAIR) 10 mg tablet Take 10 mg by mouth daily.    . oxyCODONE -acetaminophen  (PERCOCET) 5-325 mg per tablet Take 1 tablet by mouth 3 (three) times a day as needed.    . [DISCONTINUED] pantoprazole  (PROTONIX ) 40 mg EC tablet 1 TABLET 1/2 TO 1 HOUR BEFORE MORNING MEAL ORALLY ONCE A DAY 30 DAYS    . [DISCONTINUED] citalopram (CeleXA) 20 mg tablet Take 20 mg by mouth daily. (Patient not taking: Reported on 12/18/2023)    . [DISCONTINUED] FLUoxetine  (PROzac ) 20 mg capsule TAKE 1 CAPSULE BY  MOUTH EVERY DAY FOR 30 DAYS (Patient not taking: Reported on 12/18/2023)    . [DISCONTINUED] hydrOXYzine (ATARAX) 50 mg tablet Take 50 mg by mouth 3 (three) times a day as needed. (Patient not taking: Reported on 12/18/2023)    . [DISCONTINUED] ibuprofen  (MOTRIN ) 200 mg tablet Take 200 mg by mouth as needed (pain). (Patient not taking: Reported on 12/18/2023)    . [DISCONTINUED] metoprolol  succinate (TOPROL  XL) 50 mg 24 hr tablet Take 50 mg by mouth. (Patient not taking: Reported on 12/18/2023)    . [DISCONTINUED] Wegovy  0.5 mg/0.5 mL subcutaneous pen injector Inject 0.5 mL under the skin once a week. (Patient not taking: Reported on 12/18/2023)     No current facility-administered medications on file prior to visit.  [3]  Current Outpatient Medications:  .  albuterol  HFA (Ventolin  HFA) 90 mcg/actuation inhaler, Inhale 2 puffs every 6 (six) hours as needed., Disp: , Rfl:  .  bacitracin 500 unit/gram ointment, Apply topically., Disp: , Rfl:  .  budesonide-formoteroL (SYMBICORT;BREYNA) 160-4.5 mcg/actuation inhaler, 2 puffs., Disp: , Rfl:  .  cetirizine (ZyrTEC) 5 mg chewable tablet, Take 5 mg by mouth Once Daily., Disp: , Rfl:  .  cholecalciferol (VITAMIN D3) 1,250 mcg (50,000 unit) tablet, Take by mouth., Disp: , Rfl:  .  colestipol  (COLESTID ) 1 gram tablet, Take 2 g by mouth., Disp: , Rfl:  .  montelukast (SINGULAIR) 10 mg tablet, Take 10 mg by mouth daily., Disp: , Rfl:  .  oxyCODONE -acetaminophen  (PERCOCET) 5-325 mg per tablet, Take 1 tablet by mouth 3 (three) times a day as needed., Disp: , Rfl:  .  metoprolol  succinate (TOPROL  XL) 50 mg 24 hr tablet, Take 1 tablet (50 mg total) by mouth daily., Disp: 90 tablet, Rfl: 2 .  pantoprazole  (PROTONIX ) 40 mg EC tablet, Take 1 tablet (40 mg total) by mouth every morning before breakfast., Disp: 90 tablet, Rfl: 3 [4] Allergies Allergen Reactions  . Lisinopril Other (See Comments)    Unknown reaction, Other reaction(s): elevated BP, Unknown  reaction, , Other reaction(s): elevated BP  . Sulfa (Sulfonamide Antibiotics) Hives  . Sulfamethoxazole Rash  [5] Past Surgical History: Procedure Laterality Date  . APPENDECTOMY     Procedure: APPENDECTOMY  .  FOOT SURGERY Bilateral   . HERNIA REPAIR     Procedure: HERNIA REPAIR  [6] Family History Problem Relation Name Age of Onset  . COPD Mother    [7] Social History Socioeconomic History  . Marital status: Single  Tobacco Use  . Smoking status: Never    Passive exposure: Never  . Smokeless tobacco: Never  Vaping Use  . Vaping status: Never Used  Substance and Sexual Activity  . Alcohol use: Yes    Comment: occ  . Drug use: No   Social Drivers of Health   Food Insecurity: Low Risk  (12/18/2023)   Food vital sign   . Within the past 12 months, you worried that your food would run out before you got money to buy more: Never true   . Within the past 12 months, the food you bought just didn't last and you didn't have money to get more: Never true  Transportation Needs: No Transportation Needs (12/18/2023)   Transportation   . In the past 12 months, has lack of reliable transportation kept you from medical appointments, meetings, work or from getting things needed for daily living? : No  Safety: Low Risk  (12/18/2023)   Safety   . How often does anyone, including family and friends, physically hurt you?: Never   . How often does anyone, including family and friends, insult or talk down to you?: Never   . How often does anyone, including family and friends, threaten you with harm?: Never   . How often does anyone, including family and friends, scream or curse at you?: Never  Living Situation: Low Risk  (12/18/2023)   Living Situation   . What is your living situation today?: I have a steady place to live   . Think about the place you live. Do you have problems with any of the following? Choose all that apply:: None/None on this list

## 2023-12-19 ENCOUNTER — Emergency Department (HOSPITAL_COMMUNITY)

## 2023-12-19 ENCOUNTER — Other Ambulatory Visit: Payer: Self-pay

## 2023-12-19 ENCOUNTER — Emergency Department (HOSPITAL_COMMUNITY)
Admission: EM | Admit: 2023-12-19 | Discharge: 2023-12-19 | Disposition: A | Discharge status: Home / Self Care | Attending: Emergency Medicine | Admitting: Emergency Medicine

## 2023-12-19 ENCOUNTER — Encounter (HOSPITAL_COMMUNITY): Payer: Self-pay | Admitting: Emergency Medicine

## 2023-12-19 DIAGNOSIS — J45909 Unspecified asthma, uncomplicated: Secondary | ICD-10-CM | POA: Diagnosis not present

## 2023-12-19 DIAGNOSIS — D72829 Elevated white blood cell count, unspecified: Secondary | ICD-10-CM | POA: Diagnosis not present

## 2023-12-19 DIAGNOSIS — R0789 Other chest pain: Secondary | ICD-10-CM | POA: Insufficient documentation

## 2023-12-19 DIAGNOSIS — I10 Essential (primary) hypertension: Secondary | ICD-10-CM | POA: Diagnosis not present

## 2023-12-19 LAB — COMPREHENSIVE METABOLIC PANEL WITH GFR
ALT: 38 U/L (ref 0–44)
AST: 53 U/L — ABNORMAL HIGH (ref 15–41)
Albumin: 4.3 g/dL (ref 3.5–5.0)
Alkaline Phosphatase: 87 U/L (ref 38–126)
Anion gap: 11 (ref 5–15)
BUN: 11 mg/dL (ref 6–20)
CO2: 27 mmol/L (ref 22–32)
Calcium: 9.1 mg/dL (ref 8.9–10.3)
Chloride: 100 mmol/L (ref 98–111)
Creatinine, Ser: 0.73 mg/dL (ref 0.61–1.24)
GFR, Estimated: >60 mL/min (ref >60)
Glucose, Bld: 142 mg/dL — ABNORMAL HIGH (ref 70–99)
Potassium: 3.7 mmol/L (ref 3.5–5.1)
Sodium: 138 mmol/L (ref 135–145)
Total Bilirubin: 0.5 mg/dL (ref 0.0–1.2)
Total Protein: 7.6 g/dL (ref 6.5–8.1)

## 2023-12-19 LAB — CBC WITH DIFFERENTIAL/PLATELET
Abs Immature Granulocytes: 0.03 K/uL (ref 0.00–0.07)
Basophils Absolute: 0.1 K/uL (ref 0.0–0.1)
Basophils Relative: 0 %
Eosinophils Absolute: 0.1 K/uL (ref 0.0–0.5)
Eosinophils Relative: 1 %
HCT: 45.2 % (ref 39.0–52.0)
Hemoglobin: 15.5 g/dL (ref 13.0–17.0)
Immature Granulocytes: 0 %
Lymphocytes Relative: 22 %
Lymphs Abs: 2.8 K/uL (ref 0.7–4.0)
MCH: 29.6 pg (ref 26.0–34.0)
MCHC: 34.3 g/dL (ref 30.0–36.0)
MCV: 86.4 fL (ref 80.0–100.0)
Monocytes Absolute: 0.9 K/uL (ref 0.1–1.0)
Monocytes Relative: 7 %
Neutro Abs: 9.3 K/uL — ABNORMAL HIGH (ref 1.7–7.7)
Neutrophils Relative %: 70 %
Platelets: 281 K/uL (ref 150–400)
RBC: 5.23 MIL/uL (ref 4.22–5.81)
RDW: 12.8 % (ref 11.5–15.5)
WBC: 13.1 K/uL — ABNORMAL HIGH (ref 4.0–10.5)
nRBC: 0 % (ref 0.0–0.2)

## 2023-12-19 LAB — TSH: TSH: 2.01 u[IU]/mL (ref 0.350–4.500)

## 2023-12-19 LAB — TROPONIN T, HIGH SENSITIVITY: Troponin T High Sensitivity: <15 ng/L (ref 0–19)

## 2023-12-19 LAB — D-DIMER, QUANTITATIVE: D-Dimer, Quant: 0.81 ug{FEU}/mL — ABNORMAL HIGH (ref 0.00–0.50)

## 2023-12-19 LAB — MAGNESIUM: Magnesium: 2.2 mg/dL (ref 1.7–2.4)

## 2023-12-19 MED ORDER — IOHEXOL 350 MG/ML SOLN
75.0000 mL | Freq: Once | INTRAVENOUS | Status: AC | PRN
  Administered 2023-12-19: 75 mL via INTRAVENOUS

## 2023-12-19 MED ORDER — METOPROLOL SUCCINATE ER 50 MG PO TB24
50.0000 mg | ORAL_TABLET | Freq: Once | ORAL | Status: AC
  Administered 2023-12-19: 50 mg via ORAL
  Filled 2023-12-19: qty 1

## 2023-12-19 MED ORDER — KETOROLAC TROMETHAMINE 15 MG/ML IJ SOLN
15.0000 mg | Freq: Once | INTRAMUSCULAR | Status: AC
  Administered 2023-12-19: 15 mg via INTRAVENOUS
  Filled 2023-12-19: qty 1

## 2023-12-19 NOTE — ED Notes (Signed)
 Pt ambulatory to restroom

## 2023-12-19 NOTE — ED Provider Notes (Signed)
 [place] [facility]ERGENCY DEPARTMENT AT [facility] Provider Note  CSN: [id number] Arrival date & time: 12/19/23 1325  Chief Complaint(s) No chief complaint on file.  HPI Logan Dawson is a 28 y.o. male history of hypertension presenting to the emergency department with chest pain.  Reports that he is having chest pain since last 3 days.  Reports it is in the left side of his chest.  Reports that he has chronically elevated heart rate and was prescribed metoprolol  but has not taken it.  Has not noticed any specific palpitations.  No pleuritic pain.  No syncope.  No lightheadedness or dizziness.  Denies shortness of breath.  Denies any nausea or vomiting, fevers or chills.  Denies cough.  Did have hernia repair last month.  Denies any leg swelling or leg pain.  Patient also reports he has a laceration which occurred a few weeks ago to his dorsal right index finger.  He had stitches placed which have subsequently been removed but has had dehiscence of the wound whenever he moves his finger.  No fevers or chills   Past Medical History Past Medical History:  Diagnosis Date   ADHD (attention deficit hyperactivity disorder)    Anxiety    Asthma    Gastroenteritis    Hypertension    Left shoulder pain    Meningitis    Migraine    after skull fracture   Skull fracture (HCC)    Subarachnoid hemorrhage (HCC)    Patient Active Problem List   Diagnosis Date Noted   Spondylosis of lumbar spine 02/21/2022   Lumbar facet arthropathy 02/21/2022   Chronic diarrhea 06/29/2021   Seasonal allergic rhinitis due to pollen 06/22/2021   Lung nodule, solitary 03/07/2021   Primary hypertension 03/07/2021   Depression, major, single episode, mild 03/07/2021   GAD (generalized anxiety disorder) 03/07/2021   Hematochezia 02/20/2021   History of Helicobacter pylori infection 02/20/2021   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   Abdominal pain, chronic, epigastric 08/15/2020   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 Authorizing Provider  albuterol  (VENTOLIN  HFA) 108 (90 Base) MCG/ACT inhaler Inhale 1-2 puffs into the lungs every 6 (six) hours as needed for wheezing or shortness of breath. 01/21/12   [provider]  budesonide-formoterol (SYMBICORT) 160-4.5 MCG/ACT inhaler Inhale 2 puffs into the lungs 2 (two) times daily.    [provider]  Cholecalciferol 1.25 MG (50000 UT) TABS Take by mouth.    [provider]  colestipol  (COLESTID ) 1 g tablet Take 2 tablets (2 g total) by mouth 2 (two) times daily. 12/09/22   Rudy Josette RAMAN, PA-C  cyclobenzaprine  (FLEXERIL ) 10 MG tablet Take 10 mg by mouth 3 (three) times daily as needed. 09/29/23   [provider]  doxycycline  (VIBRA -TABS) 100 MG tablet Take 100 mg by mouth 2 (two) times daily. 12/11/23   [provider]  hydrOXYzine (ATARAX) 50 MG tablet Take 50 mg by mouth.  [provider]  ketorolac  (TORADOL ) 10 MG tablet Take 1 tablet (10 mg total) by mouth every 6 (six) hours as needed. Patient not taking: Reported on 10/17/2023 03/10/23   Ula Prentice SAUNDERS, MD  methocarbamol  (ROBAXIN ) 500 MG tablet Take 500 mg by mouth every 8 (eight) hours as needed for muscle spasms.    [provider]  montelukast (SINGULAIR) 10 MG tablet Take 10 mg by mouth at bedtime.    [provider]  ondansetron  (ZOFRAN ) 4 MG tablet Take 1 tablet (4 mg total) by mouth every 6 (six) hours. Patient not taking: Reported on 10/17/2023 06/13/23   Victor Lynwood DASEN, PA-C  oxyCODONE  (OXY IR/ROXICODONE ) 5 MG immediate release tablet Take 5 mg by mouth every 6 (six) hours as needed for moderate pain (pain score 4-6).    [provider]  oxyCODONE -acetaminophen  (PERCOCET) 7.5-325 MG tablet Take 1 tablet by mouth 4 (four) times daily as needed. 12/03/23   [provider]  pantoprazole  (PROTONIX ) 20 MG tablet Take 1 tablet (20 mg total) by mouth 2 (two) times daily. 04/15/21 07/31/23  Randol Simmonds, MD  semaglutide -weight management (WEGOVY ) 1 MG/0.5ML SOAJ SQ injection Inject 1 mg into the skin once a week.    [provider]  Vitamin D, Ergocalciferol, (DRISDOL) 1.25 MG (50000 UNIT) CAPS capsule Take 50,000 Units by mouth once a week. 09/29/23   [provider]                                                                                                                                    Past Surgical History Past Surgical History:  Procedure Laterality Date   APPENDECTOMY     BIOPSY  08/16/2020   Procedure: BIOPSY;  Surgeon: Eartha Angelia Sieving, MD;  Location: [facility];  Service: Gastroenterology;;   BIOPSY  08/25/2021   Procedure: BIOPSY;  Surgeon: Eartha Angelia Sieving, MD;  Location: [facility];  Service: Gastroenterology;;   CHOLECYSTECTOMY     COLONOSCOPY WITH PROPOFOL  N/A 08/25/2021   Procedure: COLONOSCOPY WITH PROPOFOL ;  Surgeon: Eartha Angelia Sieving, MD;  Location: [facility];  Service: Gastroenterology;  Laterality: N/A;  12:30 ASA 1   ESOPHAGOGASTRODUODENOSCOPY (EGD) WITH PROPOFOL  N/A 08/16/2020   Eartha, H pylori positive   HERNIA REPAIR     Family History Family History  Problem Relation Age of Onset   Diabetes Mother    COPD Mother    Diabetes Father    Hypertension Father    Heart disease Father    Colon cancer Neg  Hx     Social History Social History   Tobacco Use   Smoking status: Never   Smokeless tobacco: Never  Vaping Use   Vaping status: Never Used  Substance Use Topics   Alcohol use: Not Currently   Drug use: No   Allergies Sulfa antibiotics and Lisinopril  Review of Systems Review of Systems  All other  systems reviewed and are negative.   Physical Exam Vital Signs  I have reviewed the triage vital signs BP 110/74   Pulse (!) 103   Temp 97.7 F (36.5 C) (Oral)   Resp 16   Ht 5' 9 (1.753 m)   Wt (!) 158 kg   SpO2 93%   BMI 51.44 kg/m  Physical Exam Vitals and nursing note reviewed.  Constitutional:      General: He is not in acute distress.    Appearance: Normal appearance.  HENT:     Mouth/Throat:     Mouth: Mucous membranes are moist.  Eyes:     Conjunctiva/sclera: Conjunctivae normal.  Cardiovascular:     Rate and Rhythm: Normal rate and regular rhythm.  Pulmonary:     Effort: Pulmonary effort is normal. No respiratory distress.     Breath sounds: Normal breath sounds.  Abdominal:     General: Abdomen is flat.     Palpations: Abdomen is soft.     Tenderness: There is no abdominal tenderness.  Musculoskeletal:     Right lower leg: No edema.     Left lower leg: No edema.     Comments: Palpation of left upper chest reproduces pain  Skin:    General: Skin is warm and dry.     Capillary Refill: Capillary refill takes less than 2 seconds.     Comments: Old laceration to the dorsal aspect of the PIP joint of the right index finger without any focal erythema, warmth, swelling.  Neurological:     Mental Status: He is alert and oriented to person, place, and time. Mental status is at baseline.  Psychiatric:        Mood and Affect: Mood normal.        Behavior: Behavior normal.     ED Results and Treatments Labs (all labs ordered are listed, but only abnormal results are displayed) Labs Reviewed  COMPREHENSIVE METABOLIC PANEL WITH GFR - Abnormal; Notable  for the following components:      Result Value   Glucose, Bld 142 (*)    AST 53 (*)    All other components within normal limits  CBC WITH DIFFERENTIAL/PLATELET - Abnormal; Notable for the following components:   WBC 13.1 (*)    Neutro Abs 9.3 (*)    All other components within normal limits  D-DIMER, QUANTITATIVE - Abnormal; Notable for the following components:   D-Dimer, Quant 0.81 (*)    All other components within normal limits  TSH  MAGNESIUM   TROPONIN T, HIGH SENSITIVITY                                                                                                                          Radiology CT Angio Chest PE W and/or Wo Contrast Result Date: 12/19/2023 CLINICAL DATA:  Chest pain.  Concern for pulmonary embolism. EXAM: CT ANGIOGRAPHY CHEST WITH CONTRAST TECHNIQUE: Multidetector CT imaging of the chest was performed using the standard protocol during  bolus administration of intravenous contrast. Multiplanar CT image reconstructions and MIPs were obtained to evaluate the vascular anatomy. RADIATION DOSE REDUCTION: This exam was performed according to the departmental dose-optimization program which includes automated exposure control, adjustment of the mA and/or kV according to patient size and/or use of iterative reconstruction technique. CONTRAST:  75mL OMNIPAQUE  IOHEXOL  350 MG/ML SOLN COMPARISON:  Chest CT dated 02/24/2023 and radiograph dated 12/19/2023. FINDINGS: Cardiovascular: There is no cardiomegaly or pericardial effusion. The thoracic aorta is unremarkable. The origins of the great vessels of the aortic arch are patent. No pulmonary artery embolus identified. Mediastinum/Nodes: No hilar or mediastinal adenopathy. The esophagus is grossly unremarkable. No mediastinal fluid collection. Lungs/Pleura: There is a 4 mm left lower lobe subpleural nodule. Additional 4 mm nodule in the superior segment of the right lower lobe along the fissure. No focal consolidation, pleural  effusion or pneumothorax. The central airways are patent. Upper Abdomen: Fatty liver.  Cholecystectomy. Musculoskeletal: No acute osseous pathology. Review of the MIP images confirms the above findings. IMPRESSION: 1. No acute intrathoracic pathology. No CT evidence of pulmonary artery embolus. 2. Fatty liver. Electronically Signed   By: Vanetta Chou M.D.   On: 12/19/2023 17:22   DG Chest Port 1 View Result Date: 12/19/2023 EXAM: 1 VIEW(S) XRAY OF THE CHEST 12/19/2023 02:07:00 PM COMPARISON: Prior study dated 03/21/2023. CLINICAL HISTORY: CP CP FINDINGS: LUNGS AND PLEURA: No focal pulmonary opacity. No pleural effusion. No pneumothorax. HEART AND MEDIASTINUM: Stable cardiomediastinal silhouette. BONES AND SOFT TISSUES: No acute osseous abnormality. IMPRESSION: 1. No acute cardiopulmonary abnormality. Electronically signed by: Lynwood Seip MD 12/19/2023 02:47 PM EST RP Workstation: [id number]    Pertinent labs & imaging results that were available during my care of the patient were reviewed by me and considered in my medical decision making (see MDM for details).  Medications Ordered in ED Medications  ketorolac  (TORADOL ) 15 MG/ML injection 15 mg (has no administration in time range)  metoprolol  succinate (TOPROL -XL) 24 hr tablet 50 mg (50 mg Oral Given 12/19/23 1729)  iohexol  (OMNIPAQUE ) 350 MG/ML injection 75 mL (75 mLs Intravenous Contrast Given 12/19/23 1655)                                                                                                                                     Procedures Procedures  (including critical care time)  Medical Decision Making / ED Course   MDM:  28 year old presenting with chest pain.  Patient overall well-appearing.  Vitals notable for tachycardia.  Appears to be in sinus tachycardia.  Patient reports he takes metoprolol  for this but has not been taking it.  Pain is reproducible with palpation so highest concern for musculoskeletal cause.   Did have recent surgery so we will obtain D-dimer although lower concern for PE, patient is tachycardic.  Differential also includes ACS which is unlikely as the patient is quite young but will check troponin.  Low concern  for other process such as esophageal perforation or dissection.  Will reassess.  Clinical Course as of 12/19/23 1806  Thu Dec 19, 2023  1757 Labs notable for mild elevated D-dimer.  CT was obtained with no evidence of pulmonary embolism.  Troponin negative with more than 2 hours of symptoms.  Heart rate improved after receiving metoprolol .  Suspect symptoms most likely musculoskeletal in cause.  Feel patient should be stable for discharge to home. Will discharge patient to home. All questions answered. Patient comfortable with plan of discharge. Return precautions discussed with patient and specified on the after visit summary [WS]    Clinical Course User Index [WS] Francesca, Elsie CROME, MD     Additional history obtained: -Additional history obtained from family -External records from outside source obtained and reviewed including: Chart review including previous notes, labs, imaging, consultation notes including prior notes    Lab Tests: -I ordered, reviewed, and interpreted labs.   The pertinent results include:   Labs Reviewed  COMPREHENSIVE METABOLIC PANEL WITH GFR - Abnormal; Notable for the following components:      Result Value   Glucose, Bld 142 (*)    AST 53 (*)    All other components within normal limits  CBC WITH DIFFERENTIAL/PLATELET - Abnormal; Notable for the following components:   WBC 13.1 (*)    Neutro Abs 9.3 (*)    All other components within normal limits  D-DIMER, QUANTITATIVE - Abnormal; Notable for the following components:   D-Dimer, Quant 0.81 (*)    All other components within normal limits  TSH  MAGNESIUM   TROPONIN T, HIGH SENSITIVITY    Notable for mild leukocytosis, elevated d-dimer   EKG   EKG  Interpretation Date/Time:  Thursday December 19 2023 13:35:48 EST Ventricular Rate:  130 PR Interval:  138 QRS Duration:  100 QT Interval:  310 QTC Calculation: 456 R Axis:   60  Text Interpretation: Sinus tachycardia Low voltage, precordial leads Baseline wander in lead(s) III Confirmed by Francesca Elsie ([id number]) on 12/19/2023 3:04:17 PM         Imaging Studies ordered: I ordered imaging studies including CTA Chest  On my interpretation imaging demonstrates no PE  I independently visualized and interpreted imaging. I agree with the radiologist interpretation   Medicines ordered and prescription drug management: Meds ordered this encounter  Medications   metoprolol  succinate (TOPROL -XL) 24 hr tablet 50 mg   iohexol  (OMNIPAQUE ) 350 MG/ML injection 75 mL   ketorolac  (TORADOL ) 15 MG/ML injection 15 mg    -I have reviewed the patients home medicines and have made adjustments as needed  Social Determinants of Health:  Diagnosis or treatment significantly limited by social determinants of health: obesity   Reevaluation: After the interventions noted above, I reevaluated the patient and found that their symptoms have improved  Co morbidities that complicate the patient evaluation  Past Medical History:  Diagnosis Date   ADHD (attention deficit hyperactivity disorder)    Anxiety    Asthma    Gastroenteritis    Hypertension    Left shoulder pain    Meningitis    Migraine    after skull fracture   Skull fracture (HCC)    Subarachnoid hemorrhage (HCC)       Dispostion: Disposition decision including need for hospitalization was considered, and patient discharged from emergency department.    Final Clinical Impression(s) / ED Diagnoses Final diagnoses:  Atypical chest pain     This chart was dictated using voice recognition software.  Despite best efforts to proofread,  errors can occur which can change the documentation meaning.    Francesca Elsie CROME,  MD 12/19/23 [phone]

## 2023-12-19 NOTE — ED Triage Notes (Addendum)
 Pt in by [facility] from home for chest pain x3 days. Pt given 324 asa in route. Pt has not taking his metoprolol  x 3 months.

## 2023-12-19 NOTE — Discharge Instructions (Signed)
 Your testing in the emergency department is reassuring.  We did not see signs of a dangerous cause of your symptoms such as a heart attack or blood clot in your lungs.  We feel that it is safe to go home.  Please take Tylenol  (acetaminophen ) and Motrin  (ibuprofen ) for your symptoms at home.  You can take 1000 mg of Tylenol  every 6 hours and 600 mg of Motrin  every 6 hours as needed for your symptoms.  You can take these medicines together as needed, either at the same time, or alternating every 3 hours.  Please follow-up closely with your primary doctor and return if your symptoms worsen.

## 2023-12-19 NOTE — Telephone Encounter (Signed)
 Patient states that he can not get into portal and would like someone to call him about his results. Please advise.

## 2023-12-19 NOTE — Telephone Encounter (Signed)
 PATIENT REQ CALL BACK RE LABS .  CB #  [id number]

## 2023-12-19 NOTE — Progress Notes (Signed)
 Vitamin D is slightly low but does not require once a week vitamin D.  Please start OTC vitamin D3 1000 to 2000 IU daily.  White blood cell count is elevated.  Are you currently having any symptoms of burning with urination, new cough, fevers, or chills?  It looks like this has been a chronic issue over the last year for you.  If WBC remains elevated, I will put in a referral to a specialist for further evaluation.  Normal electrolytes and normal kidney function.  A liver test is mildly elevated but not to severe levels.  Normal thyroid  function and normal iron levels.  Vitamin B12 is slightly low.  Please start taking OTC vitamin B12 500 mcg daily sublingually.  The sublingual kind dissolves on the tongue and is best absorbed. Please call my office to schedule a 36-month nonfasting lab visit to recheck vitamin D and vitamin B12.

## 2023-12-27 ENCOUNTER — Other Ambulatory Visit: Payer: Self-pay | Admitting: Gastroenterology

## 2023-12-27 DIAGNOSIS — K529 Noninfective gastroenteritis and colitis, unspecified: Secondary | ICD-10-CM

## 2024-01-17 ENCOUNTER — Ambulatory Visit: Attending: Cardiology | Admitting: Cardiology

## 2024-02-03 NOTE — Progress Notes (Deleted)
 Logan Dawson

## 2024-02-04 ENCOUNTER — Ambulatory Visit: Admitting: Neurology

## 2024-02-20 ENCOUNTER — Ambulatory Visit: Admitting: Gastroenterology

## 2024-02-21 ENCOUNTER — Encounter: Payer: Self-pay | Admitting: Gastroenterology

## 2024-02-26 ENCOUNTER — Ambulatory Visit: Admitting: Physician Assistant

## 2024-02-28 ENCOUNTER — Ambulatory Visit: Admitting: Neurology

## 2024-02-28 ENCOUNTER — Encounter: Payer: Self-pay | Admitting: Neurology

## 2024-04-14 ENCOUNTER — Ambulatory Visit: Admitting: Neurology
